# Patient Record
Sex: Female | Born: 1950 | Race: Black or African American | Hispanic: No | Smoking: Former smoker
Health system: Southern US, Community
[De-identification: ages and names within clinical notes are randomized; demographics above are authoritative.]

## PROBLEM LIST (undated history)

## (undated) DIAGNOSIS — J189 Pneumonia, unspecified organism: Secondary | ICD-10-CM

## (undated) DIAGNOSIS — K5909 Other constipation: Secondary | ICD-10-CM

## (undated) DIAGNOSIS — J45909 Unspecified asthma, uncomplicated: Secondary | ICD-10-CM

## (undated) DIAGNOSIS — I493 Ventricular premature depolarization: Secondary | ICD-10-CM

## (undated) DIAGNOSIS — E039 Hypothyroidism, unspecified: Secondary | ICD-10-CM

## (undated) DIAGNOSIS — D510 Vitamin B12 deficiency anemia due to intrinsic factor deficiency: Secondary | ICD-10-CM

## (undated) DIAGNOSIS — K219 Gastro-esophageal reflux disease without esophagitis: Secondary | ICD-10-CM

## (undated) DIAGNOSIS — I10 Essential (primary) hypertension: Secondary | ICD-10-CM

## (undated) DIAGNOSIS — I4510 Unspecified right bundle-branch block: Secondary | ICD-10-CM

## (undated) DIAGNOSIS — K759 Inflammatory liver disease, unspecified: Secondary | ICD-10-CM

## (undated) DIAGNOSIS — Z9289 Personal history of other medical treatment: Secondary | ICD-10-CM

## (undated) DIAGNOSIS — M069 Rheumatoid arthritis, unspecified: Secondary | ICD-10-CM

## (undated) DIAGNOSIS — Z9889 Other specified postprocedural states: Secondary | ICD-10-CM

## (undated) HISTORY — DX: Ventricular premature depolarization: I49.3

## (undated) HISTORY — DX: Other specified postprocedural states: Z98.890

## (undated) HISTORY — DX: Vitamin B12 deficiency anemia due to intrinsic factor deficiency: D51.0

## (undated) HISTORY — DX: Hypothyroidism, unspecified: E03.9

## (undated) HISTORY — PX: BACK SURGERY: SHX140

## (undated) HISTORY — DX: Unspecified right bundle-branch block: I45.10

## (undated) HISTORY — PX: THYROIDECTOMY: SHX17

## (undated) HISTORY — PX: ABDOMINAL HYSTERECTOMY: SHX81

## (undated) HISTORY — DX: Essential (primary) hypertension: I10

## (undated) HISTORY — DX: Other constipation: K59.09

## (undated) HISTORY — DX: Personal history of other medical treatment: Z92.89

---

## 2000-12-01 ENCOUNTER — Other Ambulatory Visit: Admission: RE | Admit: 2000-12-01 | Discharge: 2000-12-01 | Payer: Self-pay | Admitting: Specialist

## 2000-12-06 ENCOUNTER — Encounter: Payer: Self-pay | Admitting: Specialist

## 2000-12-06 ENCOUNTER — Ambulatory Visit (HOSPITAL_COMMUNITY): Admission: RE | Admit: 2000-12-06 | Discharge: 2000-12-06 | Payer: Self-pay | Admitting: *Deleted

## 2000-12-22 ENCOUNTER — Ambulatory Visit (HOSPITAL_COMMUNITY): Admission: RE | Admit: 2000-12-22 | Discharge: 2000-12-22 | Payer: Self-pay | Admitting: Specialist

## 2000-12-22 ENCOUNTER — Encounter: Payer: Self-pay | Admitting: Specialist

## 2001-01-05 ENCOUNTER — Inpatient Hospital Stay (HOSPITAL_COMMUNITY): Admission: RE | Admit: 2001-01-05 | Discharge: 2001-01-07 | Payer: Self-pay | Admitting: Specialist

## 2002-12-06 ENCOUNTER — Encounter: Payer: Self-pay | Admitting: Family Medicine

## 2002-12-06 ENCOUNTER — Ambulatory Visit (HOSPITAL_COMMUNITY): Admission: RE | Admit: 2002-12-06 | Discharge: 2002-12-06 | Payer: Self-pay | Admitting: Family Medicine

## 2002-12-18 ENCOUNTER — Ambulatory Visit (HOSPITAL_COMMUNITY): Admission: RE | Admit: 2002-12-18 | Discharge: 2002-12-18 | Payer: Self-pay | Admitting: Family Medicine

## 2002-12-18 ENCOUNTER — Encounter: Payer: Self-pay | Admitting: Family Medicine

## 2002-12-25 ENCOUNTER — Ambulatory Visit (HOSPITAL_COMMUNITY): Admission: RE | Admit: 2002-12-25 | Discharge: 2002-12-25 | Payer: Self-pay | Admitting: Internal Medicine

## 2003-05-02 ENCOUNTER — Ambulatory Visit (HOSPITAL_COMMUNITY): Admission: RE | Admit: 2003-05-02 | Discharge: 2003-05-02 | Payer: Self-pay | Admitting: Unknown Physician Specialty

## 2003-08-16 ENCOUNTER — Encounter (HOSPITAL_COMMUNITY): Admission: RE | Admit: 2003-08-16 | Discharge: 2003-09-15 | Payer: Self-pay | Admitting: Unknown Physician Specialty

## 2004-03-19 ENCOUNTER — Ambulatory Visit (HOSPITAL_COMMUNITY): Admission: RE | Admit: 2004-03-19 | Discharge: 2004-03-19 | Payer: Self-pay | Admitting: Obstetrics and Gynecology

## 2004-07-09 ENCOUNTER — Ambulatory Visit (HOSPITAL_COMMUNITY): Admission: RE | Admit: 2004-07-09 | Discharge: 2004-07-09 | Payer: Self-pay | Admitting: Family Medicine

## 2005-03-23 ENCOUNTER — Ambulatory Visit (HOSPITAL_COMMUNITY): Admission: RE | Admit: 2005-03-23 | Discharge: 2005-03-23 | Payer: Self-pay | Admitting: Obstetrics and Gynecology

## 2006-04-19 ENCOUNTER — Ambulatory Visit (HOSPITAL_COMMUNITY): Admission: RE | Admit: 2006-04-19 | Discharge: 2006-04-19 | Payer: Self-pay | Admitting: Internal Medicine

## 2007-06-01 ENCOUNTER — Ambulatory Visit (HOSPITAL_COMMUNITY): Admission: RE | Admit: 2007-06-01 | Discharge: 2007-06-01 | Payer: Self-pay | Admitting: Obstetrics and Gynecology

## 2007-08-21 ENCOUNTER — Emergency Department (HOSPITAL_COMMUNITY): Admission: EM | Admit: 2007-08-21 | Discharge: 2007-08-21 | Payer: Self-pay | Admitting: Emergency Medicine

## 2008-06-20 ENCOUNTER — Ambulatory Visit (HOSPITAL_COMMUNITY): Admission: RE | Admit: 2008-06-20 | Discharge: 2008-06-20 | Payer: Self-pay | Admitting: Obstetrics and Gynecology

## 2009-06-24 ENCOUNTER — Ambulatory Visit (HOSPITAL_COMMUNITY): Admission: RE | Admit: 2009-06-24 | Discharge: 2009-06-24 | Payer: Self-pay | Admitting: Obstetrics and Gynecology

## 2010-01-13 ENCOUNTER — Encounter (INDEPENDENT_AMBULATORY_CARE_PROVIDER_SITE_OTHER): Payer: Self-pay

## 2010-04-06 ENCOUNTER — Encounter: Payer: Self-pay | Admitting: Surgery

## 2010-04-15 NOTE — Letter (Signed)
Summary: Recall, Screening Colonoscopy Only  [facility]  [address]   [place], [place] [address]   Phone: [phone]  Fax: [phone]    January 13, 2010  Emily Valentine [address] [place], [place]  [address] 1950-05-10   Dear [name],   Our records indicate it is time to schedule your colonoscopy.    Please call our office at [phone] and ask for the nurse.   Thank you,  Hendricks Limes, LPN Cloria Spring, LPN  [facility] Ph: [phone]   Fax: [phone]

## 2010-05-19 ENCOUNTER — Other Ambulatory Visit: Payer: Self-pay | Admitting: Obstetrics and Gynecology

## 2010-05-19 DIAGNOSIS — Z139 Encounter for screening, unspecified: Secondary | ICD-10-CM

## 2010-06-30 ENCOUNTER — Ambulatory Visit (HOSPITAL_COMMUNITY): Payer: BC Managed Care – PPO

## 2010-08-01 NOTE — Op Note (Signed)
[facility]  Patient:    Emily Valentine, BRONER Visit Number: [id number] MRN: [id number]          Service Type: SUR Location: 4A [id number] [id number] Attending Physician:  Kirtland Bouchard Dictated by:   Belia Heman. Kate Sable, M.D. Proc. Date: 01/05/01 Admit Date:  01/05/2001   CC:         Christin Bach, M.D.   Operative Report  PREOPERATIVE DIAGNOSIS:  Fibroid uterus.  POSTOPERATIVE DIAGNOSIS:  Fibroid uterus.  PROCEDURE:  Total abdominal hysterectomy and bilateral salpingo-oophorectomy.  SURGEON:  Belia Heman. Kate Sable, M.D.  ASSISTANT:  Christin Bach, M.D.  ANESTHESIA:  General.  DESCRIPTION OF THE OPERATIVE PROCEDURE:  Following induction of general anesthesia, the patient was frog-legged and the vagina prepped.  A Foley catheter was placed in the bladder.  The patient was repositioned in a supine position and suitably prepped and draped for a midline incision.  The abdomen was opened through a lower abdominal midline incision.  The upper abdomen was explored with negative findings.  Exploration of the pelvis revealed an 8-week-gestational-size fibroid uterus.  The ovaries appeared atrophic and there were no lesions.  The tubes appeared normal.  The peritoneal surfaces were clean.  There was no evidence of pelvic neoplasia.  The bowel was packed off and a Balfour retractor placed.  The round ligaments were sutured with 0 chromic suture and divided bilaterally.  The uterovesical fold of peritoneum was incised transversely and the bladder flap developed with sharp dissection. The left infundibulopelvic ligament was isolated above the ureter, clamped, cut and doubly ligated with 0 chromic suture; the identical procedure was carried out on the opposite side.  The ureter was identified on both sides. The anterior and posterior leaves of the broad ligament were incised, skeletonizing the uterine vessels, which were clamped, cut and suture-ligated with 0 chromic  suture bilaterally.  The cardinal ligaments were clamped, cut and suture-ligated with 0 chromic suture medial to the uterine pedicles. Dissection was carried down to the level of the cervicovaginal junction, with each pedicle being clamped, cut and suture-ligated with 0 chromic suture bilaterally.  The vagina was entered anteriorly and the specimen severed from the vaginal cuff.  The vaginal cuff was closed vertically because of a deep cul-de-sac and a concern over future enterocele.  The uterosacral ligaments were plicated together.  The pelvis was irrigated with normal saline and small oozing areas controlled with individual sutures and cautery. Peritonealization was carried out with a running 2-0 chromic suture.  There was still a small ooze from the raw surface of the posterior closed vagina.  A piece of Surgicel was placed over this area and the bowel allowed to follow into place.  The appendix was mobilized, inspected, and not removed; the appendix appeared normal.  The peritoneum was closed with a running 2-0 chromic suture, the fascia was closed with two segmentally run 0 PDS sutures tied together in the midline, the subcutaneous layer was irrigated with normal saline and the skin closed with skin staples.  Estimated blood loss was 100 cc.  All sponge, needle and instrument counts were reported correct.  Patient tolerated the procedure well and left the operating room in satisfactory postoperative condition. Dictated by:   Belia Heman. Kate Sable, M.D. Attending Physician:  Kirtland Bouchard DD:  01/05/01 TD:  01/06/01 Job: [id number]

## 2010-08-01 NOTE — Discharge Summary (Signed)
[facility]  Patient:    Emily Valentine, Emily Valentine Visit Number: [id number] MRN: [id number]          Service Type: SUR Location: 4A A419 01 Attending Physician:  Micael Hampshire Dictated by:   Belia Heman. Kate Sable, M.D. Admit Date:  01/05/2001 Discharge Date: 01/07/2001                             Discharge Summary  HISTORY OF PRESENT ILLNESS:  This 60 year old married black female, gravida 3, para 3, was admitted with uterine fibroids, bleeding, and anemia for surgery. The details of the patients history and physical examination are documented in the typed admission note.  OPERATIVE PROCEDURES:  January 05, 2001, total abdominal hysterectomy, bilateral salpingo-oophorectomy.  At surgery an [redacted] week gestational sized fibroid uterus was found.  Pathology report:  Subserosal intramural and submucosal leiomyomata and adenomyosis, ovaries right and left, and no histologic abnormalities.  HOSPITAL COURSE:  The patients postoperative course was afebrile and uneventful.  The patient had no significant drop in her hematocrit which was 28% on the first postoperative day.  On the second postoperative day the patient was afebrile and tolerating a regular diet.  The incision showed evidence of good wound healing.  The patient had not had a bowel movement but was a given a Dulcolax suppository and discharged home.  FINAL DIAGNOSES: 1. leiomyomata uteri 2. Bleeding with anemia secondary to fibroids.  DISPOSITION:  Discharge home.  FOLLOWUP:  Office follow up in 1 week for skin staple removal.  DISCHARGE MEDICATIONS: 1. Tylox #15. 2. Motrin 800, #30. 3. Estradiol 1 mg #30. Dictated by:   Belia Heman. Kate Sable, M.D. Attending Physician:  Micael Hampshire DD:  02/23/01 TD:  02/23/01 Job: [id number]

## 2010-08-01 NOTE — Op Note (Signed)
[name], Emily Valentine                         ACCOUNT NO.:  [id number]   MEDICAL RECORD NO.:  [id number]                   PATIENT TYPE:  AMB   LOCATION:  DAY                                  FACILITY:  [facility]   PHYSICIAN:  R. Roetta Sessions, M.D.              DATE OF BIRTH:  04-May-1950   DATE OF PROCEDURE:  12/25/2002  DATE OF DISCHARGE:                                 OPERATIVE REPORT   PROCEDURE:  Colonoscopy with biopsy.   INDICATIONS FOR PROCEDURE:  The patient is a 60 year old lady sent over by  the courtesy of Corrie Mckusick, M.D. for high risk colorectal cancer  screening.  Unfortunately, her father recently succumbed to colorectal  cancer.  Prior colonoscopy was in 2001.  She had a hyperplastic polyp  removed.  No other significant findings.  Dr. Phillips Odor wishes her to have  screening colonoscopy at this time.  Aside from chronic constipation, Ms.  [name] does not really have any lower GI tract symptoms.  Colonoscopy is now  being done as a high risk screening maneuver.  This approach has been  discussed with the patient at length at bedside.  Potential risks, benefits,  and alternatives have been reviewed, questions answered.   PROCEDURE NOTE:  O2 saturations, blood pressure, pulse, and respirations  were monitored throughout the entire procedure.  Conscious sedation:  Versed  3 mg IV, Demerol 50 mg IV in divided doses.  Instrument:  Olympus video chip  adult colonoscope.   FINDINGS:  Digital rectal examination revealed no abnormalities.  The prep  was good.   RECTUM:  Examination of rectal mucosa clear to retroflexion.  The anal verge  revealed no abnormalities.   COLON:  Colonic mucosa was surveyed from the rectosigmoid junction through  the left transverse and right colon to the area of the appendiceal orifice,  ileocecal valve, and cecum.  These structures were well-seen and  photographed for the record.  Colonic mucosa appeared normal until the  appendix was  reached.  In the appendix, there was a 3 mm sessile polyp that  was cold biopsied/removed.  From the level of the cecum and the ileocecal  valve, the scope was slowly withdrawn.  All previously mentioned mucosal  surfaces were again seen, and no other abnormalities were observed.  The  patient tolerated the procedure well, was reacted in endoscopy.   IMPRESSION:  1. Normal rectum.  2. A 3 mm polyp at the appendix, otherwise normal colon.    RECOMMENDATIONS:  1. Follow up on path.  2. Further recommendations following.      ___________________________________________                                            Jonathon Bellows, M.D.   RMR/MEDQ  D:  12/25/2002  T:  12/25/2002  Job:  [id number]   cc:   Corrie Mckusick, M.D.  [address] [place] [address]  [place]  [place] [address]  Fax: [phone]

## 2010-10-08 ENCOUNTER — Other Ambulatory Visit: Payer: Self-pay | Admitting: Obstetrics and Gynecology

## 2010-10-08 DIAGNOSIS — Z139 Encounter for screening, unspecified: Secondary | ICD-10-CM

## 2010-10-10 ENCOUNTER — Ambulatory Visit (HOSPITAL_COMMUNITY): Payer: BC Managed Care – PPO

## 2010-10-10 ENCOUNTER — Ambulatory Visit (HOSPITAL_COMMUNITY)
Admission: RE | Admit: 2010-10-10 | Discharge: 2010-10-10 | Disposition: A | Discharge status: Home / Self Care | Payer: BC Managed Care – PPO | Source: Ambulatory Visit | Attending: Obstetrics and Gynecology | Admitting: Obstetrics and Gynecology

## 2010-10-10 DIAGNOSIS — Z1231 Encounter for screening mammogram for malignant neoplasm of breast: Secondary | ICD-10-CM | POA: Insufficient documentation

## 2010-10-10 DIAGNOSIS — Z139 Encounter for screening, unspecified: Secondary | ICD-10-CM

## 2010-10-14 ENCOUNTER — Ambulatory Visit (INDEPENDENT_AMBULATORY_CARE_PROVIDER_SITE_OTHER): Payer: BC Managed Care – PPO | Admitting: Gastroenterology

## 2010-10-14 ENCOUNTER — Encounter: Payer: Self-pay | Admitting: Gastroenterology

## 2010-10-14 ENCOUNTER — Other Ambulatory Visit: Payer: Self-pay | Admitting: Obstetrics and Gynecology

## 2010-10-14 DIAGNOSIS — Z1211 Encounter for screening for malignant neoplasm of colon: Secondary | ICD-10-CM

## 2010-10-14 DIAGNOSIS — R928 Other abnormal and inconclusive findings on diagnostic imaging of breast: Secondary | ICD-10-CM

## 2010-10-14 NOTE — Patient Instructions (Signed)
We have set you up for a colonoscopy.   Start following a high fiber diet. Please see handout.   Start taking a probiotic daily. You can use a product such as Digestive Advantage, Align or Restora.

## 2010-10-14 NOTE — Progress Notes (Signed)
Cc to PCP 

## 2010-10-14 NOTE — Assessment & Plan Note (Signed)
60 year old female who presents for high-risk screening colonoscopy, father with hx of colon cancer age 4. No change in bowel habits, melena, brbpr, or abdominal pain. Taking MOM as needed for constipation. Last colonoscopy in 2004 with inflammatory polyp per path report, otherwise normal. Will proceed with colonoscopy in near future.  Proceed with TCS with Dr. Jena Gauss in near future: the risks, benefits, and alternatives have been discussed with the patient in detail. The patient states understanding and desires to proceed. High fiber diet Probiotic

## 2010-10-14 NOTE — Progress Notes (Signed)
Primary Care Physician:  Cassell Smiles., MD Primary Gastroenterologist:  Dr. Jena Gauss  Chief Complaint  Patient presents with  . Colonoscopy    HPI:   [name] is a 60 year old female who presents today for high-risk screening colonoscopy. Her father passed away from colon cancer at age 22. Hx of hyperplastic polyps, most recent colonoscopy in 2004 with inflammatory polyp. She presents today without any complaints. Denies melena or brbpr. Denies abdominal pain, N/V. Reports taking MOM twice per week as needed. Denies abdominal distention. She has had no change in weight, bowel habits, or lack of appetite.    Past Medical History  Diagnosis Date  . Hypertension   . Pernicious anemia   . S/P colonoscopy 2001, 2004    2001: hyperplastic polyp, 2004: normal, inflammatory polyp    Past Surgical History  Procedure Date  . Thyroidectomy   . Abdominal hysterectomy   . Back surgery     Current Outpatient Prescriptions  Medication Sig Dispense Refill  . Cyanocobalamin (VITAMIN B-12 IJ) Inject as directed every 30 (thirty) days.        Marland Kitchen levothyroxine (SYNTHROID, LEVOTHROID) 100 MCG tablet Take 100 mcg by mouth daily.        Marland Kitchen losartan-hydrochlorothiazide (HYZAAR) 50-12.5 MG per tablet Take 1 tablet by mouth daily.        . magnesium hydroxide (MILK OF MAGNESIA) 400 MG/5ML suspension Take 30 mLs by mouth daily as needed.        Marland Kitchen VIVELLE-DOT 0.1 MG/24HR         Allergies as of 10/14/2010  . (No Known Allergies)    Family History  Problem Relation Age of Onset  . Colon cancer Father     diagnosed age 64, died at age 62  . Stomach cancer Mother     deceased    History   Social History  . Marital Status: Married    Spouse Name: N/A    Number of Children: N/A  . Years of Education: N/A   Occupational History  . Not on file.   Social History Main Topics  . Smoking status: Current Everyday Smoker -- 1.0 packs/day    Types: Cigarettes  . Smokeless tobacco: Not on file    . Alcohol Use: No  . Drug Use: No  . Sexually Active: Not on file   Other Topics Concern  . Not on file          Review of Systems: Gen: Denies any fever, chills, fatigue, weight loss, lack of appetite.  CV: Denies chest pain, heart palpitations, peripheral edema, syncope.  Resp: Denies shortness of breath at rest or with exertion. Denies wheezing or cough.  GI: Denies dysphagia or odynophagia. Denies jaundice, hematemesis, fecal incontinence. GU : Denies urinary burning, urinary frequency, urinary hesitancy MS: Denies joint pain, muscle weakness, cramps, or limitation of movement.  Derm: Denies rash, itching, dry skin Psych: Denies depression, anxiety, memory loss, and confusion Heme: Denies bruising, bleeding, and enlarged lymph nodes.  Physical Exam: BP 174/102  Pulse 84  Temp(Src) 97.2 F (36.2 C) (Temporal)  Ht 5\' 3"  (1.6 m)  Wt 140 lb 12.8 oz (63.866 kg)  BMI 24.94 kg/m2 General:   Alert and oriented. Pleasant and cooperative. Well-nourished and well-developed.  Head:  Normocephalic and atraumatic. Eyes:  Without icterus, sclera clear and conjunctiva pink.  Ears:  Normal auditory acuity. Nose:  No deformity, discharge,  or lesions. Mouth:  No deformity or lesions, oral mucosa pink.  Neck:  Supple, without mass.  Lungs:  Clear to auscultation bilaterally. No wheezes, rales, or rhonchi. No distress.  Heart:  S1, S2 present without murmurs appreciated.  Abdomen:  +BS, soft, non-tender and non-distended. No HSM noted. No guarding or rebound. No masses appreciated.  Rectal:  Deferred until colonoscopy. Msk:  Symmetrical without gross deformities. Normal posture. Extremities:  Without clubbing or edema. Neurologic:  Alert and  oriented x4;  grossly normal neurologically. Skin:  Intact without significant lesions or rashes. Cervical Nodes:  No significant cervical adenopathy. Psych:  Alert and cooperative. Normal mood and affect.

## 2010-10-22 ENCOUNTER — Other Ambulatory Visit: Payer: Self-pay | Admitting: Obstetrics and Gynecology

## 2010-10-22 ENCOUNTER — Ambulatory Visit (HOSPITAL_COMMUNITY)
Admission: RE | Admit: 2010-10-22 | Discharge: 2010-10-22 | Disposition: A | Discharge status: Home / Self Care | Payer: BC Managed Care – PPO | Source: Ambulatory Visit | Attending: Obstetrics and Gynecology | Admitting: Obstetrics and Gynecology

## 2010-10-22 DIAGNOSIS — R928 Other abnormal and inconclusive findings on diagnostic imaging of breast: Secondary | ICD-10-CM | POA: Insufficient documentation

## 2010-10-22 DIAGNOSIS — R921 Mammographic calcification found on diagnostic imaging of breast: Secondary | ICD-10-CM

## 2010-10-28 ENCOUNTER — Ambulatory Visit
Admission: RE | Admit: 2010-10-28 | Discharge: 2010-10-28 | Disposition: A | Discharge status: Home / Self Care | Payer: BC Managed Care – PPO | Source: Ambulatory Visit | Attending: Obstetrics and Gynecology | Admitting: Obstetrics and Gynecology

## 2010-10-28 DIAGNOSIS — R921 Mammographic calcification found on diagnostic imaging of breast: Secondary | ICD-10-CM

## 2010-10-29 ENCOUNTER — Inpatient Hospital Stay (HOSPITAL_COMMUNITY): Admission: RE | Admit: 2010-10-29 | Payer: BC Managed Care – PPO | Source: Ambulatory Visit

## 2010-10-29 HISTORY — PX: BREAST BIOPSY: SHX20

## 2010-11-10 ENCOUNTER — Encounter (HOSPITAL_COMMUNITY)
Admission: RE | Disposition: A | Discharge status: Home / Self Care | Payer: Self-pay | Source: Ambulatory Visit | Attending: Internal Medicine

## 2010-11-10 ENCOUNTER — Encounter: Payer: BC Managed Care – PPO | Admitting: Internal Medicine

## 2010-11-10 ENCOUNTER — Ambulatory Visit (HOSPITAL_COMMUNITY)
Admission: RE | Admit: 2010-11-10 | Discharge: 2010-11-10 | Disposition: A | Discharge status: Home / Self Care | Payer: BC Managed Care – PPO | Source: Ambulatory Visit | Attending: Internal Medicine | Admitting: Internal Medicine

## 2010-11-10 ENCOUNTER — Other Ambulatory Visit: Payer: Self-pay | Admitting: Internal Medicine

## 2010-11-10 DIAGNOSIS — Z1211 Encounter for screening for malignant neoplasm of colon: Secondary | ICD-10-CM

## 2010-11-10 DIAGNOSIS — D126 Benign neoplasm of colon, unspecified: Secondary | ICD-10-CM

## 2010-11-10 DIAGNOSIS — Z800 Family history of malignant neoplasm of digestive organs: Secondary | ICD-10-CM | POA: Insufficient documentation

## 2010-11-10 DIAGNOSIS — Z79899 Other long term (current) drug therapy: Secondary | ICD-10-CM | POA: Insufficient documentation

## 2010-11-10 DIAGNOSIS — K5730 Diverticulosis of large intestine without perforation or abscess without bleeding: Secondary | ICD-10-CM

## 2010-11-10 DIAGNOSIS — D128 Benign neoplasm of rectum: Secondary | ICD-10-CM | POA: Insufficient documentation

## 2010-11-10 HISTORY — PX: COLONOSCOPY: SHX5424

## 2010-11-10 SURGERY — COLONOSCOPY
Anesthesia: Moderate Sedation

## 2010-11-10 MED ORDER — MEPERIDINE HCL 100 MG/ML IJ SOLN
INTRAMUSCULAR | Status: DC | PRN
  Administered 2010-11-10: 25 mg via INTRAVENOUS
  Administered 2010-11-10: 50 mg via INTRAVENOUS

## 2010-11-10 MED ORDER — SODIUM CHLORIDE 0.45 % IV SOLN
Freq: Once | INTRAVENOUS | Status: AC
  Administered 2010-11-10: 08:00:00 via INTRAVENOUS

## 2010-11-10 MED ORDER — MEPERIDINE HCL 100 MG/ML IJ SOLN
INTRAMUSCULAR | Status: AC
  Filled 2010-11-10: qty 2

## 2010-11-10 MED ORDER — MIDAZOLAM HCL 5 MG/5ML IJ SOLN
INTRAMUSCULAR | Status: AC
  Filled 2010-11-10: qty 10

## 2010-11-10 MED ORDER — MIDAZOLAM HCL 5 MG/5ML IJ SOLN
INTRAMUSCULAR | Status: DC | PRN
  Administered 2010-11-10: 2 mg via INTRAVENOUS
  Administered 2010-11-10: 1 mg via INTRAVENOUS

## 2010-11-14 ENCOUNTER — Encounter (HOSPITAL_COMMUNITY): Payer: Self-pay | Admitting: Internal Medicine

## 2011-08-27 ENCOUNTER — Encounter: Payer: Self-pay | Admitting: Cardiology

## 2011-08-28 ENCOUNTER — Encounter: Payer: Self-pay | Admitting: Cardiology

## 2011-08-28 ENCOUNTER — Ambulatory Visit (INDEPENDENT_AMBULATORY_CARE_PROVIDER_SITE_OTHER): Payer: BC Managed Care – PPO | Admitting: Cardiology

## 2011-08-28 VITALS — BP 122/88 | HR 86 | Ht 62.0 in | Wt 133.0 lb

## 2011-08-28 DIAGNOSIS — I10 Essential (primary) hypertension: Secondary | ICD-10-CM

## 2011-08-28 DIAGNOSIS — Z720 Tobacco use: Secondary | ICD-10-CM

## 2011-08-28 DIAGNOSIS — I4510 Unspecified right bundle-branch block: Secondary | ICD-10-CM

## 2011-08-28 DIAGNOSIS — F17200 Nicotine dependence, unspecified, uncomplicated: Secondary | ICD-10-CM

## 2011-08-28 DIAGNOSIS — F17210 Nicotine dependence, cigarettes, uncomplicated: Secondary | ICD-10-CM | POA: Insufficient documentation

## 2011-08-28 DIAGNOSIS — Z01818 Encounter for other preprocedural examination: Secondary | ICD-10-CM

## 2011-08-28 DIAGNOSIS — R9431 Abnormal electrocardiogram [ECG] [EKG]: Secondary | ICD-10-CM

## 2011-08-28 NOTE — Progress Notes (Signed)
Reason for Consult: Abnormal EKG Referring Physician: Dr. Toni Arthurs and Dr. Elfredia Nevins  Emily Valentine is an 61 y.o. female.  HPI: This pleasant African American female is seen for the first time today.  She is being seen because of an abnormal electrocardiogram which was noted at the time of anticipated bunion surgery earlier this week.  Electrocardiogram obtained at the surgical center of [facility] on the morning of surgery showed sinus rhythm with left axis deviation and right bundle branch block.  The patient was unaware of any previous abnormal EKGs.  She does not have any history of known heart problems but does have risk factors.  She denies any substernal chest pain but has had some occasional back pain with occasional left arm radiation.  The symptoms are not typically exertional however.  The patient has also had some occasional dizzy spells and she said occasional episodes of near syncope if she has to strain at defecation.  The patient smokes a half a pack of cigarettes a day and has a smoker's cough.  She has a history of high blood pressure and hypothyroidism.  She also has pernicious anemia.  Past Medical History  Diagnosis Date  . Hypertension   . Pernicious anemia   . S/P colonoscopy 2001, 2004    2001: hyperplastic polyp, 2004: normal, inflammatory polyp    Past Surgical History  Procedure Date  . Thyroidectomy   . Abdominal hysterectomy   . Back surgery   . Colonoscopy 11/10/2010    Procedure: COLONOSCOPY;  Surgeon: Corbin Ade, MD;  Location: [facility] SUITE;  Service: Endoscopy;  Laterality: N/A;    Family History  Problem Relation Age of Onset  . Colon cancer Father     diagnosed age 62, died at age 68  . Stomach cancer Mother     deceased    Social History:  reports that she has been smoking Cigarettes.  She has been smoking about 1 pack per day. She does not have any smokeless tobacco history on file. She reports that she does not drink alcohol or use  illicit drugs.  Allergies: No Known Allergies  Medications: I have reviewed the patient's current medications.  No results found for this or any previous visit (from the past 48 hour(s)).  No results found.  Review of systems reveals that the patient pain on walking because of her bilateral bunions.  As noted she has had a smoker's cough and has had occasional episodes of dizziness and near syncope.  All other systems negative in detail.  She has had an unintentional weight loss of about 6 pounds in 6 months but no other constitutional symptoms such as fever or night sweats etc. Blood pressure 122/88, pulse 86, height 5\' 2"  (1.575 m), weight 60.328 kg (133 lb). The patient appears to be in no distress.  Head and neck exam reveals that the pupils are equal and reactive.  The extraocular movements are full.  There is no scleral icterus.  Mouth and pharynx are benign.  No lymphadenopathy.  No carotid bruits.  The jugular venous pressure is normal.  Thyroid is not enlarged or tender.  Chest is clear to percussion and auscultation.  No rales or rhonchi.  Expansion of the chest is symmetrical.  Heart reveals no abnormal lift or heave.  First and second heart sounds are normal.  There is no murmur gallop rub or click.  The abdomen is soft and nontender.  Bowel sounds are normoactive.  There is no  hepatosplenomegaly or mass.  There are no abdominal bruits.  Extremities reveal no phlebitis or edema.  Pedal pulses are good.  There is no cyanosis or clubbing.  Neurologic exam is normal strength and no lateralizing weakness.  No sensory deficits.  Integument reveals no rash  EKG done today again shows normal sinus rhythm, right bundle branch block and left axis deviation.  The QRS duration is quite wide at 164 ms  Assessment/Plan: Prior to proceeding with bunion surgery we will have the patient return for further evaluation.  She states that she has not had a chest x-ray in quite some time and  since she is a smoker we will get a chest x-ray.  We are also having her return for a Lexus scan Myoview stress test.  He would not be able to do the traditional treadmill stress test because of her bunions and also it would be difficult to interpret in the face of her right bundle branch block with secondary ST-T wave changes.  No new medication is prescribed. Many thanks for the opportunity to see this pleasant woman and we will be in touch regarding the results of her workup.  She is anxious to proceed with her bunion surgery during the summer when she is on vacation.  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  Cassell Clement 08/28/2011, 5:56 PM

## 2011-08-28 NOTE — Patient Instructions (Addendum)
Would like for you to go to and get chest xray soon, [facility] across from [facility]  Your physician recommends that you continue on your current medications as directed. Please refer to the Current Medication list given to you today.  Your physician has requested that you have a lexiscan myoview. For further information please visit [email or web address] Please follow instruction sheet, as given.  Follow up as needed

## 2011-08-31 ENCOUNTER — Encounter: Payer: Self-pay | Admitting: Cardiology

## 2011-09-01 ENCOUNTER — Ambulatory Visit (INDEPENDENT_AMBULATORY_CARE_PROVIDER_SITE_OTHER)
Admission: RE | Admit: 2011-09-01 | Discharge: 2011-09-01 | Disposition: A | Discharge status: Home / Self Care | Payer: BC Managed Care – PPO | Source: Ambulatory Visit | Attending: Cardiology | Admitting: Cardiology

## 2011-09-01 ENCOUNTER — Ambulatory Visit (HOSPITAL_COMMUNITY): Payer: BC Managed Care – PPO | Attending: Cardiovascular Disease | Admitting: Radiology

## 2011-09-01 VITALS — BP 144/94 | HR 74 | Ht 62.0 in | Wt 131.0 lb

## 2011-09-01 DIAGNOSIS — I10 Essential (primary) hypertension: Secondary | ICD-10-CM

## 2011-09-01 DIAGNOSIS — R9431 Abnormal electrocardiogram [ECG] [EKG]: Secondary | ICD-10-CM

## 2011-09-01 DIAGNOSIS — F17200 Nicotine dependence, unspecified, uncomplicated: Secondary | ICD-10-CM | POA: Insufficient documentation

## 2011-09-01 DIAGNOSIS — M549 Dorsalgia, unspecified: Secondary | ICD-10-CM | POA: Insufficient documentation

## 2011-09-01 DIAGNOSIS — Z01818 Encounter for other preprocedural examination: Secondary | ICD-10-CM

## 2011-09-01 DIAGNOSIS — R42 Dizziness and giddiness: Secondary | ICD-10-CM | POA: Insufficient documentation

## 2011-09-01 DIAGNOSIS — I4510 Unspecified right bundle-branch block: Secondary | ICD-10-CM | POA: Insufficient documentation

## 2011-09-01 DIAGNOSIS — M79609 Pain in unspecified limb: Secondary | ICD-10-CM | POA: Insufficient documentation

## 2011-09-01 MED ORDER — TECHNETIUM TC 99M TETROFOSMIN IV KIT
11.0000 | PACK | Freq: Once | INTRAVENOUS | Status: AC | PRN
  Administered 2011-09-01: 11 via INTRAVENOUS

## 2011-09-01 MED ORDER — REGADENOSON 0.4 MG/5ML IV SOLN
0.4000 mg | Freq: Once | INTRAVENOUS | Status: AC
  Administered 2011-09-01: 0.4 mg via INTRAVENOUS

## 2011-09-01 MED ORDER — TECHNETIUM TC 99M TETROFOSMIN IV KIT
33.0000 | PACK | Freq: Once | INTRAVENOUS | Status: AC | PRN
  Administered 2011-09-01: 33 via INTRAVENOUS

## 2011-09-01 NOTE — Progress Notes (Signed)
[facility] SITE 3 NUCLEAR MED [address] [place] [address] [phone]  Cardiology Nuclear Med Study  Emily Valentine is a 61 y.o. female     MRN : [id number]     DOB: 01-01-1951  Procedure Date: 09/01/2011  Nuclear Med Background Indication for Stress Test:  Evaluation for Ischemia, Pending Clearance for (B) Foot Surgery by Dr. Toni Arthurs and Abnormal EKG History:  No previous documented CAD Cardiac Risk Factors: Hypertension, RBBB and Smoker  Symptoms:  Dizziness and occasional Back/(L) Arm Pain/Heaviness with Lifting   Nuclear Pre-Procedure Caffeine/Decaff Intake:  None NPO After: 9:00pm   Lungs:  clear O2 Sat: 98% on room air. IV 0.9% NS with Angio Cath:  22g  IV Site: R Hand  IV Started by:  Cathlyn Parsons, RN  Chest Size (in):  36 Cup Size: B  Height: 5\' 2"  (1.575 m)  Weight:  131 lb (59.421 kg)  BMI:  Body mass index is 23.96 kg/(m^2). Tech Comments:  AM med's. taken    Nuclear Med Study 1 or 2 day study: 1 day  Stress Test Type:  Treadmill/Lexiscan  Reading MD: Kristeen Miss, MD  Order Authorizing Provider:  Cassell Clement, MD  Resting Radionuclide: Technetium 6m Tetrofosmin  Resting Radionuclide Dose: 11.0 mCi   Stress Radionuclide:  Technetium 55m Tetrofosmin  Stress Radionuclide Dose: 33.0 mCi           Stress Protocol Rest HR: 74 Stress HR: 115  Rest BP: 144/94 Stress BP: 150/83  Exercise Time (min): 2:00 METS: n/a   Predicted Max HR: 159 bpm % Max HR: 72.33 bpm Rate Pressure Product: 78469   Dose of Adenosine (mg):  n/a Dose of Lexiscan: 0.4 mg  Dose of Atropine (mg): n/a Dose of Dobutamine: n/a mcg/kg/min (at max HR)  Stress Test Technologist: Smiley Houseman, CMA-N  Nuclear Technologist:  Domenic Polite, CNMT     Rest Procedure:  Myocardial perfusion imaging was performed at rest 45 minutes following the intravenous administration of Technetium 88m Tetrofosmin.  Rest ECG: RBBB, nonspecific ST-T wave changes and  occasional PVC's.  Stress Procedure:  The patient received IV Lexiscan 0.4 mg over 15-seconds with concurrent low level exercise and then Technetium 48m Tetrofosmin was injected at 30-seconds while the patient continued walking one more minute. There were no significant changes with Lexiscan bolus, occasional PVC's were noted. Quantitative spect images were obtained after a 45-minute delay.  Stress ECG: No significant change from baseline ECG  QPS Raw Data Images:  Normal; no motion artifact; normal heart/lung ratio. Stress Images:  Normal homogeneous uptake in all areas of the myocardium. Rest Images:  Normal homogeneous uptake in all areas of the myocardium. Subtraction (SDS):  No evidence of ischemia. Transient Ischemic Dilatation (Normal <1.22):  1.06 Lung/Heart Ratio (Normal <0.45):  0.27  Quantitative Gated Spect Images QGS EDV:  65 ml QGS ESV:  25 ml  Impression Exercise Capacity:  Lexiscan with low level exercise. BP Response:  Normal blood pressure response. Clinical Symptoms:  No significant symptoms noted. ECG Impression:  No significant ST segment change suggestive of ischemia. Comparison with Prior Nuclear Study: No previous nuclear study performed  Overall Impression:  Normal stress nuclear study.  No evidence of ischemia.  Normal LV function.  LV Ejection Fraction: 63%.  LV Wall Motion:  NL LV Function; NL Wall Motion.    Vesta Mixer, Montez Hageman., MD, [facility] 09/01/2011, 4:56 PM Office - [phone] Pager [phone]

## 2011-09-25 ENCOUNTER — Other Ambulatory Visit: Payer: Self-pay | Admitting: Physician Assistant

## 2011-09-25 DIAGNOSIS — Z0000 Encounter for general adult medical examination without abnormal findings: Secondary | ICD-10-CM

## 2011-09-25 DIAGNOSIS — Z780 Asymptomatic menopausal state: Secondary | ICD-10-CM

## 2011-09-29 ENCOUNTER — Other Ambulatory Visit: Payer: Self-pay | Admitting: Obstetrics and Gynecology

## 2011-09-29 DIAGNOSIS — Z1231 Encounter for screening mammogram for malignant neoplasm of breast: Secondary | ICD-10-CM

## 2011-10-14 ENCOUNTER — Other Ambulatory Visit: Payer: Self-pay

## 2011-10-16 ENCOUNTER — Ambulatory Visit: Payer: Self-pay

## 2011-10-16 ENCOUNTER — Other Ambulatory Visit: Payer: Self-pay

## 2011-10-30 ENCOUNTER — Ambulatory Visit
Admission: RE | Admit: 2011-10-30 | Discharge: 2011-10-30 | Disposition: A | Discharge status: Home / Self Care | Payer: BC Managed Care – PPO | Source: Ambulatory Visit | Attending: Obstetrics and Gynecology | Admitting: Obstetrics and Gynecology

## 2011-10-30 ENCOUNTER — Ambulatory Visit
Admission: RE | Admit: 2011-10-30 | Discharge: 2011-10-30 | Disposition: A | Discharge status: Home / Self Care | Payer: BC Managed Care – PPO | Source: Ambulatory Visit | Attending: Physician Assistant | Admitting: Physician Assistant

## 2011-10-30 DIAGNOSIS — Z780 Asymptomatic menopausal state: Secondary | ICD-10-CM

## 2011-10-30 DIAGNOSIS — Z1231 Encounter for screening mammogram for malignant neoplasm of breast: Secondary | ICD-10-CM

## 2012-06-20 ENCOUNTER — Ambulatory Visit (HOSPITAL_COMMUNITY)
Admission: RE | Admit: 2012-06-20 | Discharge: 2012-06-20 | Disposition: A | Discharge status: Home / Self Care | Payer: BC Managed Care – PPO | Source: Ambulatory Visit | Attending: Family Medicine | Admitting: Family Medicine

## 2012-06-20 ENCOUNTER — Other Ambulatory Visit (HOSPITAL_COMMUNITY): Payer: Self-pay | Admitting: Family Medicine

## 2012-06-20 DIAGNOSIS — M79609 Pain in unspecified limb: Secondary | ICD-10-CM | POA: Insufficient documentation

## 2012-06-20 DIAGNOSIS — M19049 Primary osteoarthritis, unspecified hand: Secondary | ICD-10-CM | POA: Insufficient documentation

## 2012-06-20 DIAGNOSIS — M7989 Other specified soft tissue disorders: Secondary | ICD-10-CM | POA: Insufficient documentation

## 2012-08-19 ENCOUNTER — Other Ambulatory Visit: Payer: Self-pay | Admitting: Obstetrics and Gynecology

## 2012-09-30 ENCOUNTER — Other Ambulatory Visit: Payer: Self-pay

## 2012-09-30 DIAGNOSIS — Z1231 Encounter for screening mammogram for malignant neoplasm of breast: Secondary | ICD-10-CM

## 2012-10-31 ENCOUNTER — Ambulatory Visit
Admission: RE | Admit: 2012-10-31 | Discharge: 2012-10-31 | Disposition: A | Discharge status: Home / Self Care | Payer: BC Managed Care – PPO | Source: Ambulatory Visit

## 2012-10-31 DIAGNOSIS — Z1231 Encounter for screening mammogram for malignant neoplasm of breast: Secondary | ICD-10-CM

## 2013-08-25 ENCOUNTER — Other Ambulatory Visit: Payer: Self-pay | Admitting: Obstetrics and Gynecology

## 2013-10-04 ENCOUNTER — Ambulatory Visit (INDEPENDENT_AMBULATORY_CARE_PROVIDER_SITE_OTHER): Payer: BC Managed Care – PPO | Admitting: Physician Assistant

## 2013-10-04 ENCOUNTER — Encounter: Payer: Self-pay | Admitting: General Surgery

## 2013-10-04 ENCOUNTER — Encounter: Payer: Self-pay | Admitting: Physician Assistant

## 2013-10-04 ENCOUNTER — Telehealth: Payer: Self-pay | Admitting: Physician Assistant

## 2013-10-04 VITALS — BP 122/80 | HR 90 | Ht 62.0 in | Wt 131.8 lb

## 2013-10-04 DIAGNOSIS — R0602 Shortness of breath: Secondary | ICD-10-CM

## 2013-10-04 DIAGNOSIS — N189 Chronic kidney disease, unspecified: Secondary | ICD-10-CM

## 2013-10-04 DIAGNOSIS — R079 Chest pain, unspecified: Secondary | ICD-10-CM

## 2013-10-04 DIAGNOSIS — I10 Essential (primary) hypertension: Secondary | ICD-10-CM

## 2013-10-04 DIAGNOSIS — R002 Palpitations: Secondary | ICD-10-CM

## 2013-10-04 MED ORDER — NITROGLYCERIN 0.4 MG SL SUBL: 0.4000 mg | SUBLINGUAL_TABLET | SUBLINGUAL | Status: DC | PRN

## 2013-10-04 MED ORDER — ISOSORBIDE MONONITRATE ER 30 MG PO TB24: 30.0000 mg | ORAL_TABLET | Freq: Every day | ORAL | Status: DC

## 2013-10-04 MED ORDER — ASPIRIN EC 81 MG PO TBEC: 81.0000 mg | DELAYED_RELEASE_TABLET | Freq: Every day | ORAL | Status: DC

## 2013-10-04 NOTE — Telephone Encounter (Signed)
ROI faxed to [facility] @[phone] 7.22.15/km

## 2013-10-04 NOTE — Progress Notes (Addendum)
Cardiology Office Note    Date:  10/04/2013   ID:  Emily Valentine, DOB 11-17-1950, MRN [id number]  PCP:  Purvis Kilts, MD  Cardiologist:  Dr. Darlin Coco      History of Present Illness: Emily Valentine is a 63 y.o. female with a history of hypertension, hypothyroidism, RA, CKD. She was seen by Dr. Mare Ferrari in 2013 for an abnormal ECG. Nuclear stress test was performed. This was low risk with no ischemia.  Patient was recently seen by primary care for palpitations and referred back for evaluation.    She actually notes symptoms of chest tightness and dyspnea, mainly with exertion, for the last few months.  It seems to be getting worse.  She also notes it at rest with certain positional changes.  She denies radiating symptoms.  She denies syncope, orthopnea, PND, edema.    Studies:  - Nuclear (09/01/11):  No ischemia, EF 53%; normal study    Recent Labs: No results found for requested labs within last 365 days. 10/02/13: Potassium 3.8, creatinine 2.2, Hgb 12.4   Wt Readings from Last 3 Encounters:  09/01/11 131 lb (59.421 kg)  08/28/11 133 lb (60.328 kg)  11/10/10 138 lb (62.596 kg)     Past Medical History  Diagnosis Date  . Hypertension   . Pernicious anemia   . S/P colonoscopy 2001, 2004    2001: hyperplastic polyp, 2004: normal, inflammatory polyp  . Hypothyroid     Current Outpatient Prescriptions  Medication Sig Dispense Refill  . amLODipine (NORVASC) 5 MG tablet Take 5 mg by mouth daily.      . Cholecalciferol (VITAMIN D3 PO) Take by mouth. Taking 2000 daily      . Cyanocobalamin (VITAMIN B-12 IJ) Inject as directed every 30 (thirty) days.        . folic acid (FOLVITE) 1 MG tablet Take 1 mg by mouth daily.      Marland Kitchen ibuprofen (ADVIL,MOTRIN) 200 MG tablet Take 200 mg by mouth every 6 (six) hours as needed.      Marland Kitchen levothyroxine (SYNTHROID, LEVOTHROID) 100 MCG tablet Take 100 mcg by mouth daily.        Marland Kitchen losartan-hydrochlorothiazide (HYZAAR) 50-12.5 MG  per tablet Take by mouth daily. Taking 100/25mg  daily      . magnesium hydroxide (MILK OF MAGNESIA) 400 MG/5ML suspension Take 30 mLs by mouth daily as needed.        . meloxicam (MOBIC) 15 MG tablet Take 15 mg by mouth daily.      . methotrexate (50 MG/ML) 1 G injection Inject into the vein.      . Multiple Vitamin (MULTIVITAMIN) tablet Take 1 tablet by mouth daily.      . Omega-3 Fatty Acids (FISH OIL PO) Take by mouth. Taking 1000 2 daily      . traMADol (ULTRAM) 50 MG tablet Take by mouth every 6 (six) hours as needed.      Marland Kitchen VIVELLE-DOT 0.1 MG/24HR patch APPLY AS DIRECTED.  8 patch  12   No current facility-administered medications for this visit.    Allergies:   Oxycontin   Social History:  The patient  reports that she has been smoking Cigarettes.  She has been smoking about 1.00 pack per day. She does not have any smokeless tobacco history on file. She reports that she does not drink alcohol or use illicit drugs.   Family History:  The patient's family history includes Colon cancer in her father; Stomach cancer in  her mother.   ROS:  Please see the history of present illness.   She has a chronic NP cough. She can hear her heartbeat at times.  All other systems reviewed and negative.   PHYSICAL EXAM: VS:  BP 122/80  Pulse 90  Ht 5\' 2"  (1.575 m)  Wt 131 lb 12.8 oz (59.784 kg)  BMI 24.10 kg/m2 Well nourished, well developed, in no acute distress HEENT: normal Neck: no JVD Vascular:  No carotid bruits Cardiac:  normal S1, S2; RRR; no murmur Lungs:  clear to auscultation bilaterally, no wheezing, rhonchi or rales Abd: soft, nontender, no hepatomegaly Ext: no edema Skin: warm and dry Neuro:  CNs 2-12 intact, no focal abnormalities noted  EKG:  NSR, HR 90, left axis deviation, RBBB, no change from prior tracing     ASSESSMENT AND PLAN:  Chest pain, unspecified Symptoms are highly suggestive of exertional angina (CCS Class 3).  She has risk factors of ongoing tobacco abuse  and HTN.  She does have an elevated creatinine.  I am not aware of the chronicity of her CKD. She would be at higher risk of CIN with cardiac cath.  I will request recent labs from her rheumatologist (Dr. Ouida Sills).  I will start her on a trial of medical Rx.  I will start Imdur 30 QD.  Give Rx for NTG prn.  Start ASA 81 QD.  Check Lipids.  Arrange ETT-Myoview.  Arrange echo.  Return for early follow up.  She may ultimately need cardiac cath. I reviewed her case today with Dr. Mare Ferrari who agreed.  Shortness of breath Arrange echo and Myoview as noted above.   Essential hypertension Controlled.   CKD (chronic kidney disease), unspecified stage  Obtain prior labs. Addendum (10/13/2013 11:58 AM):  Received labs from PCP - 09/29/13 Creatinine 2.2; 05/30/13 Creatinine 1.1; 02/06/13 Creatinine 1.1  Disposition:  Follow up with me or Dr. Mare Ferrari in 2 weeks.   Signed, Versie Starks, MHS 10/04/2013 9:49 AM    [facility] [place], [place], [place]  [address] Phone: [phone]; Fax: [phone]

## 2013-10-04 NOTE — Patient Instructions (Signed)
Your physician has recommended you make the following change in your medication: 1. Start Imdur 30 MG 1 tablet daily 2. Start Nitroglycerin as needed 3. Start Aspirin 91 MG 1 tablet daily  Your physician recommends that you return for a FASTING lipid profile when you have your Myoview  Your physician has requested that you have an exercise stress myoview. For further information please visit [email or web address]. Please follow instruction sheet, as given.  Your physician has requested that you have an echocardiogram. Echocardiography is a painless test that uses sound waves to create images of your heart. It provides your doctor with information about the size and shape of your heart and how well your heart's chambers and valves are working. This procedure takes approximately one hour. There are no restrictions for this procedure.  Your physician recommends that you schedule a follow-up appointment in: 1-2 weeks with Dr Mare Ferrari or Nicki Reaper on a day that Dr Mare Ferrari is here.

## 2013-10-10 ENCOUNTER — Other Ambulatory Visit: Payer: Self-pay

## 2013-10-10 DIAGNOSIS — Z1231 Encounter for screening mammogram for malignant neoplasm of breast: Secondary | ICD-10-CM

## 2013-10-16 ENCOUNTER — Ambulatory Visit (HOSPITAL_COMMUNITY): Payer: BC Managed Care – PPO | Attending: Cardiology

## 2013-10-16 ENCOUNTER — Encounter: Payer: Self-pay | Admitting: Physician Assistant

## 2013-10-16 ENCOUNTER — Ambulatory Visit (HOSPITAL_BASED_OUTPATIENT_CLINIC_OR_DEPARTMENT_OTHER): Payer: BC Managed Care – PPO | Admitting: Radiology

## 2013-10-16 ENCOUNTER — Other Ambulatory Visit (INDEPENDENT_AMBULATORY_CARE_PROVIDER_SITE_OTHER): Payer: BC Managed Care – PPO

## 2013-10-16 ENCOUNTER — Telehealth: Payer: Self-pay | Admitting: *Deleted

## 2013-10-16 VITALS — BP 101/71 | Ht 62.0 in | Wt 127.0 lb

## 2013-10-16 DIAGNOSIS — R0789 Other chest pain: Secondary | ICD-10-CM

## 2013-10-16 DIAGNOSIS — R079 Chest pain, unspecified: Secondary | ICD-10-CM

## 2013-10-16 DIAGNOSIS — F17200 Nicotine dependence, unspecified, uncomplicated: Secondary | ICD-10-CM | POA: Insufficient documentation

## 2013-10-16 DIAGNOSIS — I4949 Other premature depolarization: Secondary | ICD-10-CM

## 2013-10-16 DIAGNOSIS — E039 Hypothyroidism, unspecified: Secondary | ICD-10-CM | POA: Insufficient documentation

## 2013-10-16 DIAGNOSIS — R0602 Shortness of breath: Secondary | ICD-10-CM

## 2013-10-16 DIAGNOSIS — R0609 Other forms of dyspnea: Secondary | ICD-10-CM

## 2013-10-16 DIAGNOSIS — R0989 Other specified symptoms and signs involving the circulatory and respiratory systems: Secondary | ICD-10-CM

## 2013-10-16 DIAGNOSIS — I129 Hypertensive chronic kidney disease with stage 1 through stage 4 chronic kidney disease, or unspecified chronic kidney disease: Secondary | ICD-10-CM | POA: Insufficient documentation

## 2013-10-16 DIAGNOSIS — M069 Rheumatoid arthritis, unspecified: Secondary | ICD-10-CM | POA: Insufficient documentation

## 2013-10-16 DIAGNOSIS — I4510 Unspecified right bundle-branch block: Secondary | ICD-10-CM | POA: Insufficient documentation

## 2013-10-16 DIAGNOSIS — N189 Chronic kidney disease, unspecified: Secondary | ICD-10-CM | POA: Insufficient documentation

## 2013-10-16 LAB — LIPID PANEL
CHOL/HDL RATIO: 4
Cholesterol: 151 mg/dL (ref 0–200)
HDL: 37.5 mg/dL — AB (ref >39.00)
LDL Cholesterol: 90 mg/dL (ref 0–99)
NonHDL: 113.5
Triglycerides: 116 mg/dL (ref 0.0–149.0)
VLDL: 23.2 mg/dL (ref 0.0–40.0)

## 2013-10-16 MED ORDER — TECHNETIUM TC 99M SESTAMIBI GENERIC - CARDIOLITE
10.0000 | Freq: Once | INTRAVENOUS | Status: AC | PRN
  Administered 2013-10-16: 10 via INTRAVENOUS

## 2013-10-16 MED ORDER — TECHNETIUM TC 99M SESTAMIBI GENERIC - CARDIOLITE
30.0000 | Freq: Once | INTRAVENOUS | Status: AC | PRN
  Administered 2013-10-16: 30 via INTRAVENOUS

## 2013-10-16 NOTE — Progress Notes (Signed)
2D Echo completed. 10/16/2013

## 2013-10-16 NOTE — Telephone Encounter (Signed)
lmptcb for test results. Pt has appt 8/4 w/Dr. Mare Ferrari at 3:15.

## 2013-10-16 NOTE — Progress Notes (Signed)
[facility] [address] [place], [place] [address] [phone]    Cardiology Nuclear Med Study  Emily Valentine is a 63 y.o. female     MRN : [id number]     DOB: August 22, 1950  Procedure Date: 10/16/2013  Nuclear Med Background Indication for Stress Test:  Evaluation for Ischemia History:  No H/O CAD, 2013 MPI: EF: 53% NL ECHO: [facility]  Cardiac Risk Factors: Family History - CAD and Hypertension  Symptoms:  Chest Tightness, DOE and Palpitations   Nuclear Pre-Procedure Caffeine/Decaff Intake:  None NPO After: 7:00pm   Lungs:  clear O2 Sat: 98% on room air. IV 0.9% NS with Angio Cath:  22g  IV Site: L Antecubital  IV Started by:  Irven Baltimore, RN  Chest Size (in):  36 Cup Size: C  Height: 5\' 2"  (1.575 m)  Weight:  127 lb (57.607 kg)  BMI:  Body mass index is 23.22 kg/(m^2). Tech Comments:  This patient has a lot of ventricular ectopy at rest.    Nuclear Med Study 1 or 2 day study: 1 day  Stress Test Type:  Stress  Reading MD: n/a  Order Authorizing Provider:  Henrene Dodge and Nicki Reaper [facility]  Resting Radionuclide: Technetium 11m Sestamibi  Resting Radionuclide Dose: 11.0 mCi   Stress Radionuclide:  Technetium 49m Sestamibi  Stress Radionuclide Dose: 33.0 mCi           Stress Protocol Rest HR: 90 Stress HR: 133  Rest BP: 101/71 Stress BP: 149/78  Exercise Time (min): 5:00 METS: 6.40   Predicted Max HR: 157 bpm % Max HR: 84.71 bpm Rate Pressure Product: 19817   Dose of Adenosine (mg):  n/a Dose of Lexiscan: 0.4 mg  Dose of Atropine (mg): n/a Dose of Dobutamine: n/a mcg/kg/min (at max HR)  Stress Test Technologist: Perrin Maltese, EMT-P  Nuclear Technologist:  Annye Rusk, CNMT     Rest Procedure:  Myocardial perfusion imaging was performed at rest 45 minutes following the intravenous administration of Technetium 3m Sestamibi. Rest ECG: SR ICRBBB baseline ST/T wave changes PAC;s and PVC;s   Stress Procedure:  The  patient exercised on the treadmill utilizing the Bruce Protocol for 5:00 minutes. The patient stopped due to fatigue, weakness, sob, and denied any chest pain.  Technetium 53m Sestamibi was injected at peak exercise and myocardial perfusion imaging was performed after a brief delay. Stress ECG: ST depression in recovery but nondiagnositic due to baseline changes  QPS Raw Data Images:  Patient motion noted. Stress Images:  Normal homogeneous uptake in all areas of the myocardium. Rest Images:  Normal homogeneous uptake in all areas of the myocardium. Subtraction (SDS):  Normal Transient Ischemic Dilatation (Normal <1.22):  1.13 Lung/Heart Ratio (Normal <0.45):  0.44  Quantitative Gated Spect Images QGS EDV:  46 ml QGS ESV:  20 ml  Impression Exercise Capacity:  Fair exercise capacity. BP Response:  Normal blood pressure response. Clinical Symptoms:  There is dyspnea. ECG Impression:  Non diagnostic due to baseline changes  ST depression in recover Comparison with Prior Nuclear Study: No images to compare  Overall Impression:  Normal stress nuclear study. Baseline abnormal ECG and nondiagnostic with stress  LV Ejection Fraction: 56%.  LV Wall Motion:  NL LV Function; NL Wall Motion   Jenkins Rouge

## 2013-10-17 ENCOUNTER — Encounter: Payer: Self-pay | Admitting: Cardiology

## 2013-10-17 ENCOUNTER — Ambulatory Visit (INDEPENDENT_AMBULATORY_CARE_PROVIDER_SITE_OTHER): Payer: BC Managed Care – PPO | Admitting: Cardiology

## 2013-10-17 VITALS — BP 115/80 | HR 86 | Ht 62.0 in | Wt 130.0 lb

## 2013-10-17 DIAGNOSIS — Z720 Tobacco use: Secondary | ICD-10-CM

## 2013-10-17 DIAGNOSIS — I10 Essential (primary) hypertension: Secondary | ICD-10-CM

## 2013-10-17 DIAGNOSIS — I4949 Other premature depolarization: Secondary | ICD-10-CM

## 2013-10-17 DIAGNOSIS — I493 Ventricular premature depolarization: Secondary | ICD-10-CM | POA: Insufficient documentation

## 2013-10-17 DIAGNOSIS — F17200 Nicotine dependence, unspecified, uncomplicated: Secondary | ICD-10-CM

## 2013-10-17 MED ORDER — METOPROLOL SUCCINATE ER 25 MG PO TB24: ORAL_TABLET | ORAL | Status: DC

## 2013-10-17 NOTE — Progress Notes (Signed)
Emily Valentine Date of Birth:  1950/12/30 [place] [address] [place], [place]  [address] [phone]        Fax   [phone]   History of Present Illness: This pleasant 63 year old [place] American woman is seen for a followup office visit.  I had last seen her in June 2013.  She was initially sent to [place] because of an abnormal EKG showing right bundle branch block with left axis deviation.  He evaluated her at that time with a Lexa scan Myoview which was normal and we were able to  clear her for podiatry surgery.  She was lost to followup until recently when she was seen by Richardson Dopp PA C. on 10/04/13 for complaints of chest tightness and exertional dyspnea.  She underwent a echocardiogram as well as a treadmill Myoview, both studies on 10/16/13.  The Myoview showed no evidence of ischemia and her ejection fraction was 56%.  The echocardiogram showed an ejection fraction of 65-70% with vigorous contraction and no significant valve abnormalities.  Current Outpatient Prescriptions  Medication Sig Dispense Refill  . aspirin EC 81 MG tablet Take 1 tablet (81 mg total) by mouth daily.      . Cholecalciferol (VITAMIN D3 PO) Take by mouth. Taking 2000 daily      . Cyanocobalamin (VITAMIN B-12 IJ) Inject as directed every 30 (thirty) days.        . folic acid (FOLVITE) 1 MG tablet Take 1 mg by mouth daily.      Marland Kitchen ibuprofen (ADVIL,MOTRIN) 200 MG tablet Take 200 mg by mouth every 6 (six) hours as needed.      Marland Kitchen levothyroxine (SYNTHROID, LEVOTHROID) 100 MCG tablet Take 100 mcg by mouth daily.        Marland Kitchen losartan-hydrochlorothiazide (HYZAAR) 50-12.5 MG per tablet Take by mouth daily. Taking 100/25mg  daily      . magnesium hydroxide (MILK OF MAGNESIA) 400 MG/5ML suspension Take 30 mLs by mouth daily as needed.        . meloxicam (MOBIC) 15 MG tablet Take 15 mg by mouth daily.      . methotrexate (50 MG/ML) 1 G injection Inject into the vein.      . Multiple Vitamin  (MULTIVITAMIN) tablet Take 1 tablet by mouth daily.      . nitroGLYCERIN (NITROSTAT) 0.4 MG SL tablet Place 1 tablet (0.4 mg total) under the tongue every 5 (five) minutes as needed for chest pain.  25 tablet  3  . Omega-3 Fatty Acids (FISH OIL PO) Take by mouth. Taking 1000 2 daily      . traMADol (ULTRAM) 50 MG tablet Take by mouth every 6 (six) hours as needed.      Marland Kitchen VIVELLE-DOT 0.1 MG/24HR patch APPLY AS DIRECTED.  8 patch  12  . metoprolol succinate (TOPROL XL) 25 MG 24 hr tablet 1/2 TABLET DAILY  15 tablet  5   No current facility-administered medications for this visit.    Allergies  Allergen Reactions  . Oxycontin [Oxycodone] Nausea And Vomiting    Patient Active Problem List   Diagnosis Date Noted  . 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 Palpitations 10/04/2013  . Right bundle branch block 08/28/2011  . Tobacco abuse 08/28/2011  . Essential hypertension 08/28/2011  . Encounter for screening colonoscopy 10/14/2010    History  Smoking status  . Current Every Day Smoker -- 1.00 packs/day  . Types: Cigarettes  Smokeless tobacco  . Not on file  History  Alcohol Use No    Family History  Problem Relation Age of Onset  . Colon cancer Father     diagnosed age 59, died at age 50  . Stomach cancer Mother     deceased  . Anemia Father   . Anemia Sister   . Anemia Mother   . Cancer Mother   . Cancer Father   . Cancer Brother   . Diabetes Sister   . Hypertension Father   . Hypertension Mother   . Hypertension Sister   . Hypertension Brother   . Thyroid disease Mother   . Thyroid disease Brother   . Thyroid disease Sister     Review of Systems: Constitutional: no fever chills diaphoresis or fatigue or change in weight.  Head and neck: no hearing loss, no epistaxis, no photophobia or visual disturbance. Respiratory: No cough, shortness of breath or wheezing. Cardiovascular: No chest pain peripheral edema, palpitations. Gastrointestinal: No abdominal distention, no  abdominal pain, no change in bowel habits hematochezia or melena. Genitourinary: No dysuria, no frequency, no urgency, no nocturia. Musculoskeletal:No arthralgias, no back pain, no gait disturbance or myalgias. Neurological: No dizziness, no headaches, no numbness, no seizures, no syncope, no weakness, no tremors. Hematologic: No lymphadenopathy, no easy bruising. Psychiatric: No confusion, no hallucinations, no sleep disturbance.    Physical Exam: Filed Vitals:   10/17/13 1515  BP: 115/80  Pulse: 86   general appearance reveals a well-developed well-nourished woman in no distress.The head and neck exam reveals pupils equal and reactive.  Extraocular movements are full.  There is no scleral icterus.  The mouth and pharynx are normal.  The neck is supple.  The carotids reveal no bruits.  The jugular venous pressure is normal.  The  thyroid is not enlarged.  There is no lymphadenopathy.  The chest is clear to percussion and auscultation.  There are no rales or rhonchi.  Expansion of the chest is symmetrical.  The precordium is quiet.  Frequent premature beats are noted on auscultation.  The first heart sound is normal.  The second heart sound is physiologically split.  There is no murmur gallop rub or click.  There is no abnormal lift or heave.  The abdomen is soft and nontender.  The bowel sounds are normal.  The liver and spleen are not enlarged.  There are no abdominal masses.  There are no abdominal bruits.  Extremities reveal good pedal pulses.  There is no phlebitis or edema.  There is no cyanosis or clubbing.  Strength is normal and symmetrical in all extremities.  There is no lateralizing weakness.  There are no sensory deficits.  The skin is warm and dry.  There is no rash.     Assessment / Plan: 1. essential hypertension 2. noncardiac chest pain.  Recent normal treadmill Myoview on 10/16/13 shows no ischemia 3. frequent PVCs which may be contributing to her sense of chest tightness 4.  tobacco abuse  Disposition: We are going to start low-dose Toprol-XL 12.5 mg one daily and.  Weight.  This should help her PVCs and her echocardiogram showed borderline excessive contractility which will be helped by the beta blocker. To make room for the beta blocker we will stop her amlodipine and stop her Imdur The patient was counseled about the importance of stopping smoking Recheck in 3 months for office visit

## 2013-10-17 NOTE — Assessment & Plan Note (Signed)
The patient has prior hypertension.  Recently with the addition of additional medication her blood pressures have been in the low normal range.

## 2013-10-17 NOTE — Patient Instructions (Addendum)
STOP AMLODIPINE   STOP IMDUR (ISOSORBIDE)  START TOPROL XL (METORPOLOL) 25 MG 1/2 TABLET DAILY, RX SENT TO [place]  Your physician recommends that you schedule a follow-up appointment in: 3 MONTH OV   Smoking Cessation Quitting smoking is important to your health and has many advantages. However, it is not always easy to quit since nicotine is a very addictive drug. Oftentimes, people try 3 times or more before being able to quit. This document explains the best ways for you to prepare to quit smoking. Quitting takes hard work and a lot of effort, but you can do it. ADVANTAGES OF QUITTING SMOKING  You will live longer, feel better, and live better.  Your body will feel the impact of quitting smoking almost immediately.  Within 20 minutes, blood pressure decreases. Your pulse returns to its normal level.  After 8 hours, carbon monoxide levels in the blood return to normal. Your oxygen level increases.  After 24 hours, the chance of having a heart attack starts to decrease. Your breath, hair, and body stop smelling like smoke.  After 48 hours, damaged nerve endings begin to recover. Your sense of taste and smell improve.  After 72 hours, the body is virtually free of nicotine. Your bronchial tubes relax and breathing becomes easier.  After 2 to 12 weeks, lungs can hold more air. Exercise becomes easier and circulation improves.  The risk of having a heart attack, stroke, cancer, or lung disease is greatly reduced.  After 1 year, the risk of coronary heart disease is cut in half.  After 5 years, the risk of stroke falls to the same as a nonsmoker.  After 10 years, the risk of lung cancer is cut in half and the risk of other cancers decreases significantly.  After 15 years, the risk of coronary heart disease drops, usually to the level of a nonsmoker.  If you are pregnant, quitting smoking will improve your chances of having a healthy baby.  The people you live with, especially  any children, will be healthier.  You will have extra money to spend on things other than cigarettes. QUESTIONS TO THINK ABOUT BEFORE ATTEMPTING TO QUIT You may want to talk about your answers with your health care provider.  Why do you want to quit?  If you tried to quit in the past, what helped and what did not?  What will be the most difficult situations for you after you quit? How will you plan to handle them?  Who can help you through the tough times? Your family? Friends? A health care provider?  What pleasures do you get from smoking? What ways can you still get pleasure if you quit? Here are some questions to ask your health care provider:  How can you help me to be successful at quitting?  What medicine do you think would be best for me and how should I take it?  What should I do if I need more help?  What is smoking withdrawal like? How can I get information on withdrawal? GET READY  Set a quit date.  Change your environment by getting rid of all cigarettes, ashtrays, matches, and lighters in your home, car, or work. Do not let people smoke in your home.  Review your past attempts to quit. Think about what worked and what did not. GET SUPPORT AND ENCOURAGEMENT You have a better chance of being successful if you have help. You can get support in many ways.  Tell your family, friends, and  coworkers that you are going to quit and need their support. Ask them not to smoke around you.  Get individual, group, or telephone counseling and support. Programs are available at [employer] and health centers. Call your local health department for information about programs in your area.  Spiritual beliefs and practices may help some smokers quit.  Download a "quit meter" on your computer to keep track of quit statistics, such as how long you have gone without smoking, cigarettes not smoked, and money saved.  Get a self-help book about quitting smoking and staying off  tobacco. Downing yourself from urges to smoke. Talk to someone, go for a walk, or occupy your time with a task.  Change your normal routine. Take a different route to work. Drink tea instead of coffee. Eat breakfast in a different place.  Reduce your stress. Take a hot bath, exercise, or read a book.  Plan something enjoyable to do every day. Reward yourself for not smoking.  Explore interactive web-based programs that specialize in helping you quit. GET MEDICINE AND USE IT CORRECTLY Medicines can help you stop smoking and decrease the urge to smoke. Combining medicine with the above behavioral methods and support can greatly increase your chances of successfully quitting smoking.  Nicotine replacement therapy helps deliver nicotine to your body without the negative effects and risks of smoking. Nicotine replacement therapy includes nicotine gum, lozenges, inhalers, nasal sprays, and skin patches. Some may be available over-the-counter and others require a prescription.  Antidepressant medicine helps people abstain from smoking, but how this works is unknown. This medicine is available by prescription.  Nicotinic receptor partial agonist medicine simulates the effect of nicotine in your brain. This medicine is available by prescription. Ask your health care provider for advice about which medicines to use and how to use them based on your health history. Your health care provider will tell you what side effects to look out for if you choose to be on a medicine or therapy. Carefully read the information on the package. Do not use any other product containing nicotine while using a nicotine replacement product.  RELAPSE OR DIFFICULT SITUATIONS Most relapses occur within the first 3 months after quitting. Do not be discouraged if you start smoking again. Remember, most people try several times before finally quitting. You may have symptoms of withdrawal because  your body is used to nicotine. You may crave cigarettes, be irritable, feel very hungry, cough often, get headaches, or have difficulty concentrating. The withdrawal symptoms are only temporary. They are strongest when you first quit, but they will go away within 10-14 days. To reduce the chances of relapse, try to:  Avoid drinking alcohol. Drinking lowers your chances of successfully quitting.  Reduce the amount of caffeine you consume. Once you quit smoking, the amount of caffeine in your body increases and can give you symptoms, such as a rapid heartbeat, sweating, and anxiety.  Avoid smokers because they can make you want to smoke.  Do not let weight gain distract you. Many smokers will gain weight when they quit, usually less than 10 pounds. Eat a healthy diet and stay active. You can always lose the weight gained after you quit.  Find ways to improve your mood other than smoking. FOR MORE INFORMATION  www.smokefree.gov  Document Released: 02/24/2001 Document Revised: 07/17/2013 Document Reviewed: 06/11/2011 [employer] Patient Information 2015 [place], [place]. This information is not intended to replace advice given to you by your health  care provider. Make sure you discuss any questions you have with your health care provider.

## 2013-10-17 NOTE — Telephone Encounter (Signed)
I s/w Satira Anis RN for Dr. Mare Ferrari and said that Dr. Mare Ferrari did go over echo and myoview results with pt. Rip Harbour states they did not go over lab results and asked for me to route lab over to her for Dr. Sherryl Barters review. I have made Brynda Rim. PA aware of this as well.

## 2013-10-17 NOTE — Assessment & Plan Note (Signed)
The patient has a history of frequent PVCs.

## 2013-10-17 NOTE — Assessment & Plan Note (Signed)
Patient was counseled on the importance of quitting smoking

## 2013-10-18 ENCOUNTER — Encounter: Payer: Self-pay | Admitting: Physician Assistant

## 2013-11-01 ENCOUNTER — Ambulatory Visit
Admission: RE | Admit: 2013-11-01 | Discharge: 2013-11-01 | Disposition: A | Discharge status: Home / Self Care | Payer: BC Managed Care – PPO | Source: Ambulatory Visit

## 2013-11-01 DIAGNOSIS — Z1231 Encounter for screening mammogram for malignant neoplasm of breast: Secondary | ICD-10-CM

## 2013-11-02 ENCOUNTER — Other Ambulatory Visit (HOSPITAL_COMMUNITY): Payer: Self-pay | Admitting: Family Medicine

## 2013-11-02 DIAGNOSIS — M069 Rheumatoid arthritis, unspecified: Secondary | ICD-10-CM

## 2013-11-02 DIAGNOSIS — Z0000 Encounter for general adult medical examination without abnormal findings: Secondary | ICD-10-CM

## 2013-11-02 DIAGNOSIS — E039 Hypothyroidism, unspecified: Secondary | ICD-10-CM

## 2013-12-04 ENCOUNTER — Other Ambulatory Visit (HOSPITAL_COMMUNITY): Payer: BC Managed Care – PPO

## 2014-01-17 ENCOUNTER — Ambulatory Visit (INDEPENDENT_AMBULATORY_CARE_PROVIDER_SITE_OTHER): Payer: BC Managed Care – PPO | Admitting: Cardiology

## 2014-01-17 ENCOUNTER — Encounter: Payer: Self-pay | Admitting: Cardiology

## 2014-01-17 VITALS — BP 154/90 | HR 88 | Ht 62.0 in | Wt 134.0 lb

## 2014-01-17 DIAGNOSIS — Z720 Tobacco use: Secondary | ICD-10-CM

## 2014-01-17 DIAGNOSIS — I10 Essential (primary) hypertension: Secondary | ICD-10-CM

## 2014-01-17 DIAGNOSIS — R0602 Shortness of breath: Secondary | ICD-10-CM

## 2014-01-17 DIAGNOSIS — I493 Ventricular premature depolarization: Secondary | ICD-10-CM

## 2014-01-17 MED ORDER — METOPROLOL SUCCINATE ER 25 MG PO TB24: 25.0000 mg | ORAL_TABLET | Freq: Every day | ORAL | Status: DC

## 2014-01-17 NOTE — Assessment & Plan Note (Signed)
The patient is trying to cut back on her smoking.  Her husband is a nonsmoker except for an occasional cigar

## 2014-01-17 NOTE — Progress Notes (Signed)
Emily Valentine Date of Birth:  [birth year] Emily Valentine [address] [place], [place]  [address] [phone]        Fax   [phone]   History of Present Illness: This pleasant 63 year old [place] American woman is seen for a followup office visit.  I had last seen her in June 2013.  She was initially sent to [place] because of an abnormal EKG showing right bundle branch block with left axis deviation.  He evaluated her at that time with a Lexa scan Myoview which was normal and we were able to  clear her for podiatry surgery.  She was lost to followup until recently when she was seen by Richardson Dopp PA C. on 10/04/13 for complaints of chest tightness and exertional dyspnea.  She underwent a echocardiogram as well as a treadmill Myoview, both studies on 10/16/13.  The Myoview showed no evidence of ischemia and her ejection fraction was 56%.  The echocardiogram showed an ejection fraction of 65-70% with vigorous contraction and no significant valve abnormalities.since last visit she has been on low dose Toprol 12.5 mg daily and has felt better.  Her PVCs have diminished.  Current Outpatient Prescriptions  Medication Sig Dispense Refill  . aspirin EC 81 MG tablet Take 1 tablet (81 mg total) by mouth daily.    . Cholecalciferol (VITAMIN D3 PO) Take by mouth. Taking 2000 daily    . Cyanocobalamin (VITAMIN B-12 IJ) Inject as directed every 30 (thirty) days.      . folic acid (FOLVITE) 1 MG tablet Take 1 mg by mouth daily.    Marland Kitchen ibuprofen (ADVIL,MOTRIN) 200 MG tablet Take 200 mg by mouth every 6 (six) hours as needed.    Marland Kitchen levothyroxine (SYNTHROID, LEVOTHROID) 100 MCG tablet Take 100 mcg by mouth daily.      Marland Kitchen losartan-hydrochlorothiazide (HYZAAR) 100-25 MG per tablet Take 1 tablet by mouth daily.     . magnesium hydroxide (MILK OF MAGNESIA) 400 MG/5ML suspension Take 30 mLs by mouth daily as needed.      . meloxicam (MOBIC) 15 MG tablet Take 15 mg by mouth daily.    . methotrexate  (50 MG/ML) 1 G injection Inject into the vein.    . metoprolol succinate (TOPROL XL) 25 MG 24 hr tablet Take 1 tablet (25 mg total) by mouth daily. 30 tablet 5  . Multiple Vitamin (MULTIVITAMIN) tablet Take 1 tablet by mouth daily.    . nitroGLYCERIN (NITROSTAT) 0.4 MG SL tablet Place 1 tablet (0.4 mg total) under the tongue every 5 (five) minutes as needed for chest pain. 25 tablet 3  . traMADol (ULTRAM) 50 MG tablet Take by mouth every 6 (six) hours as needed.    Marland Kitchen VIVELLE-DOT 0.1 MG/24HR patch APPLY AS DIRECTED. 8 patch 12   No current facility-administered medications for this visit.    Allergies  Allergen Reactions  . Oxycontin [Oxycodone] Nausea And Vomiting    Patient Active Problem List   Diagnosis Date Noted  . 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 Palpitations 10/04/2013  . Right bundle branch block 08/28/2011  . Tobacco abuse 08/28/2011  . Essential hypertension 08/28/2011  . Encounter for screening colonoscopy 10/14/2010    History  Smoking status  . Current Every Day Smoker -- 1.00 packs/day  . Types: Cigarettes  Smokeless tobacco  . Not on file    History  Alcohol Use No    Family History  Problem Relation Age of Onset  . Colon cancer  Father     diagnosed age 61, died at age 61  . Stomach cancer Mother     deceased  . Anemia Father   . Anemia Sister   . Anemia Mother   . Cancer Mother   . Cancer Father   . Cancer Brother   . Diabetes Sister   . Hypertension Father   . Hypertension Mother   . Hypertension Sister   . Hypertension Brother   . Thyroid disease Mother   . Thyroid disease Brother   . Thyroid disease Sister     Review of Systems: Constitutional: no fever chills diaphoresis or fatigue or change in weight.  Head and neck: no hearing loss, no epistaxis, no photophobia or visual disturbance. Respiratory: No cough, shortness of breath or wheezing. Cardiovascular: No chest pain peripheral edema, palpitations. Gastrointestinal: No abdominal  distention, no abdominal pain, no change in bowel habits hematochezia or melena. Genitourinary: No dysuria, no frequency, no urgency, no nocturia. Musculoskeletal:No arthralgias, no back pain, no gait disturbance or myalgias. Neurological: No dizziness, no headaches, no numbness, no seizures, no syncope, no weakness, no tremors. Hematologic: No lymphadenopathy, no easy bruising. Psychiatric: No confusion, no hallucinations, no sleep disturbance.    Physical Exam: Filed Vitals:   01/17/14 0949  BP: 154/90  Pulse: 88   general appearance reveals a well-developed well-nourished woman in no distress.The head and neck exam reveals pupils equal and reactive.  Extraocular movements are full.  There is no scleral icterus.  The mouth and pharynx are normal.  The neck is supple.  The carotids reveal no bruits.  The jugular venous pressure is normal.  The  thyroid is not enlarged.  There is no lymphadenopathy.  The chest is clear to percussion and auscultation.  There are no rales or rhonchi.  Expansion of the chest is symmetrical.  The precordium is quiet.  Frequent premature beats are noted on auscultation.  The first heart sound is normal.  The second heart sound is physiologically split.  There is no murmur gallop rub or click.  There is no abnormal lift or heave.  The abdomen is soft and nontender.  The bowel sounds are normal.  The liver and spleen are not enlarged.  There are no abdominal masses.  There are no abdominal bruits.  Extremities reveal good pedal pulses.  There is no phlebitis or edema.  There is no cyanosis or clubbing.  Strength is normal and symmetrical in all extremities.  There is no lateralizing weakness.  There are no sensory deficits.  The skin is warm and dry.  There is no rash.     Assessment / Plan: 1. essential hypertension 2. noncardiac chest pain.  Recent normal treadmill Myoview on 10/16/13 shows no ischemia 3. frequent PVCs which may be contributing to her sense of chest  tightness 4. tobacco abuse  Disposition: increase Toprol up to a full 25 mg tablet daily. Recheck in 6 months for office visit and EKG.  Try to stop smoking.

## 2014-01-17 NOTE — Assessment & Plan Note (Signed)
No chest pain or shortness of breath.  No dizziness or syncope.

## 2014-01-17 NOTE — Patient Instructions (Signed)
INCREASE YOUR TOPROL (METOPROLOL) XL 25 MG TO FULL TABLET DAILY   Your physician wants you to follow-up in: 6 ,month ov/ekg  You will receive a reminder letter in the mail two months in advance. If you don't receive a letter, please call our office to schedule the follow-up appointment.

## 2014-01-17 NOTE — Addendum Note (Signed)
Addended by: Alvina Filbert B on: 01/17/2014 03:01 PM   Modules accepted: Orders, Medications

## 2014-01-17 NOTE — Assessment & Plan Note (Signed)
PVCs have improved on beta blocker

## 2014-07-26 ENCOUNTER — Ambulatory Visit: Payer: BC Managed Care – PPO | Admitting: Cardiology

## 2014-07-31 ENCOUNTER — Other Ambulatory Visit: Payer: Self-pay | Admitting: Cardiology

## 2014-08-14 ENCOUNTER — Ambulatory Visit (INDEPENDENT_AMBULATORY_CARE_PROVIDER_SITE_OTHER): Payer: BC Managed Care – PPO | Admitting: Cardiology

## 2014-08-14 ENCOUNTER — Encounter: Payer: Self-pay | Admitting: Cardiology

## 2014-08-14 VITALS — BP 148/70 | HR 71 | Ht 62.0 in | Wt 138.4 lb

## 2014-08-14 DIAGNOSIS — I493 Ventricular premature depolarization: Secondary | ICD-10-CM | POA: Diagnosis not present

## 2014-08-14 DIAGNOSIS — R0602 Shortness of breath: Secondary | ICD-10-CM

## 2014-08-14 DIAGNOSIS — I10 Essential (primary) hypertension: Secondary | ICD-10-CM | POA: Diagnosis not present

## 2014-08-14 DIAGNOSIS — Z720 Tobacco use: Secondary | ICD-10-CM

## 2014-08-14 MED ORDER — METOPROLOL SUCCINATE ER 25 MG PO TB24: ORAL_TABLET | ORAL | Status: DC

## 2014-08-14 NOTE — Patient Instructions (Signed)
Medication Instructions:  Your physician recommends that you continue on your current medications as directed. Please refer to the Current Medication list given to you today.  Labwork: none  Testing/Procedures: none  Follow-Up: Your physician wants you to follow-up in: 6 month ov You will receive a reminder letter in the mail two months in advance. If you don't receive a letter, please call our office to schedule the follow-up appointment.   Any Other Special Instructions Will Be Listed Below (If Applicable). Work harder on diet and weight loss  Avoid salt

## 2014-08-14 NOTE — Progress Notes (Signed)
Cardiology Office Note   Date:  08/14/2014   ID:  Emily Valentine, DOB 03-27-1950, MRN [id number]  PCP:  Emily Kilts, MD  Cardiologist: Emily Coco MD  No chief complaint on file.     History of Present Illness: Emily Valentine is a 64 y.o. female who presents for scheduled follow-up office visit  This pleasant 64 year old African American woman is seen for a followup office visit.  She was initially sent to [place] because of an abnormal EKG showing right bundle branch block with left axis deviation.We evaluated her at that time with a Lexa scan Myoview which was normal and we were able to clear her for podiatry surgery.  She was seen on 10/04/13 for complaints of chest tightness and exertional dyspnea. She underwent a echocardiogram as well as a treadmill Myoview, both studies on 10/16/13. The Myoview showed no evidence of ischemia and her ejection fraction was 56%. The echocardiogram showed an ejection fraction of 65-70% with vigorous contraction and no significant valve abnormalities.since last visit she has been on low dose Toprol 25 mg daily and has felt better. Her PVCs have diminished. She denies any chest discomfort or shortness of breath.  She is not having any trouble sleeping at night.  Past Medical History  Diagnosis Date  . Hypertension   . Pernicious anemia   . S/P colonoscopy 2001, 2004    2001: hyperplastic polyp, 2004: normal, inflammatory polyp  . Hypothyroid   . History of echocardiogram     Echo (8/15): Vigorous LV function, EF 65-70%, normal wall motion, grade 1 diastolic dysfunction, mild to moderate TR, PASP 31 mm Hg  . Hx of cardiovascular stress test     ETT-Myoview (8/15): no ischemia, EF 56%, normal study    Past Surgical History  Procedure Laterality Date  . Thyroidectomy    . Abdominal hysterectomy    . Back surgery    . Colonoscopy  11/10/2010    Procedure: COLONOSCOPY;  Surgeon: Emily Dolin, MD;  Location: [facility];   Service: Endoscopy;  Laterality: N/A;     Current Outpatient Prescriptions  Medication Sig Dispense Refill  . aspirin EC 81 MG tablet Take 1 tablet (81 mg total) by mouth daily.    . Cholecalciferol (VITAMIN D3 PO) Take 2,000 mg by mouth daily. Taking 2000 daily    . Cyanocobalamin (VITAMIN B-12 IJ) Inject as directed every 30 (thirty) days.      Marland Kitchen estradiol (VIVELLE-DOT) 0.1 MG/24HR patch Place 1 patch onto the skin 2 (two) times a week.    . folic acid (FOLVITE) 1 MG tablet Take 1 mg by mouth daily.    Marland Kitchen ibuprofen (ADVIL,MOTRIN) 200 MG tablet Take 200 mg by mouth every 6 (six) hours as needed (pain).     Marland Kitchen levothyroxine (SYNTHROID, LEVOTHROID) 100 MCG tablet Take 100 mcg by mouth daily.      Marland Kitchen losartan-hydrochlorothiazide (HYZAAR) 100-25 MG per tablet Take 1 tablet by mouth daily.     . magnesium hydroxide (MILK OF MAGNESIA) 400 MG/5ML suspension Take 30 mLs by mouth daily as needed (constipation).     . meloxicam (MOBIC) 15 MG tablet Take 15 mg by mouth daily.    . Methotrexate, Anti-Rheumatic, (METHOTREXATE, PF, [place]) Inject 10 mg into the skin once a week.     . metoprolol succinate (TOPROL-XL) 25 MG 24 hr tablet TAKE (1) TABLET BY MOUTH DAILY. 30 tablet 1  . Multiple Vitamin (MULTIVITAMIN) tablet Take 1 tablet by mouth  daily.    . nitroGLYCERIN (NITROSTAT) 0.4 MG SL tablet Place 1 tablet (0.4 mg total) under the tongue every 5 (five) minutes as needed for chest pain. 25 tablet 3  . traMADol (ULTRAM) 50 MG tablet Take 50 mg by mouth every 6 (six) hours as needed (for pain).      No current facility-administered medications for this visit.    Allergies:   Oxycontin    Social History:  The patient  reports that she has been smoking Cigarettes.  She has been smoking about 1.00 pack per day. She does not have any smokeless tobacco history on file. She reports that she does not drink alcohol or use illicit drugs.   Family History:  The patient's family history includes Anemia in her  father, mother, and sister; Cancer in her brother, father, and mother; Colon cancer in her father; Diabetes in her sister; Hypertension in her brother, father, mother, and sister; Stomach cancer in her mother; Thyroid disease in her brother, mother, and sister.    ROS:  Please see the history of present illness.   Otherwise, review of systems are positive for none.   All other systems are reviewed and negative.    PHYSICAL EXAM: VS:  BP 148/70 mmHg  Pulse 71  Ht 5\' 2"  (1.575 m)  Wt 138 lb 6.4 oz (62.778 kg)  BMI 25.31 kg/m2 , BMI Body mass index is 25.31 kg/(m^2). GEN: Well nourished, well developed, in no acute distress HEENT: normal Neck: no JVD, carotid bruits, or masses Cardiac: RRR; no murmurs, rubs, or gallops,no edema  Respiratory:  clear to auscultation bilaterally, normal work of breathing GI: soft, nontender, nondistended, + BS MS: no deformity or atrophy Skin: warm and dry, no rash Neuro:  Strength and sensation are intact Psych: euthymic mood, full affect   EKG:  EKG is ordered today. The ekg ordered today demonstrates normal sinus rhythm.  Occasional PVC.  Right bundle branch block.  Since prior tracing of 10/04/13, no significant change   Recent Labs: No results found for requested labs within last 365 days.    Lipid Panel    Component Value Date/Time   CHOL 151 10/16/2013 1023   TRIG 116.0 10/16/2013 1023   HDL 37.50* 10/16/2013 1023   CHOLHDL 4 10/16/2013 1023   VLDL 23.2 10/16/2013 1023   LDLCALC 90 10/16/2013 1023      Wt Readings from Last 3 Encounters:  08/14/14 138 lb 6.4 oz (62.778 kg)  01/17/14 134 lb (60.782 kg)  10/17/13 130 lb (58.968 kg)      Other studies Reviewed:    ASSESSMENT AND PLAN:  1. essential hypertension 2. noncardiac chest pain. Recent normal treadmill Myoview on 10/16/13 shows no ischemia 3.  Occasional PVCs, improved on beta blocker. 4. tobacco abuse 5.  Right bundle branch block  Disposition: Continue current dose  of Toprol 25 mg daily.. Recheck in 6 months for office visit.  Lose weight.  Stop smoking.  Avoided salt in diet to improve blood pressure further..   Current medicines are reviewed at length with the patient today.  The patient does not have concerns regarding medicines.  The following changes have been made:  no change  Labs/ tests ordered today include:  No orders of the defined types were placed in this encounter.      Berna Spare MD 08/14/2014 8:40 AM    [facility] [address], [place], [place]  [address] Phone: [phone]; Fax: [phone]

## 2014-08-28 ENCOUNTER — Telehealth: Payer: Self-pay | Admitting: *Deleted

## 2014-08-28 NOTE — Telephone Encounter (Signed)
Left message x 1. JSY 

## 2014-08-30 ENCOUNTER — Other Ambulatory Visit: Payer: Self-pay | Admitting: Obstetrics and Gynecology

## 2014-08-31 NOTE — Telephone Encounter (Signed)
Spoke with pt. Pt is requesting a refill on Vivelle patch. Thanks!! [place]

## 2014-08-31 NOTE — Telephone Encounter (Signed)
Pt aware can not refill patches til seen, will make appt

## 2014-09-03 ENCOUNTER — Ambulatory Visit: Payer: Self-pay | Admitting: Obstetrics and Gynecology

## 2014-09-05 ENCOUNTER — Encounter: Payer: Self-pay | Admitting: Obstetrics and Gynecology

## 2014-09-05 ENCOUNTER — Ambulatory Visit (INDEPENDENT_AMBULATORY_CARE_PROVIDER_SITE_OTHER): Payer: BC Managed Care – PPO | Admitting: Obstetrics and Gynecology

## 2014-09-05 VITALS — BP 140/88 | Ht 62.0 in | Wt 139.0 lb

## 2014-09-05 DIAGNOSIS — N951 Menopausal and female climacteric states: Secondary | ICD-10-CM | POA: Diagnosis not present

## 2014-09-05 MED ORDER — ESTRADIOL 0.1 MG/24HR TD PTTW: MEDICATED_PATCH | TRANSDERMAL | Status: DC

## 2014-09-05 NOTE — Progress Notes (Signed)
Patient ID: Emily Valentine, female   DOB: [birth year], 64 y.o.   MRN: [id number] Pt here today for a refill on her vivelle dot. Pt denies any problems or concerns at this time.

## 2014-09-05 NOTE — Progress Notes (Signed)
[facility] Visit  Patient name: Emily Valentine MRN [id number]  Date of birth: Feb 17, 1951  CC & HPI:  Emily Valentine Susan is a 64 y.o. female presenting today for a refill of Vivelle Dot. She states it has been effective at alleviating her hot sweats.  Patient states she retired a couple of years ago, and she has gained 7 pounds since then. She denies any changes in height.  Patient last had a FOBT done last year.  Patient reports a diagnosis of RA.   ROS:  A complete review of systems was obtained and all systems are negative except as noted in the HPI and PMH.    Pertinent History Reviewed:   Reviewed: Significant for abdominal hysterectomy Medical         Past Medical History  Diagnosis Date  . Hypertension   . Pernicious anemia   . S/P colonoscopy 2001, 2004    2001: hyperplastic polyp, 2004: normal, inflammatory polyp  . Hypothyroid   . History of echocardiogram     Echo (8/15): Vigorous LV function, EF 65-70%, normal wall motion, grade 1 diastolic dysfunction, mild to moderate TR, PASP 31 mm Hg  . Hx of cardiovascular stress test     ETT-Myoview (8/15): no ischemia, EF 56%, normal study                              Surgical Hx:    Past Surgical History  Procedure Laterality Date  . Thyroidectomy    . Abdominal hysterectomy    . Back surgery    . Colonoscopy  11/10/2010    Procedure: COLONOSCOPY;  Surgeon: Daneil Dolin, MD;  Location: [facility];  Service: Endoscopy;  Laterality: N/A;   Medications: Reviewed & Updated - see associated section                       Current outpatient prescriptions:  .  aspirin EC 81 MG tablet, Take 1 tablet (81 mg total) by mouth daily., Disp: , Rfl:  .  Cholecalciferol (VITAMIN D3 PO), Take 2,000 mg by mouth daily. Taking 2000 daily, Disp: , Rfl:  .  Cyanocobalamin (VITAMIN B-12 IJ), Inject as directed every 30 (thirty) days.  , Disp: , Rfl:  .  folic acid (FOLVITE) 1 MG tablet, Take 1 mg by mouth daily., Disp: , Rfl:  .   levothyroxine (SYNTHROID, LEVOTHROID) 100 MCG tablet, Take 100 mcg by mouth daily.  , Disp: , Rfl:  .  losartan-hydrochlorothiazide (HYZAAR) 100-25 MG per tablet, Take 1 tablet by mouth daily. , Disp: , Rfl:  .  magnesium hydroxide (MILK OF MAGNESIA) 400 MG/5ML suspension, Take 30 mLs by mouth daily as needed (constipation). , Disp: , Rfl:  .  meloxicam (MOBIC) 15 MG tablet, Take 15 mg by mouth daily., Disp: , Rfl:  .  Methotrexate, Anti-Rheumatic, (METHOTREXATE, PF, [place]), Inject 10 mg into the skin once a week. , Disp: , Rfl:  .  metoprolol succinate (TOPROL-XL) 25 MG 24 hr tablet, TAKE (1) TABLET BY MOUTH DAILY., Disp: 90 tablet, Rfl: 3 .  Multiple Vitamin (MULTIVITAMIN) tablet, Take 1 tablet by mouth daily., Disp: , Rfl:  .  VIVELLE-DOT 0.1 MG/24HR patch, APPLY AS DIRECTED., Disp: 8 patch, Rfl: PRN .  nitroGLYCERIN (NITROSTAT) 0.4 MG SL tablet, Place 1 tablet (0.4 mg total) under the tongue every 5 (five) minutes as needed for chest pain. (Patient not  taking: Reported on 09/05/2014), Disp: 25 tablet, Rfl: 3   Social History: Reviewed -  reports that she has been smoking Cigarettes.  She has a 20 pack-year smoking history. She has never used smokeless tobacco.  Objective Findings:  Vitals: Blood pressure 140/88, height 5\' 2"  (1.575 m), weight 139 lb (63.05 kg).  Physical Examination:  Physical Exam  General appearance - alert, well appearing, and in no distress, oriented to person, place, and time and normal appearing weight Mental status - alert, oriented to person, place, and time, normal mood, behavior, speech, dress, motor activity, and thought processes, affect appropriate to mood Breasts - breasts appear normal, no suspicious masses, no skin or nipple changes or axillary nodes, breast appear even and symmetricg Patient here for discussion only.   Assessment & Plan:   A:  1. Postmenopausal hot flashes.   P:  1. Continue/refill Vivelle Dot 0.1 mg patch qd 2. FOBT cards given to pt.   3. Advised yearly mammogram.    This chart was SCRIBED for Emily Shirk, MD by Stephania Fragmin, ED Scribe. This patient was seen in room 1 and the patient's care was started at 2:09 PM.  I personally performed the services described in this documentation, which was SCRIBED in my presence. The recorded information has been reviewed and considered accurate. It has been edited as necessary during review. Jonnie Kind, MD

## 2014-09-06 NOTE — Addendum Note (Signed)
Addended by: Marion Downer A on: 09/06/2014 11:09 AM   Modules accepted: Orders

## 2014-09-24 ENCOUNTER — Other Ambulatory Visit: Payer: Self-pay

## 2014-09-24 ENCOUNTER — Other Ambulatory Visit (INDEPENDENT_AMBULATORY_CARE_PROVIDER_SITE_OTHER): Payer: BC Managed Care – PPO

## 2014-09-24 DIAGNOSIS — Z139 Encounter for screening, unspecified: Secondary | ICD-10-CM

## 2014-09-24 DIAGNOSIS — Z1231 Encounter for screening mammogram for malignant neoplasm of breast: Secondary | ICD-10-CM

## 2014-09-24 DIAGNOSIS — Z1212 Encounter for screening for malignant neoplasm of rectum: Secondary | ICD-10-CM | POA: Diagnosis not present

## 2014-09-24 LAB — HEMOCCULT GUIAC POC 1CARD (OFFICE)
Card #2 Fecal Occult Blod, POC: NEGATIVE
Card #3 Fecal Occult Blood, POC: NEGATIVE
Fecal Occult Blood, POC: NEGATIVE

## 2014-09-27 DIAGNOSIS — N6019 Diffuse cystic mastopathy of unspecified breast: Secondary | ICD-10-CM

## 2014-09-27 HISTORY — DX: Diffuse cystic mastopathy of unspecified breast: N60.19

## 2014-11-05 ENCOUNTER — Ambulatory Visit
Admission: RE | Admit: 2014-11-05 | Discharge: 2014-11-05 | Disposition: A | Discharge status: Home / Self Care | Payer: BC Managed Care – PPO | Source: Ambulatory Visit

## 2014-11-05 DIAGNOSIS — Z1231 Encounter for screening mammogram for malignant neoplasm of breast: Secondary | ICD-10-CM

## 2014-11-06 ENCOUNTER — Other Ambulatory Visit: Payer: Self-pay | Admitting: Family Medicine

## 2014-11-06 DIAGNOSIS — R928 Other abnormal and inconclusive findings on diagnostic imaging of breast: Secondary | ICD-10-CM

## 2014-11-12 ENCOUNTER — Other Ambulatory Visit: Payer: BC Managed Care – PPO

## 2014-11-16 ENCOUNTER — Ambulatory Visit
Admission: RE | Admit: 2014-11-16 | Discharge: 2014-11-16 | Disposition: A | Discharge status: Home / Self Care | Payer: BC Managed Care – PPO | Source: Ambulatory Visit | Attending: Family Medicine | Admitting: Family Medicine

## 2014-11-16 ENCOUNTER — Other Ambulatory Visit: Payer: Self-pay | Admitting: Family Medicine

## 2014-11-16 DIAGNOSIS — R928 Other abnormal and inconclusive findings on diagnostic imaging of breast: Secondary | ICD-10-CM

## 2014-11-26 ENCOUNTER — Other Ambulatory Visit: Payer: Self-pay | Admitting: Family Medicine

## 2014-11-26 DIAGNOSIS — R928 Other abnormal and inconclusive findings on diagnostic imaging of breast: Secondary | ICD-10-CM

## 2014-11-27 ENCOUNTER — Ambulatory Visit
Admission: RE | Admit: 2014-11-27 | Discharge: 2014-11-27 | Disposition: A | Discharge status: Home / Self Care | Payer: BC Managed Care – PPO | Source: Ambulatory Visit | Attending: Family Medicine | Admitting: Family Medicine

## 2014-11-27 DIAGNOSIS — R928 Other abnormal and inconclusive findings on diagnostic imaging of breast: Secondary | ICD-10-CM

## 2014-11-27 HISTORY — PX: BREAST BIOPSY: SHX20

## 2015-01-17 ENCOUNTER — Other Ambulatory Visit: Payer: Self-pay | Admitting: General Surgery

## 2015-01-17 DIAGNOSIS — N6310 Unspecified lump in the right breast, unspecified quadrant: Secondary | ICD-10-CM | POA: Insufficient documentation

## 2015-01-28 ENCOUNTER — Other Ambulatory Visit: Payer: Self-pay | Admitting: General Surgery

## 2015-01-28 DIAGNOSIS — N6310 Unspecified lump in the right breast, unspecified quadrant: Secondary | ICD-10-CM

## 2015-02-13 ENCOUNTER — Ambulatory Visit: Payer: BC Managed Care – PPO | Admitting: Cardiology

## 2015-02-14 ENCOUNTER — Encounter (HOSPITAL_BASED_OUTPATIENT_CLINIC_OR_DEPARTMENT_OTHER): Payer: Self-pay | Admitting: *Deleted

## 2015-02-15 ENCOUNTER — Encounter (HOSPITAL_BASED_OUTPATIENT_CLINIC_OR_DEPARTMENT_OTHER)
Admission: RE | Admit: 2015-02-15 | Discharge: 2015-02-15 | Disposition: A | Discharge status: Home / Self Care | Payer: BC Managed Care – PPO | Source: Ambulatory Visit | Attending: General Surgery | Admitting: General Surgery

## 2015-02-15 DIAGNOSIS — N6091 Unspecified benign mammary dysplasia of right breast: Secondary | ICD-10-CM | POA: Diagnosis not present

## 2015-02-15 DIAGNOSIS — D241 Benign neoplasm of right breast: Secondary | ICD-10-CM | POA: Diagnosis not present

## 2015-02-15 DIAGNOSIS — Z90710 Acquired absence of both cervix and uterus: Secondary | ICD-10-CM | POA: Diagnosis not present

## 2015-02-15 DIAGNOSIS — F17210 Nicotine dependence, cigarettes, uncomplicated: Secondary | ICD-10-CM | POA: Diagnosis not present

## 2015-02-15 DIAGNOSIS — N641 Fat necrosis of breast: Secondary | ICD-10-CM | POA: Diagnosis not present

## 2015-02-15 DIAGNOSIS — Z885 Allergy status to narcotic agent status: Secondary | ICD-10-CM | POA: Diagnosis not present

## 2015-02-15 DIAGNOSIS — Z85850 Personal history of malignant neoplasm of thyroid: Secondary | ICD-10-CM | POA: Diagnosis not present

## 2015-02-15 DIAGNOSIS — N6011 Diffuse cystic mastopathy of right breast: Secondary | ICD-10-CM | POA: Diagnosis not present

## 2015-02-15 DIAGNOSIS — I10 Essential (primary) hypertension: Secondary | ICD-10-CM | POA: Diagnosis not present

## 2015-02-15 DIAGNOSIS — I4510 Unspecified right bundle-branch block: Secondary | ICD-10-CM | POA: Diagnosis not present

## 2015-02-15 DIAGNOSIS — N63 Unspecified lump in breast: Secondary | ICD-10-CM | POA: Diagnosis present

## 2015-02-15 DIAGNOSIS — Z7982 Long term (current) use of aspirin: Secondary | ICD-10-CM | POA: Diagnosis not present

## 2015-02-15 DIAGNOSIS — J45909 Unspecified asthma, uncomplicated: Secondary | ICD-10-CM | POA: Diagnosis not present

## 2015-02-15 DIAGNOSIS — E039 Hypothyroidism, unspecified: Secondary | ICD-10-CM | POA: Diagnosis not present

## 2015-02-15 LAB — BASIC METABOLIC PANEL
Anion gap: 7 (ref 5–15)
BUN: 20 mg/dL (ref 6–20)
CALCIUM: 9.8 mg/dL (ref 8.9–10.3)
CO2: 29 mmol/L (ref 22–32)
Chloride: 101 mmol/L (ref 101–111)
Creatinine, Ser: 1.49 mg/dL — ABNORMAL HIGH (ref 0.44–1.00)
GFR calc Af Amer: 41 mL/min — ABNORMAL LOW (ref >60)
GFR, EST NON AFRICAN AMERICAN: 36 mL/min — AB (ref >60)
GLUCOSE: 108 mg/dL — AB (ref 65–99)
Potassium: 3.4 mmol/L — ABNORMAL LOW (ref 3.5–5.1)
Sodium: 137 mmol/L (ref 135–145)

## 2015-02-15 LAB — CBC WITH DIFFERENTIAL/PLATELET
BASOS ABS: 0 10*3/uL (ref 0.0–0.1)
BASOS PCT: 1 %
EOS PCT: 3 %
Eosinophils Absolute: 0.2 10*3/uL (ref 0.0–0.7)
HCT: 36.6 % (ref 36.0–46.0)
Hemoglobin: 13 g/dL (ref 12.0–15.0)
LYMPHS PCT: 29 %
Lymphs Abs: 1.7 10*3/uL (ref 0.7–4.0)
MCH: 34.2 pg — ABNORMAL HIGH (ref 26.0–34.0)
MCHC: 35.5 g/dL (ref 30.0–36.0)
MCV: 96.3 fL (ref 78.0–100.0)
MONO ABS: 0.2 10*3/uL (ref 0.1–1.0)
Monocytes Relative: 4 %
Neutro Abs: 3.8 10*3/uL (ref 1.7–7.7)
Neutrophils Relative %: 63 %
PLATELETS: 320 10*3/uL (ref 150–400)
RBC: 3.8 MIL/uL — ABNORMAL LOW (ref 3.87–5.11)
RDW: 15.4 % (ref 11.5–15.5)
WBC: 5.9 10*3/uL (ref 4.0–10.5)

## 2015-02-18 ENCOUNTER — Ambulatory Visit
Admission: RE | Admit: 2015-02-18 | Discharge: 2015-02-18 | Disposition: A | Discharge status: Home / Self Care | Payer: Medicare Other | Source: Ambulatory Visit | Attending: General Surgery | Admitting: General Surgery

## 2015-02-18 DIAGNOSIS — N6310 Unspecified lump in the right breast, unspecified quadrant: Secondary | ICD-10-CM

## 2015-02-18 NOTE — Progress Notes (Signed)
Dr Jenita Seashore reviewed lab results.  Would like for pt to come in early for bmet dos.  Called pt she will come in one hour earlier  At 16  Dr Donne Hazel notified of results states this is base line for pt

## 2015-02-21 ENCOUNTER — Encounter (HOSPITAL_BASED_OUTPATIENT_CLINIC_OR_DEPARTMENT_OTHER): Payer: Self-pay | Admitting: Anesthesiology

## 2015-02-21 ENCOUNTER — Ambulatory Visit
Admission: RE | Admit: 2015-02-21 | Discharge: 2015-02-21 | Disposition: A | Discharge status: Home / Self Care | Payer: Medicare Other | Source: Ambulatory Visit | Attending: General Surgery | Admitting: General Surgery

## 2015-02-21 ENCOUNTER — Ambulatory Visit (HOSPITAL_BASED_OUTPATIENT_CLINIC_OR_DEPARTMENT_OTHER): Payer: Medicare Other | Admitting: Anesthesiology

## 2015-02-21 ENCOUNTER — Encounter (HOSPITAL_BASED_OUTPATIENT_CLINIC_OR_DEPARTMENT_OTHER)
Admission: RE | Disposition: A | Discharge status: Home / Self Care | Payer: Self-pay | Source: Ambulatory Visit | Attending: General Surgery

## 2015-02-21 ENCOUNTER — Ambulatory Visit (HOSPITAL_BASED_OUTPATIENT_CLINIC_OR_DEPARTMENT_OTHER)
Admission: RE | Admit: 2015-02-21 | Discharge: 2015-02-21 | Disposition: A | Discharge status: Home / Self Care | Payer: Medicare Other | Source: Ambulatory Visit | Attending: General Surgery | Admitting: General Surgery

## 2015-02-21 DIAGNOSIS — Z885 Allergy status to narcotic agent status: Secondary | ICD-10-CM | POA: Insufficient documentation

## 2015-02-21 DIAGNOSIS — N6310 Unspecified lump in the right breast, unspecified quadrant: Secondary | ICD-10-CM

## 2015-02-21 DIAGNOSIS — I10 Essential (primary) hypertension: Secondary | ICD-10-CM | POA: Insufficient documentation

## 2015-02-21 DIAGNOSIS — N6091 Unspecified benign mammary dysplasia of right breast: Secondary | ICD-10-CM | POA: Diagnosis not present

## 2015-02-21 DIAGNOSIS — N641 Fat necrosis of breast: Secondary | ICD-10-CM | POA: Diagnosis not present

## 2015-02-21 DIAGNOSIS — D241 Benign neoplasm of right breast: Secondary | ICD-10-CM | POA: Insufficient documentation

## 2015-02-21 DIAGNOSIS — N6011 Diffuse cystic mastopathy of right breast: Secondary | ICD-10-CM | POA: Diagnosis not present

## 2015-02-21 DIAGNOSIS — I4510 Unspecified right bundle-branch block: Secondary | ICD-10-CM | POA: Insufficient documentation

## 2015-02-21 DIAGNOSIS — Z7982 Long term (current) use of aspirin: Secondary | ICD-10-CM | POA: Insufficient documentation

## 2015-02-21 DIAGNOSIS — F17210 Nicotine dependence, cigarettes, uncomplicated: Secondary | ICD-10-CM | POA: Insufficient documentation

## 2015-02-21 DIAGNOSIS — J45909 Unspecified asthma, uncomplicated: Secondary | ICD-10-CM | POA: Insufficient documentation

## 2015-02-21 DIAGNOSIS — E039 Hypothyroidism, unspecified: Secondary | ICD-10-CM | POA: Insufficient documentation

## 2015-02-21 DIAGNOSIS — Z90710 Acquired absence of both cervix and uterus: Secondary | ICD-10-CM | POA: Insufficient documentation

## 2015-02-21 DIAGNOSIS — Z85850 Personal history of malignant neoplasm of thyroid: Secondary | ICD-10-CM | POA: Insufficient documentation

## 2015-02-21 HISTORY — PX: BREAST EXCISIONAL BIOPSY: SUR124

## 2015-02-21 HISTORY — PX: BREAST LUMPECTOMY WITH RADIOACTIVE SEED LOCALIZATION: SHX6424

## 2015-02-21 LAB — BASIC METABOLIC PANEL
ANION GAP: 6 (ref 5–15)
BUN: 25 mg/dL — AB (ref 6–20)
CHLORIDE: 104 mmol/L (ref 101–111)
CO2: 29 mmol/L (ref 22–32)
Calcium: 8.9 mg/dL (ref 8.9–10.3)
Creatinine, Ser: 1.38 mg/dL — ABNORMAL HIGH (ref 0.44–1.00)
GFR, EST AFRICAN AMERICAN: 45 mL/min — AB (ref >60)
GFR, EST NON AFRICAN AMERICAN: 39 mL/min — AB (ref >60)
Glucose, Bld: 79 mg/dL (ref 65–99)
POTASSIUM: 3.1 mmol/L — AB (ref 3.5–5.1)
SODIUM: 139 mmol/L (ref 135–145)

## 2015-02-21 SURGERY — BREAST LUMPECTOMY WITH RADIOACTIVE SEED LOCALIZATION
Anesthesia: General | Site: Breast | Laterality: Right

## 2015-02-21 MED ORDER — ONDANSETRON HCL 4 MG/2ML IJ SOLN: 4.0000 mg | Freq: Once | INTRAMUSCULAR | Status: DC | PRN

## 2015-02-21 MED ORDER — LIDOCAINE HCL (CARDIAC) 20 MG/ML IV SOLN
INTRAVENOUS | Status: AC
  Filled 2015-02-21: qty 5

## 2015-02-21 MED ORDER — MIDAZOLAM HCL 5 MG/5ML IJ SOLN
INTRAMUSCULAR | Status: DC | PRN
  Administered 2015-02-21: 2 mg via INTRAVENOUS

## 2015-02-21 MED ORDER — LIDOCAINE HCL (CARDIAC) 20 MG/ML IV SOLN
INTRAVENOUS | Status: DC | PRN
  Administered 2015-02-21: 50 mg via INTRAVENOUS

## 2015-02-21 MED ORDER — DEXAMETHASONE SODIUM PHOSPHATE 10 MG/ML IJ SOLN
INTRAMUSCULAR | Status: AC
  Filled 2015-02-21: qty 1

## 2015-02-21 MED ORDER — GLYCOPYRROLATE 0.2 MG/ML IJ SOLN: 0.2000 mg | Freq: Once | INTRAMUSCULAR | Status: DC | PRN

## 2015-02-21 MED ORDER — FENTANYL CITRATE (PF) 100 MCG/2ML IJ SOLN
INTRAMUSCULAR | Status: AC
  Filled 2015-02-21: qty 2

## 2015-02-21 MED ORDER — BUPIVACAINE HCL (PF) 0.25 % IJ SOLN
INTRAMUSCULAR | Status: AC
  Filled 2015-02-21: qty 30

## 2015-02-21 MED ORDER — FENTANYL CITRATE (PF) 100 MCG/2ML IJ SOLN: 25.0000 ug | INTRAMUSCULAR | Status: DC | PRN

## 2015-02-21 MED ORDER — PROPOFOL 10 MG/ML IV BOLUS
INTRAVENOUS | Status: DC | PRN
  Administered 2015-02-21: 150 mg via INTRAVENOUS

## 2015-02-21 MED ORDER — FENTANYL CITRATE (PF) 100 MCG/2ML IJ SOLN: 50.0000 ug | INTRAMUSCULAR | Status: DC | PRN

## 2015-02-21 MED ORDER — MIDAZOLAM HCL 2 MG/2ML IJ SOLN
INTRAMUSCULAR | Status: AC
  Filled 2015-02-21: qty 2

## 2015-02-21 MED ORDER — ONDANSETRON HCL 4 MG/2ML IJ SOLN
INTRAMUSCULAR | Status: AC
  Filled 2015-02-21: qty 2

## 2015-02-21 MED ORDER — SCOPOLAMINE 1 MG/3DAYS TD PT72: 1.0000 | MEDICATED_PATCH | Freq: Once | TRANSDERMAL | Status: DC | PRN

## 2015-02-21 MED ORDER — CEFAZOLIN SODIUM-DEXTROSE 2-3 GM-% IV SOLR
INTRAVENOUS | Status: AC
  Filled 2015-02-21: qty 50

## 2015-02-21 MED ORDER — DEXAMETHASONE SODIUM PHOSPHATE 4 MG/ML IJ SOLN
INTRAMUSCULAR | Status: DC | PRN
  Administered 2015-02-21: 10 mg via INTRAVENOUS

## 2015-02-21 MED ORDER — PROPOFOL 10 MG/ML IV BOLUS
INTRAVENOUS | Status: AC
  Filled 2015-02-21: qty 40

## 2015-02-21 MED ORDER — MIDAZOLAM HCL 2 MG/2ML IJ SOLN: 1.0000 mg | INTRAMUSCULAR | Status: DC | PRN

## 2015-02-21 MED ORDER — LACTATED RINGERS IV SOLN
INTRAVENOUS | Status: DC
  Administered 2015-02-21: 09:00:00 via INTRAVENOUS

## 2015-02-21 MED ORDER — CEFAZOLIN SODIUM-DEXTROSE 2-3 GM-% IV SOLR
2.0000 g | INTRAVENOUS | Status: AC
  Administered 2015-02-21: 2 g via INTRAVENOUS

## 2015-02-21 MED ORDER — BUPIVACAINE HCL (PF) 0.25 % IJ SOLN
INTRAMUSCULAR | Status: DC | PRN
  Administered 2015-02-21: 10 mL

## 2015-02-21 MED ORDER — FENTANYL CITRATE (PF) 100 MCG/2ML IJ SOLN
INTRAMUSCULAR | Status: DC | PRN
  Administered 2015-02-21: 100 ug via INTRAVENOUS

## 2015-02-21 SURGICAL SUPPLY — 62 items
APPLIER CLIP 9.375 MED OPEN (MISCELLANEOUS)
APR CLP MED 9.3 20 MLT OPN (MISCELLANEOUS)
BINDER BREAST LRG (GAUZE/BANDAGES/DRESSINGS) IMPLANT
BINDER BREAST MEDIUM (GAUZE/BANDAGES/DRESSINGS) ×2 IMPLANT
BINDER BREAST XLRG (GAUZE/BANDAGES/DRESSINGS) IMPLANT
BINDER BREAST XXLRG (GAUZE/BANDAGES/DRESSINGS) IMPLANT
BLADE SURG 15 STRL LF DISP TIS (BLADE) ×1 IMPLANT
BLADE SURG 15 STRL SS (BLADE) ×3
CANISTER SUC SOCK COL 7IN (MISCELLANEOUS) IMPLANT
CANISTER SUCT 1200ML W/VALVE (MISCELLANEOUS) IMPLANT
CHLORAPREP W/TINT 26ML (MISCELLANEOUS) ×3 IMPLANT
CLIP APPLIE 9.375 MED OPEN (MISCELLANEOUS) IMPLANT
CLOSURE WOUND 1/2 X4 (GAUZE/BANDAGES/DRESSINGS) ×1
COVER BACK TABLE 60X90IN (DRAPES) ×3 IMPLANT
COVER MAYO STAND STRL (DRAPES) ×3 IMPLANT
COVER PROBE W GEL 5X96 (DRAPES) ×3 IMPLANT
DECANTER SPIKE VIAL GLASS SM (MISCELLANEOUS) ×2 IMPLANT
DEVICE DUBIN W/COMP PLATE 8390 (MISCELLANEOUS) ×3 IMPLANT
DRAPE LAPAROSCOPIC ABDOMINAL (DRAPES) ×3 IMPLANT
DRSG TEGADERM 4X4.75 (GAUZE/BANDAGES/DRESSINGS) IMPLANT
ELECT COATED BLADE 2.86 ST (ELECTRODE) ×3 IMPLANT
ELECT REM PT RETURN 9FT ADLT (ELECTROSURGICAL) ×3
ELECTRODE REM PT RTRN 9FT ADLT (ELECTROSURGICAL) ×1 IMPLANT
GLOVE BIO SURGEON STRL SZ 6.5 (GLOVE) ×1 IMPLANT
GLOVE BIO SURGEON STRL SZ7 (GLOVE) ×6 IMPLANT
GLOVE BIO SURGEONS STRL SZ 6.5 (GLOVE) ×1
GLOVE BIOGEL PI IND STRL 7.0 (GLOVE) IMPLANT
GLOVE BIOGEL PI IND STRL 7.5 (GLOVE) ×1 IMPLANT
GLOVE BIOGEL PI INDICATOR 7.0 (GLOVE) ×2
GLOVE BIOGEL PI INDICATOR 7.5 (GLOVE) ×2
GLOVE EXAM NITRILE MD LF STRL (GLOVE) ×2 IMPLANT
GOWN STRL REUS W/ TWL LRG LVL3 (GOWN DISPOSABLE) ×2 IMPLANT
GOWN STRL REUS W/TWL LRG LVL3 (GOWN DISPOSABLE) ×6
ILLUMINATOR WAVEGUIDE N/F (MISCELLANEOUS) IMPLANT
KIT MARKER MARGIN INK (KITS) ×3 IMPLANT
LIGHT WAVEGUIDE WIDE FLAT (MISCELLANEOUS) IMPLANT
LIQUID BAND (GAUZE/BANDAGES/DRESSINGS) ×3 IMPLANT
MARKER SKIN DUAL TIP RULER LAB (MISCELLANEOUS) ×1 IMPLANT
NDL HYPO 25X1 1.5 SAFETY (NEEDLE) ×1 IMPLANT
NEEDLE HYPO 25X1 1.5 SAFETY (NEEDLE) ×3 IMPLANT
NS IRRIG 1000ML POUR BTL (IV SOLUTION) IMPLANT
PACK BASIN DAY SURGERY FS (CUSTOM PROCEDURE TRAY) ×3 IMPLANT
PENCIL BUTTON HOLSTER BLD 10FT (ELECTRODE) ×3 IMPLANT
SLEEVE SCD COMPRESS KNEE MED (MISCELLANEOUS) ×3 IMPLANT
SPONGE GAUZE 4X4 12PLY STER LF (GAUZE/BANDAGES/DRESSINGS) IMPLANT
SPONGE LAP 4X18 X RAY DECT (DISPOSABLE) ×3 IMPLANT
STAPLER VISISTAT 35W (STAPLE) IMPLANT
STRIP CLOSURE SKIN 1/2X4 (GAUZE/BANDAGES/DRESSINGS) ×2 IMPLANT
SUT MNCRL AB 4-0 PS2 18 (SUTURE) ×3 IMPLANT
SUT MON AB 5-0 PS2 18 (SUTURE) IMPLANT
SUT SILK 2 0 SH (SUTURE) IMPLANT
SUT VIC AB 2-0 SH 27 (SUTURE) ×3
SUT VIC AB 2-0 SH 27XBRD (SUTURE) ×1 IMPLANT
SUT VIC AB 3-0 SH 27 (SUTURE) ×3
SUT VIC AB 3-0 SH 27X BRD (SUTURE) ×1 IMPLANT
SUT VIC AB 5-0 PS2 18 (SUTURE) ×2 IMPLANT
SYR CONTROL 10ML LL (SYRINGE) ×3 IMPLANT
TOWEL OR 17X24 6PK STRL BLUE (TOWEL DISPOSABLE) ×3 IMPLANT
TOWEL OR NON WOVEN STRL DISP B (DISPOSABLE) ×3 IMPLANT
TUBE CONNECTING 20'X1/4 (TUBING)
TUBE CONNECTING 20X1/4 (TUBING) IMPLANT
YANKAUER SUCT BULB TIP NO VENT (SUCTIONS) IMPLANT

## 2015-02-21 NOTE — Anesthesia Procedure Notes (Signed)
Procedure Name: LMA Insertion Date/Time: 02/21/2015 9:23 AM Performed by: Toula Moos L Pre-anesthesia Checklist: Patient identified, Emergency Drugs available, Suction available, Patient being monitored and Timeout performed Patient Re-evaluated:Patient Re-evaluated prior to inductionOxygen Delivery Method: Circle System Utilized Preoxygenation: Pre-oxygenation with 100% oxygen Intubation Type: IV induction Ventilation: Mask ventilation without difficulty LMA: LMA inserted LMA Size: 4.0 Number of attempts: 1 Airway Equipment and Method: Bite block Placement Confirmation: positive ETCO2 Tube secured with: Tape Dental Injury: Teeth and Oropharynx as per pre-operative assessment

## 2015-02-21 NOTE — Anesthesia Postprocedure Evaluation (Signed)
Anesthesia Post Note  Patient: Emily Valentine  Procedure(s) Performed: Procedure(s) (LRB): RIGHT BREAST LUMPECTOMY WITH RADIOACTIVE SEED LOCALIZATION (Right)  Patient location during evaluation: PACU Anesthesia Type: General Level of consciousness: awake and alert Pain management: pain level controlled Vital Signs Assessment: post-procedure vital signs reviewed and stable Respiratory status: spontaneous breathing, nonlabored ventilation, respiratory function stable and patient connected to nasal cannula oxygen Cardiovascular status: blood pressure returned to baseline and stable Postop Assessment: no signs of nausea or vomiting Anesthetic complications: no    Last Vitals:  Filed Vitals:   02/21/15 1036 02/21/15 1115  BP:  156/96  Pulse: 82 65  Temp:  36.4 C  Resp: 15 14    Last Pain: There were no vitals filed for this visit.               Lessly Stigler JENNETTE

## 2015-02-21 NOTE — Op Note (Signed)
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  We discussed options. She and I discussed seed guided excision of this mass. She had a seed placed prior to beginning and I had her mm in the OR.   Procedure: After informed consent was obtained the patient was taken to the operating room. She was given antibiotics. Sequential compression devices were on her legs. She was then placed under general anesthesia without complication. She was prepped and draped in the standard sterile surgical fashion. A surgical timeout was then performed.  I infiltrated 1% lidocaine and made a periareolar incision after locating the seed. I then removed the seed and the surrounding very dense tissue.  Hemostasis was observed. I painted the specimen. Mammogram confirmed removal of the seed and clip. I then closed this with 2-0 Vicryl, 3-0 Vicryl, and 5-0 Monocryl. Glue was placed over the wound.She tolerated this well was extubated and transferred to recovery in stable condition

## 2015-02-21 NOTE — Anesthesia Preprocedure Evaluation (Addendum)
Anesthesia Evaluation  Patient identified by MRN, date of birth, ID band Patient awake    Reviewed: Allergy & Precautions, NPO status , Patient's Chart, lab work & pertinent test results  History of Anesthesia Complications Negative for: history of anesthetic complications  Airway Mallampati: II  TM Distance: >3 FB Neck ROM: Full    Dental no notable dental hx. (+) Dental Advisory Given   Pulmonary Current Smoker,    Pulmonary exam normal breath sounds clear to auscultation       Cardiovascular hypertension, Pt. on medications Normal cardiovascular exam+ dysrhythmias (RBBB)  Rhythm:Regular Rate:Normal     Neuro/Psych negative neurological ROS  negative psych ROS   GI/Hepatic negative GI ROS, Neg liver ROS,   Endo/Other  Hypothyroidism   Renal/GU negative Renal ROS  negative genitourinary   Musculoskeletal negative musculoskeletal ROS (+)   Abdominal   Peds negative pediatric ROS (+)  Hematology negative hematology ROS (+)   Anesthesia Other Findings   Reproductive/Obstetrics negative OB ROS                            Anesthesia Physical Anesthesia Plan  ASA: II  Anesthesia Plan: General   Post-op Pain Management:    Induction: Intravenous  Airway Management Planned: LMA  Additional Equipment:   Intra-op Plan:   Post-operative Plan: Extubation in OR  Informed Consent: I have reviewed the patients History and Physical, chart, labs and discussed the procedure including the risks, benefits and alternatives for the proposed anesthesia with the patient or authorized representative who has indicated his/her understanding and acceptance.   Dental advisory given  Plan Discussed with: CRNA  Anesthesia Plan Comments:         Anesthesia Quick Evaluation

## 2015-02-21 NOTE — H&P (Signed)
64 yof who has no family history and no real personal history of breast disease presents after screening mm. density is d. had possible mass in right breast. mm confirmed a round mass in right breast at 6 oclock. US shows multiple dilated ducts within retroareolar breast with material present. 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 she has no nipple discharge.   Other Problems Arthritis Asthma Chest pain Hemorrhoids Hepatitis High blood pressure Lump In Breast Thyroid Cancer Thyroid Disease  Past Surgical History  Hysterectomy (not due to cancer) - Complete Thyroid Surgery  Allergies  OxyCODONE HCl (Abuse Deter) *ANALGESICS - OPIOID*  Medication History  Aspirin (81MG  Tablet Chewable, Oral) Active. Vitamin D3 (1000UNIT Tablet, Oral) Active. Vitamin B-12 (1000MCG Tablet, Oral) Active. Estradiol (0.1MG  Patch Weekly, Transdermal) Active. Levothyroxine Sodium (100MCG Tablet, Oral) Active. Losartan Potassium-HCTZ (100-25MG  Tablet, Oral) Active. Magnesium (200MG  Tablet, Oral) Active. Meloxicam (15MG  Tablet, Oral) Active. Metoprolol Tartrate (25MG  Tablet, Oral) Active. Multivitamins (Oral) Active. Nitrostat (0.4MG  Tab Sublingual, Sublingual as needed) Active. Medications Reconciled  Social History  Caffeine use Coffee. No alcohol use No drug use Tobacco use Current every day smoker.  Family History  Arthritis Mother. Breast Cancer Sister. Diabetes Mellitus Sister. Hypertension Brother, Father, Mother, Sister. Ischemic Bowel Disease Sister. Prostate Cancer Brother. Respiratory Condition Brother. Thyroid problems Brother, Mother, Sister.  Pregnancy / Birth History Age at menarche 44 years. Age of menopause 16-55 Gravida 3 Maternal age 37-20 Para 3  Review of Systems  General Not Present- Appetite Loss, Chills, Fatigue, Fever, Night Sweats, Weight Gain and Weight Loss. Skin Not Present-  Change in Wart/Mole, Dryness, Hives, Jaundice, New Lesions, Non-Healing Wounds, Rash and Ulcer. HEENT Present- Seasonal Allergies. Not Present- Earache, Hearing Loss, Hoarseness, Nose Bleed, Oral Ulcers, Ringing in the Ears, Sinus Pain, Sore Throat, Visual Disturbances, Wears glasses/contact lenses and Yellow Eyes. Respiratory Present- Chronic Cough. Not Present- Bloody sputum, Difficulty Breathing, Snoring and Wheezing. Breast Present- Breast Pain. Not Present- Breast Mass, Nipple Discharge and Skin Changes. Cardiovascular Not Present- Chest Pain, Difficulty Breathing Lying Down, Leg Cramps, Palpitations, Rapid Heart Rate, Shortness of Breath and Swelling of Extremities. Gastrointestinal Present- Change in Bowel Habits, Constipation and Nausea. Not Present- Abdominal Pain, Bloating, Bloody Stool, Chronic diarrhea, Difficulty Swallowing, Excessive gas, Gets full quickly at meals, Hemorrhoids, Indigestion, Rectal Pain and Vomiting. Female Genitourinary Not Present- Frequency, Nocturia, Painful Urination, Pelvic Pain and Urgency. Musculoskeletal Not Present- Back Pain, Joint Pain, Joint Stiffness, Muscle Pain, Muscle Weakness and Swelling of Extremities. Neurological Not Present- Decreased Memory, Fainting, Headaches, Numbness, Seizures, Tingling, Tremor, Trouble walking and Weakness. Psychiatric Not Present- Anxiety, Bipolar, Change in Sleep Pattern, Depression, Fearful and Frequent crying. Endocrine Not Present- Cold Intolerance, Excessive Hunger, Hair Changes, Heat Intolerance, Hot flashes and New Diabetes. Hematology Not Present- Easy Bruising, Excessive bleeding, Gland problems, HIV and Persistent Infections.  Vitals  Weight: 135 lb Height: 63in Body Surface Area: 1.64 m Body Mass Index: 23.91 kg/m  Pulse: 72 (Regular)  BP: 120/80 (Sitting, Left Arm, Standard)  Physical Exam  General Mental Status-Alert. Orientation-Oriented X3. Chest and Lung Exam Chest and lung exam  reveals -on auscultation, normal breath sounds, no adventitious sounds and normal vocal resonance. Breast Nipples-No Discharge. Breast Lump-No Palpable Breast Mass. Cardiovascular Cardiovascular examination reveals -normal heart sounds, regular rate and rhythm with no murmurs. Lymphatic Head & Neck General Head & Neck Lymphatics: Bilateral - Description - Normal. Axillary General Axillary Region: Bilateral - Description - Normal. Note: no Comfrey adenopathy  Assessment & Plan  BREAST MASS,  RIGHT (N63) Story: discussed observation vs surgery. we discussed at length and we will proceed with seed guided excision of the papilloma. risks, recovery discussed

## 2015-02-21 NOTE — Discharge Instructions (Signed)
[facility] °Office Phone Number [phone] ° °POST OP INSTRUCTIONS ° °Always review your discharge instruction sheet given to you by the facility where your surgery was performed. ° °IF YOU HAVE DISABILITY OR FAMILY LEAVE FORMS, YOU MUST BRING THEM TO THE OFFICE FOR PROCESSING.  DO NOT GIVE THEM TO YOUR DOCTOR. ° °1. A prescription for pain medication may be given to you upon discharge.  Take your pain medication as prescribed, if needed.  If narcotic pain medicine is not needed, then you may take acetaminophen (Tylenol), naprosyn (Alleve) or ibuprofen (Advil) as needed. °2. Take your usually prescribed medications unless otherwise directed °3. If you need a refill on your pain medication, please contact your pharmacy.  They will contact our office to request authorization.  Prescriptions will not be filled after 5pm or on week-ends. °4. You should eat very light the first 24 hours after surgery, such as soup, crackers, pudding, etc.  Resume your normal diet the day after surgery. °5. Most patients will experience some swelling and bruising in the breast.  Ice packs and a good support bra will help.  Wear the breast binder provided or a sports bra for 72 hours day and night.  After that wear a sports bra during the day until you return to the office. Swelling and bruising can take several days to resolve.  °6. It is common to experience some constipation if taking pain medication after surgery.  Increasing fluid intake and taking a stool softener will usually help or prevent this problem from occurring.  A mild laxative (Milk of Magnesia or Miralax) should be taken according to package directions if there are no bowel movements after 48 hours. °7. Unless discharge instructions indicate otherwise, you may remove your bandages 48 hours after surgery and you may shower at that time.  You may have steri-strips (small skin tapes) in place directly over the incision.  These strips should be left on the  skin for 7-10 days and will come off on their own.  If your surgeon used skin glue on the incision, you may shower in 24 hours.  The glue will flake off over the next 2-3 weeks.  Any sutures or staples will be removed at the office during your follow-up visit. °8. ACTIVITIES:  You may resume regular daily activities (gradually increasing) beginning the next day.  Wearing a good support bra or sports bra minimizes pain and swelling.  You may have sexual intercourse when it is comfortable. °a. You may drive when you no longer are taking prescription pain medication, you can comfortably wear a seatbelt, and you can safely maneuver your car and apply brakes. °b. RETURN TO WORK:  ______________________________________________________________________________________ °9. You should see your doctor in the office for a follow-up appointment approximately two weeks after your surgery.  Your doctor’s nurse will typically make your follow-up appointment when she calls you with your pathology report.  Expect your pathology report 3-4 business days after your surgery.  You may call to check if you do not hear from us after three days. °10. OTHER INSTRUCTIONS: _______________________________________________________________________________________________ _____________________________________________________________________________________________________________________________________ °_____________________________________________________________________________________________________________________________________ °_____________________________________________________________________________________________________________________________________ ° °WHEN TO CALL DR WAKEFIELD: °1. Fever over 101.0 °2. Nausea and/or vomiting. °3. Extreme swelling or bruising. °4. Continued bleeding from incision. °5. Increased pain, redness, or drainage from the incision. ° °The clinic staff is available to answer your questions during regular  business hours.  Please don’t hesitate to call and ask to speak to one of the nurses for clinical concerns.  If   If you have a medical emergency, go to the nearest emergency room or call 911.  A surgeon from [facility] is always on call at the hospital. ° °For further questions, please visit [email or web address] ° ° ° ° °Post Anesthesia Home Care Instructions ° °Activity: °Get plenty of rest for the remainder of the day. A responsible adult should stay with you for 24 hours following the procedure.  °For the next 24 hours, DO NOT: °-Drive a car °-Operate machinery °-Drink alcoholic beverages °-Take any medication unless instructed by your physician °-Make any legal decisions or sign important papers. ° °Meals: °Start with liquid foods such as gelatin or soup. Progress to regular foods as tolerated. Avoid greasy, spicy, heavy foods. If nausea and/or vomiting occur, drink only clear liquids until the nausea and/or vomiting subsides. Call your physician if vomiting continues. ° °Special Instructions/Symptoms: °Your throat may feel dry or sore from the anesthesia or the breathing tube placed in your throat during surgery. If this causes discomfort, gargle with warm salt water. The discomfort should disappear within 24 hours. ° °If you had a scopolamine patch placed behind your ear for the management of post- operative nausea and/or vomiting: ° °1. The medication in the patch is effective for 72 hours, after which it should be removed.  Wrap patch in a tissue and discard in the trash. Wash hands thoroughly with soap and water. °2. You may remove the patch earlier than 72 hours if you experience unpleasant side effects which may include dry mouth, dizziness or visual disturbances. °3. Avoid touching the patch. Wash your hands with soap and water after contact with the patch. ° ° °Call your surgeon if you experience:  ° °1.  Fever over 101.0. °2.  Inability to urinate. °3.  Nausea and/or vomiting. °4.   Extreme swelling or bruising at the surgical site. °5.  Continued bleeding from the incision. °6.  Increased pain, redness or drainage from the incision. °7.  Problems related to your pain medication. °8. Any change in color, movement and/or sensation °9. Any problems and/or concerns °  ° °

## 2015-02-21 NOTE — Interval H&P Note (Signed)
History and Physical Interval Note:  02/21/2015 9:07 AM  Emily Valentine  has presented today for surgery, with the diagnosis of RIGHT BREAST MASS  The various methods of treatment have been discussed with the patient and family. After consideration of risks, benefits and other options for treatment, the patient has consented to  Procedure(s): RIGHT BREAST LUMPECTOMY WITH RADIOACTIVE SEED LOCALIZATION (Right) as a surgical intervention .  The patient's history has been reviewed, patient examined, no change in status, stable for surgery.  I have reviewed the patient's chart and labs.  Questions were answered to the patient's satisfaction.     Subrena Devereux

## 2015-02-21 NOTE — Transfer of Care (Signed)
Immediate Anesthesia Transfer of Care Note  Patient: Emily Valentine  Procedure(s) Performed: Procedure(s): RIGHT BREAST LUMPECTOMY WITH RADIOACTIVE SEED LOCALIZATION (Right)  Patient Location: PACU  Anesthesia Type:General  Level of Consciousness: awake and patient cooperative  Airway & Oxygen Therapy: Patient Spontanous Breathing and Patient connected to face mask oxygen  Post-op Assessment: Report given to RN and Post -op Vital signs reviewed and stable  Post vital signs: Reviewed and stable  Last Vitals:  Filed Vitals:   02/21/15 1004 02/21/15 1005  BP:    Pulse: 71 68  Temp:    Resp: 13 17    Complications: No apparent anesthesia complications

## 2015-02-22 ENCOUNTER — Encounter (HOSPITAL_BASED_OUTPATIENT_CLINIC_OR_DEPARTMENT_OTHER): Payer: Self-pay | Admitting: General Surgery

## 2015-03-13 ENCOUNTER — Other Ambulatory Visit: Payer: Self-pay | Admitting: *Deleted

## 2015-03-13 ENCOUNTER — Telehealth: Payer: Self-pay | Admitting: Obstetrics and Gynecology

## 2015-03-13 ENCOUNTER — Other Ambulatory Visit: Payer: Self-pay | Admitting: Obstetrics and Gynecology

## 2015-03-13 DIAGNOSIS — R232 Flushing: Secondary | ICD-10-CM

## 2015-03-13 MED ORDER — ESTRADIOL 0.05 MG/24HR TD PTTW: 1.0000 | MEDICATED_PATCH | TRANSDERMAL | Status: DC

## 2015-03-13 NOTE — Telephone Encounter (Signed)
Spoke with pt. Pt states her Vivelle Dot requires prior auth. Pt to call [facility] and have them fax over the prior auth form. [place]

## 2015-03-13 NOTE — Telephone Encounter (Signed)
Pt called stating that she needs to speak with a nurse regarding a letter she got in the mail about a medication. Please contact pt

## 2015-03-13 NOTE — Telephone Encounter (Signed)
vivelle dot reduced to 0.05 mg/d, used biweekly

## 2015-03-27 ENCOUNTER — Encounter: Payer: Self-pay | Admitting: Cardiology

## 2015-04-04 ENCOUNTER — Ambulatory Visit (INDEPENDENT_AMBULATORY_CARE_PROVIDER_SITE_OTHER): Payer: Medicare Other | Admitting: Cardiology

## 2015-04-04 ENCOUNTER — Encounter: Payer: Self-pay | Admitting: Cardiology

## 2015-04-04 VITALS — BP 152/90 | HR 82 | Ht 63.0 in | Wt 135.0 lb

## 2015-04-04 DIAGNOSIS — I493 Ventricular premature depolarization: Secondary | ICD-10-CM | POA: Diagnosis not present

## 2015-04-04 DIAGNOSIS — Z720 Tobacco use: Secondary | ICD-10-CM

## 2015-04-04 DIAGNOSIS — I10 Essential (primary) hypertension: Secondary | ICD-10-CM

## 2015-04-04 NOTE — Patient Instructions (Addendum)
Medication Instructions:  Your physician recommends that you continue on your current medications as directed. Please refer to the Current Medication list given to you today.  Labwork: none  Testing/Procedures: none  Follow-Up: Your physician wants you to follow-up in: 6 month ov/ekg with Dr Vilinda Boehringer will receive a reminder letter in the mail two months in advance. If you don't receive a letter, please call our office to schedule the follow-up appointment.  Any Other Special Instructions Will Be Listed Below (If Applicable). MONITOR YOUR BLOOD PRESSURE AT HOME   YOU NEED TO STOP SMOKING  Smoking Cessation, Tips for Success If you are ready to quit smoking, congratulations! You have chosen to help yourself be healthier. Cigarettes bring nicotine, tar, carbon monoxide, and other irritants into your body. Your lungs, heart, and blood vessels will be able to work better without these poisons. There are many different ways to quit smoking. Nicotine gum, nicotine patches, a nicotine inhaler, or nicotine nasal spray can help with physical craving. Hypnosis, support groups, and medicines help break the habit of smoking. WHAT THINGS CAN I DO TO MAKE QUITTING EASIER?  Here are some tips to help you quit for good:  Pick a date when you will quit smoking completely. Tell all of your friends and family about your plan to quit on that date.  Do not try to slowly cut down on the number of cigarettes you are smoking. Pick a quit date and quit smoking completely starting on that day.  Throw away all cigarettes.   Clean and remove all ashtrays from your home, work, and car.  On a card, write down your reasons for quitting. Carry the card with you and read it when you get the urge to smoke.  Cleanse your body of nicotine. Drink enough water and fluids to keep your urine clear or pale yellow. Do this after quitting to flush the nicotine from your body.  Learn to predict your moods. Do not let a bad  situation be your excuse to have a cigarette. Some situations in your life might tempt you into wanting a cigarette.  Never have "just one" cigarette. It leads to wanting another and another. Remind yourself of your decision to quit.  Change habits associated with smoking. If you smoked while driving or when feeling stressed, try other activities to replace smoking. Stand up when drinking your coffee. Brush your teeth after eating. Sit in a different chair when you read the paper. Avoid alcohol while trying to quit, and try to drink fewer caffeinated beverages. Alcohol and caffeine may urge you to smoke.  Avoid foods and drinks that can trigger a desire to smoke, such as sugary or spicy foods and alcohol.  Ask people who smoke not to smoke around you.  Have something planned to do right after eating or having a cup of coffee. For example, plan to take a walk or exercise.  Try a relaxation exercise to calm you down and decrease your stress. Remember, you may be tense and nervous for the first 2 weeks after you quit, but this will pass.  Find new activities to keep your hands busy. Play with a pen, coin, or rubber band. Doodle or draw things on paper.  Brush your teeth right after eating. This will help cut down on the craving for the taste of tobacco after meals. You can also try mouthwash.   Use oral substitutes in place of cigarettes. Try using lemon drops, carrots, cinnamon sticks, or chewing gum.  Keep them handy so they are available when you have the urge to smoke.  When you have the urge to smoke, try deep breathing.  Designate your home as a nonsmoking area.  If you are a heavy smoker, ask your health care provider about a prescription for nicotine chewing gum. It can ease your withdrawal from nicotine.  Reward yourself. Set aside the cigarette money you save and buy yourself something nice.  Look for support from others. Join a support group or smoking cessation program. Ask  someone at home or at work to help you with your plan to quit smoking.  Always ask yourself, "Do I need this cigarette or is this just a reflex?" Tell yourself, "Today, I choose not to smoke," or "I do not want to smoke." You are reminding yourself of your decision to quit.  Do not replace cigarette smoking with electronic cigarettes (commonly called e-cigarettes). The safety of e-cigarettes is unknown, and some may contain harmful chemicals.  If you relapse, do not give up! Plan ahead and think about what you will do the next time you get the urge to smoke. HOW WILL I FEEL WHEN I QUIT SMOKING? You may have symptoms of withdrawal because your body is used to nicotine (the addictive substance in cigarettes). You may crave cigarettes, be irritable, feel very hungry, cough often, get headaches, or have difficulty concentrating. The withdrawal symptoms are only temporary. They are strongest when you first quit but will go away within 10-14 days. When withdrawal symptoms occur, stay in control. Think about your reasons for quitting. Remind yourself that these are signs that your body is healing and getting used to being without cigarettes. Remember that withdrawal symptoms are easier to treat than the major diseases that smoking can cause.  Even after the withdrawal is over, expect periodic urges to smoke. However, these cravings are generally short lived and will go away whether you smoke or not. Do not smoke! WHAT RESOURCES ARE AVAILABLE TO HELP ME QUIT SMOKING? Your health care provider can direct you to community resources or hospitals for support, which may include:  Group support.  Education.  Hypnosis.  Therapy.   This information is not intended to replace advice given to you by your health care provider. Make sure you discuss any questions you have with your health care provider.   Document Released: 11/29/2003 Document Revised: 03/23/2014 Document Reviewed: 08/18/2012 Elsevier  Interactive Patient Education [employer].    If you need a refill on your cardiac medications before your next appointment, please call your pharmacy.

## 2015-04-04 NOTE — Progress Notes (Signed)
Cardiology Office Note   Date:  04/04/2015   ID:  NAJIAH Valentine, DOB Jan 25, 1951, MRN [id number]  PCP:  Purvis Kilts, MD  Cardiologist: Darlin Coco MD  Chief Complaint  Patient presents with  . routine follow up    Patient denies chest pain, shortness of breath, le edema, or claudication      History of Present Illness: Emily Valentine is a 65 y.o. female who presents for scheduled 6 month follow-up visit  This pleasant 65 year old African American woman is seen for a followup office visit.  She was initially sent to [place] because of an abnormal EKG showing right bundle branch block with left axis deviation.We evaluated her at that time with a Lexa scan Myoview which was normal and we were able to clear her for podiatry surgery.  She was seen on 10/04/13 for complaints of chest tightness and exertional dyspnea. She underwent a echocardiogram as well as a treadmill Myoview, both studies on 10/16/13. The Myoview showed no evidence of ischemia and her ejection fraction was 56%. The echocardiogram showed an ejection fraction of 65-70% with vigorous contraction and no significant valve abnormalities.since last visit she has been on low dose Toprol 25 mg daily and has felt better. Her PVCs have diminished. She denies any chest discomfort or shortness of breath. She is not having any trouble sleeping at night.she is still smoking about a pack of cigarettes a day against advice.  She does have some exertional dyspnea climbing steps.  Her blood pressure here in the office today was slightly elevated.  She is going to start checking her blood pressures at home.  She has a home monitor.  We will continue current medication for now.   Past Medical History  Diagnosis Date  . Hypertension   . Pernicious anemia   . S/P colonoscopy 2001, 2004    2001: hyperplastic polyp, 2004: normal, inflammatory polyp  . Hypothyroid   . History of echocardiogram     Echo (8/15): Vigorous LV  function, EF 65-70%, normal wall motion, grade 1 diastolic dysfunction, mild to moderate TR, PASP 31 mm Hg  . Hx of cardiovascular stress test     ETT-Myoview (8/15): no ischemia, EF 56%, normal study    Past Surgical History  Procedure Laterality Date  . Thyroidectomy    . Abdominal hysterectomy    . Back surgery    . Colonoscopy  11/10/2010    Procedure: COLONOSCOPY;  Surgeon: Daneil Dolin, MD;  [facility]: [facility];  Service: Endoscopy;  Laterality: N/A;  . Breast lumpectomy with radioactive seed localization Right 02/21/2015    Procedure: RIGHT BREAST LUMPECTOMY WITH RADIOACTIVE SEED LOCALIZATION;  Surgeon: Rolm Bookbinder, MD;  Location: [facility];  Service: General;  Laterality: Right;     Current Outpatient Prescriptions  Medication Sig Dispense Refill  . aspirin EC 81 MG tablet Take 1 tablet (81 mg total) by mouth daily.    . Cyanocobalamin (VITAMIN B-12 IJ) Inject as directed every 30 (thirty) days.      Marland Kitchen estradiol (VIVELLE-DOT) 0.05 MG/24HR patch Place 1 patch (0.05 mg total) onto the skin 2 (two) times a week. 8 patch 12  . folic acid (FOLVITE) 1 MG tablet Take 1 mg by mouth daily.    Marland Kitchen levothyroxine (SYNTHROID, LEVOTHROID) 100 MCG tablet Take 100 mcg by mouth daily.      Marland Kitchen losartan-hydrochlorothiazide (HYZAAR) 100-25 MG per tablet Take 1 tablet by mouth daily.     Marland Kitchen  magnesium hydroxide (MILK OF MAGNESIA) 400 MG/5ML suspension Take 30 mLs by mouth daily as needed (constipation).     . meloxicam (MOBIC) 15 MG tablet Take 15 mg by mouth daily.    . Methotrexate, Anti-Rheumatic, (METHOTREXATE, PF, [place]) Inject 10 mg into the skin once a week.     . metoprolol succinate (TOPROL-XL) 25 MG 24 hr tablet TAKE (1) TABLET BY MOUTH DAILY. 90 tablet 3  . Multiple Vitamin (MULTIVITAMIN) tablet Take 1 tablet by mouth daily.    . nitroGLYCERIN (NITROSTAT) 0.4 MG SL tablet Place 1 tablet (0.4 mg total) under the tongue every 5 (five) minutes as needed for chest pain. 25  tablet 3   No current facility-administered medications for this visit.    Allergies:   Oxycontin    Social History:  The patient  reports that she has been smoking Cigarettes.  She has a 20 pack-year smoking history. She has never used smokeless tobacco. She reports that she does not drink alcohol or use illicit drugs.   Family History:  The patient's family history includes Anemia in her father, mother, and sister; Cancer in her brother, father, and mother; Colon cancer in her father; Diabetes in her sister; Hypertension in her brother, father, mother, and sister; Stomach cancer in her mother; Thyroid disease in her brother, mother, and sister.    ROS:  Please see the history of present illness.   Otherwise, review of systems are positive for none.   All other systems are reviewed and negative.    PHYSICAL EXAM: VS:  BP 152/90 mmHg  Pulse 82  Ht 5\' 3"  (1.6 m)  Wt 135 lb (61.236 kg)  BMI 23.92 kg/m2 , BMI Body mass index is 23.92 kg/(m^2). GEN: Well nourished, well developed, in no acute distress HEENT: normal Neck: no JVD, carotid bruits, or masses Cardiac: RRR; no murmurs, rubs, or gallops,no edema  Respiratory:  clear to auscultation bilaterally, normal work of breathing GI: soft, nontender, nondistended, + BS MS: no deformity or atrophy Skin: warm and dry, no rash Neuro:  Strength and sensation are intact Psych: euthymic mood, full affect   EKG:  EKG is not ordered today.   Recent Labs: 02/15/2015: Hemoglobin 13.0; Platelets 320 02/21/2015: BUN 25*; Creatinine, Ser 1.38*; Potassium 3.1*; Sodium 139    Lipid Panel    Component Value Date/Time   CHOL 151 10/16/2013 1023   TRIG 116.0 10/16/2013 1023   HDL 37.50* 10/16/2013 1023   CHOLHDL 4 10/16/2013 1023   VLDL 23.2 10/16/2013 1023   LDLCALC 90 10/16/2013 1023      Wt Readings from Last 3 Encounters:  04/04/15 135 lb (61.236 kg)  02/21/15 134 lb (60.782 kg)  09/05/14 139 lb (63.05 kg)       ASSESSMENT  AND PLAN:  1. essential hypertension 2. noncardiac chest pain. Recent normal treadmill Myoview on 10/16/13 shows no ischemia 3. Occasional PVCs, improved on beta blocker. 4. tobacco abuse.counseled on the need to stop smoking. 5. Right bundle branch block,asymptomatic  Disposition: Continue current dose of Toprol 25 mg daily.. Recheck in 6 months for office visit. Lose weight. Stop smoking. Avoided salt in diet to improve blood pressure further..start monitoring blood pressure at home.  If remains elevated she will contact us.   Current medicines are reviewed at length with the patient today.  The patient does not have concerns regarding medicines.  The following changes have been made:  no change  Labs/ tests ordered today include:  No orders of the  defined types were placed in this encounter.    Disposition: Continue current medication.  Stop smoking.  Recheck in 6 months for office visit and EKG with Dr. Acie Fredrickson   Signed, Darlin Coco MD 04/04/2015 1:04 PM    [address], [place], [place]  [address] Phone: [phone]; Fax: [phone]

## 2015-05-28 ENCOUNTER — Other Ambulatory Visit: Payer: Self-pay | Admitting: Cardiology

## 2015-06-03 ENCOUNTER — Other Ambulatory Visit: Payer: Self-pay | Admitting: *Deleted

## 2015-06-03 MED ORDER — ESTRADIOL 0.05 MG/24HR TD PTTW: 1.0000 | MEDICATED_PATCH | TRANSDERMAL | Status: DC

## 2015-07-09 ENCOUNTER — Telehealth: Payer: Self-pay | Admitting: Obstetrics and Gynecology

## 2015-07-09 ENCOUNTER — Other Ambulatory Visit: Payer: Self-pay | Admitting: Obstetrics and Gynecology

## 2015-07-09 DIAGNOSIS — N951 Menopausal and female climacteric states: Secondary | ICD-10-CM

## 2015-07-09 MED ORDER — VENLAFAXINE HCL ER 37.5 MG PO CP24: 37.5000 mg | ORAL_CAPSULE | Freq: Every day | ORAL | Status: DC

## 2015-07-09 NOTE — Progress Notes (Signed)
Estradiol Rx declined by Medicare. Will try Effexor.

## 2015-07-09 NOTE — Telephone Encounter (Signed)
Switched to Effexor 37.5. Daily. Pt aware and agrees.

## 2015-09-16 NOTE — Progress Notes (Signed)
Cardiology Office Note   Date:  09/18/2015   ID:  Emily Valentine, DOB 1950/10/25, MRN [id number]  PCP:  Emily Kilts, MD  Cardiologist: Emily Coco MD  Chief Complaint  Patient presents with  . PVC    just a little cough & tightness in chest, per pt      History of Present Illness: Emily Valentine is a 65 y.o. female who presents for f/u PVC;s Previous patient of  Dr Emily Valentine  This pleasant 65 y.o.  African American woman is seen for a followup office visit.  She was initially sent to [place] because of an abnormal EKG showing right bundle branch block with left axis deviation.We evaluated her at that time with a Lexa scan Myoview which was normal and we were able to clear her for podiatry surgery.  She was seen on 10/04/13 for complaints of chest tightness and exertional dyspnea. She underwent a echocardiogram as well as a treadmill Myoview, both studies on 10/16/13. The Myoview showed no evidence of ischemia and her ejection fraction was 56%. The echocardiogram showed an ejection fraction of 65-70% with vigorous contraction and no significant valve abnormalities.since last visit she has been on low dose Toprol 25 mg daily and has felt better. Her PVCs have diminished. She denies any chest discomfort or shortness of breath. She is not having any trouble sleeping at night.she is still smoking about a pack of cigarettes a day against advice.  She does have some exertional dyspnea climbing steps.  Her blood pressure here in the office today was slightly elevated.  She is going to start checking her blood pressures at home.  She has a home monitor.  We will continue current medication for now.   Past Medical History  Diagnosis Date  . Hypertension   . Pernicious anemia   . S/P colonoscopy 2001, 2004    2001: hyperplastic polyp, 2004: normal, inflammatory polyp  . Hypothyroid   . History of echocardiogram     Echo (8/15): Vigorous LV function, EF 65-70%, normal wall  motion, grade 1 diastolic dysfunction, mild to moderate TR, PASP 31 mm Hg  . Hx of cardiovascular stress test     ETT-Myoview (8/15): no ischemia, EF 56%, normal study    Past Surgical History  Procedure Laterality Date  . Thyroidectomy    . Abdominal hysterectomy    . Back surgery    . Colonoscopy  11/10/2010    Procedure: COLONOSCOPY;  Surgeon: Emily Dolin, MD;  [facility];  Service: Endoscopy;  Laterality: N/A;  . Breast lumpectomy with radioactive seed localization Right 02/21/2015    Procedure: RIGHT BREAST LUMPECTOMY WITH RADIOACTIVE SEED LOCALIZATION;  Surgeon: Emily Bookbinder, MD;  Location: [facility];  Service: General;  Laterality: Right;     Current Outpatient Prescriptions  Medication Sig Dispense Refill  . aspirin EC 81 MG tablet Take 1 tablet (81 mg total) by mouth daily.    . Cyanocobalamin (VITAMIN B-12 IJ) Inject as directed every 30 (thirty) days.      . folic acid (FOLVITE) 1 MG tablet Take 1 mg by mouth daily.    Marland Kitchen levothyroxine (SYNTHROID, LEVOTHROID) 100 MCG tablet Take 100 mcg by mouth daily.      Marland Kitchen losartan-hydrochlorothiazide (HYZAAR) 100-25 MG per tablet Take 1 tablet by mouth daily.     . magnesium hydroxide (MILK OF MAGNESIA) 400 MG/5ML suspension Take 30 mLs by mouth daily as needed (constipation).     Marland Kitchen  Methotrexate, Anti-Rheumatic, (METHOTREXATE, PF, [place]) Inject 10 mg into the skin once a week.     . metoprolol succinate (TOPROL-XL) 25 MG 24 hr tablet TAKE (1) TABLET BY MOUTH DAILY. 90 tablet 0  . Multiple Vitamin (MULTIVITAMIN) tablet Take 1 tablet by mouth daily.    . nitroGLYCERIN (NITROSTAT) 0.4 MG SL tablet Place 1 tablet (0.4 mg total) under the tongue every 5 (five) minutes as needed for chest pain. 25 tablet 3   No current facility-administered medications for this visit.    Allergies:   Oxycontin    Social History:  The patient  reports that she has been smoking Cigarettes.  She has a 20 pack-year smoking  history. She has never used smokeless tobacco. She reports that she does not drink alcohol or use illicit drugs.   Family History:  The patient's family history includes Anemia in her father, mother, and sister; Cancer in her brother, father, and mother; Colon cancer in her father; Diabetes in her sister; Hypertension in her brother, father, mother, and sister; Stomach cancer in her mother; Thyroid disease in her brother, mother, and sister.    ROS:  Please see the history of present illness.   Otherwise, review of systems are positive for none.   All other systems are reviewed and negative.    PHYSICAL EXAM: VS:  BP 122/86 mmHg  Pulse 75  Ht 5\' 2"  (1.575 m)  Wt 133 lb 1.9 oz (60.383 kg)  BMI 24.34 kg/m2  SpO2 99% , BMI Body mass index is 24.34 kg/(m^2). GEN: Well nourished, well developed, in no acute distress HEENT: normal Neck: no JVD, carotid bruits, or masses Cardiac: RRR; no murmurs, rubs, or gallops,no edema  Respiratory:  clear to auscultation bilaterally, normal work of breathing GI: soft, nontender, nondistended, + BS MS: no deformity or atrophy Skin: warm and dry, no rash Neuro:  Strength and sensation are intact Psych: euthymic mood, full affect   EKG:  08/14/14 SR rate 71 RBBB PVC;s 09/18/15  SR RBB RAE LAD no change   Recent Labs: 02/15/2015: Hemoglobin 13.0; Platelets 320 02/21/2015: BUN 25*; Creatinine, Ser 1.38*; Potassium 3.1*; Sodium 139    Lipid Panel    Component Value Date/Time   CHOL 151 10/16/2013 1023   TRIG 116.0 10/16/2013 1023   HDL 37.50* 10/16/2013 1023   CHOLHDL 4 10/16/2013 1023   VLDL 23.2 10/16/2013 1023   LDLCALC 90 10/16/2013 1023      Wt Readings from Last 3 Encounters:  09/18/15 133 lb 1.9 oz (60.383 kg)  04/04/15 135 lb (61.236 kg)  02/21/15 134 lb (60.782 kg)       ASSESSMENT AND PLAN:  1. essential hypertension  Well controlled.  Continue current medications and low sodium Dash type diet.   2. noncardiac chest pain. Recent  normal treadmill Myoview on 10/16/13 shows no ischemia 3. Occasional PVCs, improved on beta blocker. 4. tobacco abuse.counseled on the need to stop smoking.  Lung cancer Screening  CT ordered at [facility]. Right bundle branch block,asymptomatic  F/u in a year    Jenkins Rouge

## 2015-09-18 ENCOUNTER — Ambulatory Visit (INDEPENDENT_AMBULATORY_CARE_PROVIDER_SITE_OTHER): Payer: Medicare Other | Admitting: Cardiovascular Disease

## 2015-09-18 ENCOUNTER — Encounter: Payer: Self-pay | Admitting: Cardiovascular Disease

## 2015-09-18 VITALS — BP 122/86 | HR 75 | Ht 62.0 in | Wt 133.1 lb

## 2015-09-18 DIAGNOSIS — Z720 Tobacco use: Secondary | ICD-10-CM | POA: Diagnosis not present

## 2015-09-18 DIAGNOSIS — I493 Ventricular premature depolarization: Secondary | ICD-10-CM

## 2015-09-18 DIAGNOSIS — F17200 Nicotine dependence, unspecified, uncomplicated: Secondary | ICD-10-CM

## 2015-09-18 MED ORDER — LOSARTAN POTASSIUM-HCTZ 100-25 MG PO TABS: 1.0000 | ORAL_TABLET | Freq: Every day | ORAL | Status: DC

## 2015-09-18 MED ORDER — NITROGLYCERIN 0.4 MG SL SUBL: 0.4000 mg | SUBLINGUAL_TABLET | SUBLINGUAL | Status: DC | PRN

## 2015-09-18 MED ORDER — METOPROLOL SUCCINATE ER 25 MG PO TB24: ORAL_TABLET | ORAL | Status: DC

## 2015-09-18 NOTE — Patient Instructions (Addendum)
Medication Instructions:  Your physician recommends that you continue on your current medications as directed. Please refer to the Current Medication list given to you today.  Labwork: NONE  Testing/Procedures: Non-Cardiac CT scanning for lung cancer screening, (CAT scanning), is a noninvasive, special x-ray that produces cross-sectional images of the body using x-rays and a computer. CT scans help physicians diagnose and treat medical conditions. For some CT exams, a contrast material is used to enhance visibility in the area of the body being studied. CT scans provide greater clarity and reveal more details than regular x-ray exams.   Follow-Up: Your physician wants you to follow-up in: 12 months with Dr. Johnsie Cancel. You will receive a reminder letter in the mail two months in advance. If you don't receive a letter, please call our office to schedule the follow-up appointment.   If you need a refill on your cardiac medications before your next appointment, please call your pharmacy.

## 2015-09-25 ENCOUNTER — Ambulatory Visit (HOSPITAL_COMMUNITY)
Admission: RE | Admit: 2015-09-25 | Discharge: 2015-09-25 | Disposition: A | Discharge status: Home / Self Care | Payer: Medicare Other | Source: Ambulatory Visit | Attending: Cardiovascular Disease | Admitting: Cardiovascular Disease

## 2015-09-25 DIAGNOSIS — F17200 Nicotine dependence, unspecified, uncomplicated: Secondary | ICD-10-CM

## 2015-09-26 ENCOUNTER — Other Ambulatory Visit: Payer: Self-pay | Admitting: Family Medicine

## 2015-09-26 DIAGNOSIS — R5381 Other malaise: Secondary | ICD-10-CM

## 2015-10-17 ENCOUNTER — Encounter: Payer: Self-pay | Admitting: Internal Medicine

## 2016-05-04 ENCOUNTER — Ambulatory Visit (INDEPENDENT_AMBULATORY_CARE_PROVIDER_SITE_OTHER): Payer: Medicare Other | Admitting: Otolaryngology

## 2016-05-04 DIAGNOSIS — J341 Cyst and mucocele of nose and nasal sinus: Secondary | ICD-10-CM | POA: Diagnosis not present

## 2016-06-15 ENCOUNTER — Other Ambulatory Visit: Payer: Self-pay | Admitting: Cardiovascular Disease

## 2016-06-16 ENCOUNTER — Ambulatory Visit (INDEPENDENT_AMBULATORY_CARE_PROVIDER_SITE_OTHER): Payer: Medicare Other | Admitting: Neurology

## 2016-06-16 ENCOUNTER — Encounter: Payer: Self-pay | Admitting: Neurology

## 2016-06-16 VITALS — BP 144/88 | HR 68 | Temp 97.9°F | Resp 16 | Ht 62.0 in | Wt 130.2 lb

## 2016-06-16 DIAGNOSIS — R413 Other amnesia: Secondary | ICD-10-CM | POA: Diagnosis not present

## 2016-06-16 NOTE — Patient Instructions (Signed)
1. Control of blood pressure, cholesterol, as well as physical exercise and brain stimulation exercises are important for brain health 2. Follow-up in 1 year, call for any changes

## 2016-06-16 NOTE — Progress Notes (Signed)
Forward office note to PCP.

## 2016-06-16 NOTE — Progress Notes (Signed)
NEUROLOGY CONSULTATION NOTE  Emily Valentine MRN: [id number] DOB: May 07, 1950  Referring provider: Dr. Sharilyn Sites Primary care provider: Dr. Sharilyn Sites  Reason for consult: "starting to have some memory issues"  Dear Dr Hilma Favors:  Thank you for your kind referral of Emily Valentine for consultation of the above symptoms. Although her history is well known to you, please allow me to reiterate it for the purpose of our medical record. The patient was accompanied to the clinic by her husband who also provides collateral information. Records and images were personally reviewed where available.  HISTORY OF PRESENT ILLNESS: This is a 66 year old right-handed woman with a history of hypertension, hyperthyroidism, B12 deficiency, presenting for evaluation of memory changes. She states she has always had a hard time "bringing stuff back" for the past 15 years, where she would take notes and have difficulties putting them back into a sentence, but has noticed some worsening in the past 5 years. She has noticed some word-finding difficulties, and states for instance she had difficulty saying her age when asked today. Her husband noticed that over the past year she would go off topic in a conversation or would not catch up as quickly. She eventually would catch up. One time she did not remember getting an injection in her foot, she states this was because it was a traumatic event she did not want to remember. She denies getting lost driving, she is in charge of bill payments and her medications, and has no difficulties managing them. She stays pretty organized has always been detail-oriented, writing things down. She has occasional mood swings, but these are not new, no significant personality changes, no hallucinations. No difficulties with ADLs. There is no family history of dementia, no history of significant head injuries or alcohol intake.   She denies any headaches, dizziness, diplopia, dysarthria,  dysphagia, focal numbness/tingling/weakness, bladder dysfunction, anosmia. She has some neck and back pain, as well as constipation. She has rare tremors on her right hand. She has some balance issues, denies any falls.  She retired in 2013.  PAST MEDICAL HISTORY: Past Medical History:  Diagnosis Date  . History of echocardiogram    Echo (8/15): Vigorous LV function, EF 65-70%, normal wall motion, grade 1 diastolic dysfunction, mild to moderate TR, PASP 31 mm Hg  . Hx of cardiovascular stress test    ETT-Myoview (8/15): no ischemia, EF 56%, normal study  . Hypertension   . Hypothyroid   . Pernicious anemia   . S/P colonoscopy 2001, 2004   2001: hyperplastic polyp, 2004: normal, inflammatory polyp    PAST SURGICAL HISTORY: Past Surgical History:  Procedure Laterality Date  . ABDOMINAL HYSTERECTOMY    . BACK SURGERY    . BREAST LUMPECTOMY WITH RADIOACTIVE SEED LOCALIZATION Right 02/21/2015   Procedure: RIGHT BREAST LUMPECTOMY WITH RADIOACTIVE SEED LOCALIZATION;  Surgeon: Rolm Bookbinder, MD;  Location: [facility];  Service: General;  Laterality: Right;  . COLONOSCOPY  11/10/2010   Procedure: COLONOSCOPY;  Surgeon: Daneil Dolin, MD;  [facility];  Service: Endoscopy;  Laterality: N/A;  . THYROIDECTOMY      MEDICATIONS: Current Outpatient Prescriptions on File Prior to Visit  Medication Sig Dispense Refill  . aspirin EC 81 MG tablet Take 1 tablet (81 mg total) by mouth daily.    . Cyanocobalamin (VITAMIN B-12 IJ) Inject as directed every 30 (thirty) days.      . folic acid (FOLVITE) 1 MG tablet Take 1 mg  by mouth daily.    Marland Kitchen levothyroxine (SYNTHROID, LEVOTHROID) 100 MCG tablet Take 100 mcg by mouth daily.      Marland Kitchen losartan-hydrochlorothiazide (HYZAAR) 100-25 MG tablet Take 1 tablet by mouth daily. 90 tablet 3  . magnesium hydroxide (MILK OF MAGNESIA) 400 MG/5ML suspension Take 30 mLs by mouth daily as needed (constipation).     . Methotrexate,  Anti-Rheumatic, (METHOTREXATE, PF, [place]) Inject 10 mg into the skin once a week.     . metoprolol succinate (TOPROL-XL) 25 MG 24 hr tablet TAKE (1) TABLET BY MOUTH DAILY. 90 tablet 3  . Multiple Vitamin (MULTIVITAMIN) tablet Take 1 tablet by mouth daily.    . nitroGLYCERIN (NITROSTAT) 0.4 MG SL tablet Place 1 tablet (0.4 mg total) under the tongue every 5 (five) minutes as needed for chest pain. 25 tablet 1   No current facility-administered medications on file prior to visit.     ALLERGIES: Allergies  Allergen Reactions  . Oxycontin [Oxycodone] Nausea And Vomiting    FAMILY HISTORY: Family History  Problem Relation Age of Onset  . Colon cancer Father     diagnosed age 74, died at age 52  . Stomach cancer Mother     deceased  . Anemia Father   . Anemia Sister   . Anemia Mother   . Cancer Mother   . Cancer Father   . Cancer Brother   . Diabetes Sister   . Hypertension Father   . Hypertension Mother   . Hypertension Sister   . Hypertension Brother   . Thyroid disease Mother   . Thyroid disease Brother   . Thyroid disease Sister     SOCIAL HISTORY: Social History   Social History  . Marital status: Married    Spouse name: N/A  . Number of children: N/A  . Years of education: N/A   Occupational History  . Not on file.   Social History Main Topics  . Smoking status: Current Every Day Smoker    Packs/day: 1.00    Years: 20.00    Types: Cigarettes  . Smokeless tobacco: Current User  . Alcohol use No  . Drug use: No  . Sexual activity: Yes    Birth control/ protection: Surgical   Other Topics Concern  . Not on file   Social History Narrative  . No narrative on file    REVIEW OF SYSTEMS: Constitutional: No fevers, chills, or sweats, no generalized fatigue, change in appetite Eyes: No visual changes, double vision, eye pain Ear, nose and throat: No hearing loss, ear pain, nasal congestion, sore throat Cardiovascular: No chest pain, palpitations Respiratory:   No shortness of breath at rest or with exertion, wheezes GastrointestinaI: No nausea, vomiting, diarrhea, abdominal pain, fecal incontinence Genitourinary:  No dysuria, urinary retention or frequency Musculoskeletal:  + neck pain, back pain Integumentary: No rash, pruritus, skin lesions Neurological: as above Psychiatric: No depression, insomnia, anxiety Endocrine: No palpitations, fatigue, diaphoresis, mood swings, change in appetite, change in weight, increased thirst Hematologic/Lymphatic:  No anemia, purpura, petechiae. Allergic/Immunologic: no itchy/runny eyes, nasal congestion, recent allergic reactions, rashes  PHYSICAL EXAM: Vitals:   06/16/16 0900  BP: (!) 144/88  Pulse: 68  Resp: 16  Temp: 97.9 F (36.6 C)   General: No acute distress Head:  Normocephalic/atraumatic Eyes: Fundoscopic exam shows bilateral sharp discs, no vessel changes, exudates, or hemorrhages Neck: supple, no paraspinal tenderness, full range of motion Back: No paraspinal tenderness Heart: regular rate and rhythm Lungs: Clear to auscultation bilaterally. Vascular: No  carotid bruits. Skin/Extremities: No rash, no edema Neurological Exam: Mental status: alert and oriented to person, place, and time, no dysarthria or aphasia, Fund of knowledge is appropriate.  Recent and remote memory are intact.  Attention and concentration are normal.    Able to name objects and repeat phrases. Montreal Cognitive Assessment  06/16/2016  Visuospatial/ Executive (0/5) 4  Naming (0/3) 3  Attention: Read list of digits (0/2) 2  Attention: Read list of letters (0/1) 1  Attention: Serial 7 subtraction starting at 100 (0/3) 2  Language: Repeat phrase (0/2) 2  Language : Fluency (0/1) 1  Abstraction (0/2) 2  Delayed Recall (0/5) 4  Orientation (0/6) 6  Total 27   Cranial nerves: CN I: not tested CN II: pupils equal, round and reactive to light, visual fields intact, fundi unremarkable. CN III, IV, VI:  full range of  motion, no nystagmus, no ptosis CN V: facial sensation intact CN VII: upper and lower face symmetric CN VIII: hearing intact to finger rub CN IX, X: gag intact, uvula midline CN XI: sternocleidomastoid and trapezius muscles intact CN XII: tongue midline Bulk & Tone: normal, no fasciculations. Motor: 5/5 throughout with no pronator drift. Sensation: intact to light touch, cold, pin, vibration and joint position sense.  No extinction to double simultaneous stimulation.  Romberg test negative Deep Tendon Reflexes: +1 throughout except for absent ankle jerks bilaterally, no ankle clonus Plantar responses: downgoing bilaterally Cerebellar: no incoordination on finger to nose, heel to shin. No dysdiadochokinesia Gait: narrow-based and steady, difficulty with tandem walk but able Tremor: none  IMPRESSION: This is a 66 year old right-handed woman with a history of hypertension, hyperthyroidism, pernicious anemia, presenting for evaluation of memory changes. Her neurological exam is non-focal, MOCA score normal 27/30 (nl > 26/30). We discussed there is no evidence of a dementia process at this time. We discussed different causes of memory loss. She does have thyroid and B12 issues, they report these are monitored and currently under control. We discussed effects of mood on memory, her husband did note mood swings, she repeatedly states she did not think there was any problem with her memory. Continue to monitor mood. We discussed the importance of control of vascular risk factors, physical exercise, and brain stimulation exercises for brain health. She will follow-up in 1 year and knows to call for any changes.   Thank you for allowing me to participate in the care of this patient. Please do not hesitate to call for any questions or concerns.   Ellouise Newer, M.D.  CC: Dr. Hilma Favors

## 2016-07-30 ENCOUNTER — Ambulatory Visit (INDEPENDENT_AMBULATORY_CARE_PROVIDER_SITE_OTHER): Payer: Medicare Other | Admitting: Otolaryngology

## 2016-07-30 DIAGNOSIS — J341 Cyst and mucocele of nose and nasal sinus: Secondary | ICD-10-CM

## 2016-08-14 DIAGNOSIS — K5792 Diverticulitis of intestine, part unspecified, without perforation or abscess without bleeding: Secondary | ICD-10-CM

## 2016-08-14 HISTORY — DX: Diverticulitis of intestine, part unspecified, without perforation or abscess without bleeding: K57.92

## 2016-08-24 ENCOUNTER — Emergency Department (HOSPITAL_COMMUNITY): Payer: Medicare Other

## 2016-08-24 ENCOUNTER — Encounter (HOSPITAL_COMMUNITY): Payer: Self-pay | Admitting: Emergency Medicine

## 2016-08-24 ENCOUNTER — Emergency Department (HOSPITAL_COMMUNITY)
Admission: EM | Admit: 2016-08-24 | Discharge: 2016-08-24 | Disposition: A | Discharge status: Home / Self Care | Payer: Medicare Other | Attending: Emergency Medicine | Admitting: Emergency Medicine

## 2016-08-24 DIAGNOSIS — K5732 Diverticulitis of large intestine without perforation or abscess without bleeding: Secondary | ICD-10-CM | POA: Diagnosis not present

## 2016-08-24 DIAGNOSIS — F17290 Nicotine dependence, other tobacco product, uncomplicated: Secondary | ICD-10-CM | POA: Insufficient documentation

## 2016-08-24 DIAGNOSIS — Z79899 Other long term (current) drug therapy: Secondary | ICD-10-CM | POA: Insufficient documentation

## 2016-08-24 DIAGNOSIS — F17210 Nicotine dependence, cigarettes, uncomplicated: Secondary | ICD-10-CM | POA: Diagnosis not present

## 2016-08-24 DIAGNOSIS — E039 Hypothyroidism, unspecified: Secondary | ICD-10-CM | POA: Diagnosis not present

## 2016-08-24 DIAGNOSIS — Z7982 Long term (current) use of aspirin: Secondary | ICD-10-CM | POA: Diagnosis not present

## 2016-08-24 DIAGNOSIS — K5792 Diverticulitis of intestine, part unspecified, without perforation or abscess without bleeding: Secondary | ICD-10-CM

## 2016-08-24 DIAGNOSIS — I10 Essential (primary) hypertension: Secondary | ICD-10-CM | POA: Insufficient documentation

## 2016-08-24 DIAGNOSIS — R1032 Left lower quadrant pain: Secondary | ICD-10-CM | POA: Diagnosis present

## 2016-08-24 LAB — CBC WITH DIFFERENTIAL/PLATELET
BASOS ABS: 0 10*3/uL (ref 0.0–0.1)
BASOS PCT: 0 %
EOS ABS: 0.1 10*3/uL (ref 0.0–0.7)
Eosinophils Relative: 1 %
HCT: 39.8 % (ref 36.0–46.0)
Hemoglobin: 13.7 g/dL (ref 12.0–15.0)
LYMPHS ABS: 2.1 10*3/uL (ref 0.7–4.0)
Lymphocytes Relative: 15 %
MCH: 32.8 pg (ref 26.0–34.0)
MCHC: 34.4 g/dL (ref 30.0–36.0)
MCV: 95.2 fL (ref 78.0–100.0)
MONO ABS: 1.3 10*3/uL — AB (ref 0.1–1.0)
MONOS PCT: 9 %
NEUTROS ABS: 10.8 10*3/uL — AB (ref 1.7–7.7)
NEUTROS PCT: 75 %
Platelets: 274 10*3/uL (ref 150–400)
RBC: 4.18 MIL/uL (ref 3.87–5.11)
RDW: 14.8 % (ref 11.5–15.5)
WBC: 14.3 10*3/uL — ABNORMAL HIGH (ref 4.0–10.5)

## 2016-08-24 LAB — URINALYSIS, ROUTINE W REFLEX MICROSCOPIC
BILIRUBIN URINE: NEGATIVE
Bacteria, UA: NONE SEEN
GLUCOSE, UA: NEGATIVE mg/dL
KETONES UR: NEGATIVE mg/dL
LEUKOCYTES UA: NEGATIVE
Nitrite: NEGATIVE
Protein, ur: NEGATIVE mg/dL
SPECIFIC GRAVITY, URINE: 1.017 (ref 1.005–1.030)
pH: 7 (ref 5.0–8.0)

## 2016-08-24 LAB — BASIC METABOLIC PANEL
ANION GAP: 8 (ref 5–15)
BUN: 21 mg/dL — ABNORMAL HIGH (ref 6–20)
CO2: 30 mmol/L (ref 22–32)
Calcium: 9.4 mg/dL (ref 8.9–10.3)
Chloride: 101 mmol/L (ref 101–111)
Creatinine, Ser: 1.12 mg/dL — ABNORMAL HIGH (ref 0.44–1.00)
GFR calc non Af Amer: 50 mL/min — ABNORMAL LOW (ref >60)
GFR, EST AFRICAN AMERICAN: 58 mL/min — AB (ref >60)
Glucose, Bld: 87 mg/dL (ref 65–99)
POTASSIUM: 3.9 mmol/L (ref 3.5–5.1)
SODIUM: 139 mmol/L (ref 135–145)

## 2016-08-24 MED ORDER — HYDROCODONE-ACETAMINOPHEN 5-325 MG PO TABS: 1.0000 | ORAL_TABLET | Freq: Four times a day (QID) | ORAL | 0 refills | Status: DC | PRN

## 2016-08-24 MED ORDER — ACETAMINOPHEN 500 MG PO TABS
1000.0000 mg | ORAL_TABLET | Freq: Once | ORAL | Status: AC
  Administered 2016-08-24: 1000 mg via ORAL
  Filled 2016-08-24: qty 2

## 2016-08-24 MED ORDER — ONDANSETRON HCL 4 MG PO TABS: 4.0000 mg | ORAL_TABLET | Freq: Three times a day (TID) | ORAL | 0 refills | Status: DC | PRN

## 2016-08-24 MED ORDER — METRONIDAZOLE IN NACL 5-0.79 MG/ML-% IV SOLN
500.0000 mg | Freq: Once | INTRAVENOUS | Status: AC
  Administered 2016-08-24: 500 mg via INTRAVENOUS
  Filled 2016-08-24: qty 100

## 2016-08-24 MED ORDER — CIPROFLOXACIN IN D5W 400 MG/200ML IV SOLN
400.0000 mg | Freq: Once | INTRAVENOUS | Status: AC
  Administered 2016-08-24: 400 mg via INTRAVENOUS
  Filled 2016-08-24: qty 200

## 2016-08-24 MED ORDER — ONDANSETRON HCL 4 MG/2ML IJ SOLN
4.0000 mg | Freq: Once | INTRAMUSCULAR | Status: DC
  Filled 2016-08-24: qty 2

## 2016-08-24 MED ORDER — SODIUM CHLORIDE 0.9 % IV BOLUS (SEPSIS)
500.0000 mL | Freq: Once | INTRAVENOUS | Status: AC
  Administered 2016-08-24: 500 mL via INTRAVENOUS

## 2016-08-24 MED ORDER — METRONIDAZOLE 500 MG PO TABS: 500.0000 mg | ORAL_TABLET | Freq: Two times a day (BID) | ORAL | 0 refills | Status: AC

## 2016-08-24 MED ORDER — IOPAMIDOL (ISOVUE-300) INJECTION 61%
100.0000 mL | Freq: Once | INTRAVENOUS | Status: AC | PRN
  Administered 2016-08-24: 100 mL via INTRAVENOUS

## 2016-08-24 MED ORDER — CIPROFLOXACIN HCL 500 MG PO TABS: 500.0000 mg | ORAL_TABLET | Freq: Two times a day (BID) | ORAL | 0 refills | Status: DC

## 2016-08-24 NOTE — ED Provider Notes (Signed)
[facility] DEPT Provider Note   CSN: [id number] Arrival date & time: 08/24/16  1121     History   Chief Complaint Chief Complaint  Patient presents with  . Abdominal Pain    HPI Emily Valentine is a 66 y.o. female.  HPI   Patient presents with left lower abdominal pain. Began yesterday somewhat quickly in the evening. Has some mild nausea. Normal bowel movement. States she has had some chills. No dysuria. States she has felt as if she had to throw up but did not. No vaginal bleeding or discharge. Previous hysterectomy. She has not had pains like this before. Pain is worse with certain movements. Past Medical History:  Diagnosis Date  . History of echocardiogram    Echo (8/15): Vigorous LV function, EF 65-70%, normal wall motion, grade 1 diastolic dysfunction, mild to moderate TR, PASP 31 mm Hg  . Hx of cardiovascular stress test    ETT-Myoview (8/15): no ischemia, EF 56%, normal study  . Hypertension   . Hypothyroid   . Pernicious anemia   . S/P colonoscopy 2001, 2004   2001: hyperplastic polyp, 2004: normal, inflammatory polyp    Patient Active Problem List   Diagnosis Date Noted  . Mass of right breast 01/17/2015  . Perimenopausal vasomotor symptoms 09/05/2014  . 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 Palpitations 10/04/2013  . Right bundle branch block 08/28/2011  . Tobacco abuse 08/28/2011  . Essential hypertension 08/28/2011  . Encounter for screening colonoscopy 10/14/2010    Past Surgical History:  Procedure Laterality Date  . ABDOMINAL HYSTERECTOMY    . BACK SURGERY    . BREAST LUMPECTOMY WITH RADIOACTIVE SEED LOCALIZATION Right 02/21/2015   Procedure: RIGHT BREAST LUMPECTOMY WITH RADIOACTIVE SEED LOCALIZATION;  Surgeon: Rolm Bookbinder, MD;  Location: [facility];  Service: General;  Laterality: Right;  . COLONOSCOPY  11/10/2010   Procedure: COLONOSCOPY;  Surgeon: Daneil Dolin, MD;  [facility];  Service: Endoscopy;  Laterality: N/A;   . THYROIDECTOMY      OB History    No data available       Home Medications    Prior to Admission medications   Medication Sig Start Date End Date Taking? Authorizing Provider  aspirin EC 81 MG tablet Take 1 tablet (81 mg total) by mouth daily. 10/04/13  Yes Weaver, Scott T, PA-C  cholecalciferol (VITAMIN D) 1000 units tablet Take 1,000 Units by mouth daily.   Yes [provider]  folic acid (FOLVITE) 1 MG tablet Take 1 mg by mouth daily.   Yes [provider]  levothyroxine (SYNTHROID, LEVOTHROID) 100 MCG tablet Take 100 mcg by mouth daily.     Yes [provider]  losartan-hydrochlorothiazide (HYZAAR) 100-25 MG tablet TAKE ONE TABLET BY MOUTH ONCE DAILY. 06/16/16  Yes Josue Hector, MD  magnesium hydroxide (MILK OF MAGNESIA) 400 MG/5ML suspension Take 30 mLs by mouth daily as needed (constipation).    Yes [provider]  metoprolol succinate (TOPROL-XL) 25 MG 24 hr tablet TAKE (1) TABLET BY MOUTH DAILY. 09/18/15  Yes Josue Hector, MD  Omega-3 Fatty Acids (FISH OIL) 1000 MG CAPS Take 1 capsule by mouth daily.   Yes [provider]  Cyanocobalamin (VITAMIN B-12 IJ) Inject as directed every 30 (thirty) days.      [provider]  Methotrexate, Anti-Rheumatic, (METHOTREXATE, PF, [place]) Inject 10 mg into the skin once a week.     [provider]  nitroGLYCERIN (NITROSTAT) 0.4 MG SL tablet  Place 1 tablet (0.4 mg total) under the tongue every 5 (five) minutes as needed for chest pain. 09/18/15   Josue Hector, MD    Family History Family History  Problem Relation Age of Onset  . Stomach cancer Mother        deceased  . Anemia Mother   . Cancer Mother   . Hypertension Mother   . Thyroid disease Mother   . Colon cancer Father        diagnosed age 79, died at age 72  . Anemia Father   . Cancer Father   . Hypertension Father   . Anemia Sister   . Cancer Brother   . Diabetes Sister   . Hypertension Sister   .  Hypertension Brother   . Thyroid disease Brother   . Thyroid disease Sister     Social History Social History  Substance Use Topics  . Smoking status: Current Every Day Smoker    Packs/day: 1.00    Years: 20.00    Types: Cigarettes  . Smokeless tobacco: Current User  . Alcohol use No     Allergies   Codeine and Oxycontin [oxycodone]   Review of Systems Review of Systems  Constitutional: Positive for appetite change.  HENT: Negative for congestion.   Respiratory: Negative for shortness of breath.   Cardiovascular: Negative for chest pain.  Gastrointestinal: Positive for abdominal pain and nausea.  Genitourinary: Negative for flank pain and hematuria.  Musculoskeletal: Negative for back pain.  Skin: Negative for rash.  Neurological: Negative for weakness.  Hematological: Negative for adenopathy.  Psychiatric/Behavioral: Negative for confusion.     Physical Exam Updated Vital Signs BP (!) 142/89   Pulse 93   Temp 98.5 F (36.9 C) (Oral)   Resp 17   Ht 5\' 2"  (1.575 m)   Wt 62.6 kg (138 lb)   SpO2 100%   BMI 25.24 kg/m   Physical Exam  Constitutional: She appears well-developed.  HENT:  Head: Atraumatic.  Neck: Neck supple.  Cardiovascular: Normal rate.   Pulmonary/Chest: Effort normal.  Abdominal: Soft. There is tenderness.  Moderate left lower quadrant tenderness without mass. No hernias palpated.  Musculoskeletal: She exhibits no tenderness.  Neurological: She is alert.  Skin: Skin is warm. Capillary refill takes less than 2 seconds.  Psychiatric: She has a normal mood and affect.     ED Treatments / Results  Labs (all labs ordered are listed, but only abnormal results are displayed) Labs Reviewed  CBC WITH DIFFERENTIAL/PLATELET - Abnormal; Notable for the following:       Result Value   WBC 14.3 (*)    Neutro Abs 10.8 (*)    Monocytes Absolute 1.3 (*)    All other components within normal limits  BASIC METABOLIC PANEL - Abnormal; Notable for  the following:    BUN 21 (*)    Creatinine, Ser 1.12 (*)    GFR calc non Af Amer 50 (*)    GFR calc Af Amer 58 (*)    All other components within normal limits  URINALYSIS, ROUTINE W REFLEX MICROSCOPIC - Abnormal; Notable for the following:    Hgb urine dipstick SMALL (*)    Squamous Epithelial / LPF 0-5 (*)    All other components within normal limits    EKG  EKG Interpretation None       Radiology No results found.  Procedures Procedures (including critical care time)  Medications Ordered in ED Medications  sodium chloride 0.9 % bolus 500  mL (500 mLs Intravenous New Bag/Given 08/24/16 1446)  ondansetron (ZOFRAN) injection 4 mg (4 mg Intravenous Not Given 08/24/16 1447)     Initial Impression / Assessment and Plan / ED Course  I have reviewed the triage vital signs and the nursing notes.  Pertinent labs & imaging results that were available during my care of the patient were reviewed by me and considered in my medical decision making (see chart for details).     Patient with moderate left lower quadrant abdominal pain. Began swelling acutely. Labs overall reassuring but white count mildly elevated at 14. CT scan pending. Care turned over to Dr. Dayna Barker  Final Clinical Impressions(s) / ED Diagnoses   Final diagnoses:  Left lower quadrant pain    New Prescriptions New Prescriptions   No medications on file     Davonna Belling, MD 08/24/16 1507

## 2016-08-24 NOTE — ED Provider Notes (Signed)
4:30 PM Assumed care from Dr. Alvino Chapel, please see their note for full history, physical and decision making until this point. In brief this is a 66 y.o. year old female who presented to the ED tonight with Abdominal Pain     Found to have diverticulitis. Started on cipro/flagyl. Non-peritonitic abdomen. Patient doesn't want anything more than tylenol at this time for pain. Nausea is ok if not doing too much moving, didn't want anti-emetic offered here.  Plan for dc on abx with rx for vicodin/zofran if needed for worsening symptoms at home.  Close pcp follow up for improving symptoms.   Discharge instructions, including strict return precautions for new or worsening symptoms, given. Patient and/or family verbalized understanding and agreement with the plan as described.   Labs, studies and imaging reviewed by myself and considered in medical decision making if ordered. Imaging interpreted by radiology.  Labs Reviewed  CBC WITH DIFFERENTIAL/PLATELET - Abnormal; Notable for the following:       Result Value   WBC 14.3 (*)    Neutro Abs 10.8 (*)    Monocytes Absolute 1.3 (*)    All other components within normal limits  BASIC METABOLIC PANEL - Abnormal; Notable for the following:    BUN 21 (*)    Creatinine, Ser 1.12 (*)    GFR calc non Af Amer 50 (*)    GFR calc Af Amer 58 (*)    All other components within normal limits  URINALYSIS, ROUTINE W REFLEX MICROSCOPIC - Abnormal; Notable for the following:    Hgb urine dipstick SMALL (*)    Squamous Epithelial / LPF 0-5 (*)    All other components within normal limits    CT Abdomen Pelvis W Contrast  Final Result      No Follow-up on file.    Merrily Pew, MD 08/24/16 [phone]

## 2016-08-24 NOTE — ED Triage Notes (Signed)
Pt c/o sudden LLQ pain yesterday. Denies gu sx. Had normal bm today. Nad. Denies n/v/d

## 2016-09-01 ENCOUNTER — Encounter: Payer: Self-pay | Admitting: Internal Medicine

## 2016-09-01 ENCOUNTER — Other Ambulatory Visit: Payer: Self-pay | Admitting: Registered Nurse

## 2016-09-02 ENCOUNTER — Other Ambulatory Visit: Payer: Self-pay | Admitting: Registered Nurse

## 2016-09-02 DIAGNOSIS — Z9889 Other specified postprocedural states: Secondary | ICD-10-CM

## 2016-09-08 ENCOUNTER — Other Ambulatory Visit: Payer: Self-pay | Admitting: Family Medicine

## 2016-09-08 DIAGNOSIS — D249 Benign neoplasm of unspecified breast: Secondary | ICD-10-CM

## 2016-09-15 ENCOUNTER — Telehealth: Payer: Self-pay

## 2016-09-15 ENCOUNTER — Other Ambulatory Visit: Payer: Self-pay

## 2016-09-15 ENCOUNTER — Encounter: Payer: Self-pay | Admitting: Internal Medicine

## 2016-09-15 ENCOUNTER — Ambulatory Visit (INDEPENDENT_AMBULATORY_CARE_PROVIDER_SITE_OTHER): Payer: Medicare Other | Admitting: Internal Medicine

## 2016-09-15 VITALS — BP 131/84 | HR 75 | Temp 97.2°F | Ht 62.0 in | Wt 124.4 lb

## 2016-09-15 DIAGNOSIS — Z860100 Personal history of colon polyps, unspecified: Secondary | ICD-10-CM

## 2016-09-15 DIAGNOSIS — R935 Abnormal findings on diagnostic imaging of other abdominal regions, including retroperitoneum: Secondary | ICD-10-CM

## 2016-09-15 DIAGNOSIS — K5732 Diverticulitis of large intestine without perforation or abscess without bleeding: Secondary | ICD-10-CM

## 2016-09-15 MED ORDER — PEG 3350-KCL-NA BICARB-NACL 420 G PO SOLR: 4000.0000 mL | ORAL | 0 refills | Status: DC

## 2016-09-15 NOTE — Telephone Encounter (Signed)
Called pt to schedule Colonoscopy with RMR, she asked for me to hold spot 09/28/16 at 2:15pm and she will call me back.

## 2016-09-15 NOTE — Telephone Encounter (Signed)
Pt called office back. She can have TCS done 09/28/16 at 2:15. She is aware to start Linzess 134mcg daily 3 days before procedure. Prep rx sent to pharmacy. Instructions mailed to pt. Orders entered.

## 2016-09-15 NOTE — Progress Notes (Signed)
Primary Care Physician:  Sharilyn Sites, MD Primary Gastroenterologist:  Dr. Gala Romney  Pre-Procedure History & Physical: HPI:  Emily Valentine is a 66 y.o. female here for follow-up after being diagnosed with diverticulitis about 4 weeks ago. Went to the ED for left lower quadrant abdominal pain. CT demonstrated inflammatory changes sigmoid colon. Given Cipro and Flagyl. The symptoms have resolved. She is chronically constipated. History of colonic adenoma removed by me 2012. Father with colon cancer but at an advanced age. She's not had any rectal bleeding. She's needs a colonoscopy at this time given her personal history of colonic polyps and abnormal: Imaging on CT.  Past Medical History:  Diagnosis Date  . History of echocardiogram    Echo (8/15): Vigorous LV function, EF 65-70%, normal wall motion, grade 1 diastolic dysfunction, mild to moderate TR, PASP 31 mm Hg  . Hx of cardiovascular stress test    ETT-Myoview (8/15): no ischemia, EF 56%, normal study  . Hypertension   . Hypothyroid   . Pernicious anemia   . S/P colonoscopy 2001, 2004   2001: hyperplastic polyp, 2004: normal, inflammatory polyp    Past Surgical History:  Procedure Laterality Date  . ABDOMINAL HYSTERECTOMY    . BACK SURGERY    . BREAST LUMPECTOMY WITH RADIOACTIVE SEED LOCALIZATION Right 02/21/2015   Procedure: RIGHT BREAST LUMPECTOMY WITH RADIOACTIVE SEED LOCALIZATION;  Surgeon: Rolm Bookbinder, MD;  Location: [place];  Service: General;  Laterality: Right;  . COLONOSCOPY  11/10/2010   Procedure: COLONOSCOPY;  Surgeon: Daneil Dolin, MD;  Location: [facility];  Service: Endoscopy;  Laterality: N/A;  . THYROIDECTOMY      Prior to Admission medications   Medication Sig Start Date End Date Taking? Authorizing Provider  aspirin EC 81 MG tablet Take 1 tablet (81 mg total) by mouth daily. 10/04/13  Yes Weaver, Scott T, PA-C  cholecalciferol (VITAMIN D) 1000 units tablet Take 1,000 Units by  mouth daily.   Yes [provider]  Cyanocobalamin (VITAMIN B-12 IJ) Inject as directed every 30 (thirty) days.     Yes [provider]  folic acid (FOLVITE) 1 MG tablet Take 1 mg by mouth daily.   Yes [provider]  levothyroxine (SYNTHROID, LEVOTHROID) 100 MCG tablet Take 100 mcg by mouth daily.     Yes [provider]  losartan-hydrochlorothiazide (HYZAAR) 100-25 MG tablet TAKE ONE TABLET BY MOUTH ONCE DAILY. 06/16/16  Yes Josue Hector, MD  magnesium hydroxide (MILK OF MAGNESIA) 400 MG/5ML suspension Take 30 mLs by mouth daily as needed (constipation).    Yes [provider]  Methotrexate, Anti-Rheumatic, (METHOTREXATE, PF, [place]) Inject 10 mg into the skin once a week.    Yes [provider]  metoprolol succinate (TOPROL-XL) 25 MG 24 hr tablet TAKE (1) TABLET BY MOUTH DAILY. 09/18/15  Yes Josue Hector, MD  Multiple Vitamins-Minerals (MULTIVITAMIN WITH MINERALS) tablet Take 1 tablet by mouth daily.   Yes [provider]  nitroGLYCERIN (NITROSTAT) 0.4 MG SL tablet Place 1 tablet (0.4 mg total) under the tongue every 5 (five) minutes as needed for chest pain. 09/18/15  Yes Josue Hector, MD  Omega-3 Fatty Acids (FISH OIL) 1000 MG CAPS Take 1 capsule by mouth daily.   Yes [provider]    Allergies as of 09/15/2016 - Review Complete 09/15/2016  Allergen Reaction Noted  . Codeine Nausea Only 08/24/2016  . Oxycontin [oxycodone] Nausea And Vomiting 09/01/2011    Family History  Problem Relation Age of Onset  . Stomach cancer Mother        deceased  . Anemia Mother   . Cancer Mother   . Hypertension Mother   . Thyroid disease Mother   . Colon cancer Father        diagnosed age 2, died at age 67  . Anemia Father   . Cancer Father   . Hypertension Father   . Anemia Sister   . Cancer Brother   . Diabetes Sister   . Hypertension Sister   . Hypertension Brother   . Thyroid disease Brother   . Thyroid disease  Sister     Social History   Social History  . Marital status: Married    Spouse name: N/A  . Number of children: N/A  . Years of education: N/A   Occupational History  . Not on file.   Social History Main Topics  . Smoking status: Current Every Day Smoker    Packs/day: 1.00    Years: 20.00    Types: Cigarettes  . Smokeless tobacco: Current User  . Alcohol use No  . Drug use: No  . Sexual activity: Yes    Birth control/ protection: Surgical   Other Topics Concern  . Not on file   Social History Narrative  . No narrative on file    Review of Systems: See HPI, otherwise negative ROS  Physical Exam: BP 131/84   Pulse 75   Temp (!) 97.2 F (36.2 C) (Oral)   Ht 5\' 2"  (1.575 m)   Wt 124 lb 6.4 oz (56.4 kg)   BMI 22.75 kg/m  General:   Alert,  , pleasant and cooperative in NAD Neck:  Supple; no masses or thyromegaly. No significant cervical adenopathy. Lungs:  Clear throughout to auscultation.   No wheezes, crackles, or rhonchi. No acute distress. Heart:  Regular rate and rhythm; no murmurs, clicks, rubs,  or gallops. Abdomen: Non-distended, normal bowel sounds.  Soft and nontender without appreciable mass or hepatosplenomegaly.  Pulses:  Normal pulses noted. Extremities:  Without clubbing or edema.  Impression:  Pleasant 66 year old lady with a recent left lower quadrant abdominal pain. Abnormal sigmoid colon consistent with diverticulitis Clinically, she is recovered from this acute illness. Family history of colon cancer in her father at advanced age. Personal history of colonic adenoma. Chronic constipation.  Recommendations:   I have offered the patient a diagnostic colonoscopy in the near future.  The risks, benefits, limitations, alternatives and imponderables have been reviewed with the patient. Questions have been answered. All parties are agreeable.   Schedule a diagnostic colonoscopy (conscious sedation)  Split prep  Linzess 145 daily - 3 days prior to  procedure   To manage constipation chronically:  Begin Benefiber 1 tablespoon twice daily   Take a capful of Miralax at night on any given day without a bowel movement  Further recommendations         Notice: This dictation was prepared with Dragon dictation along with smaller phrase technology. Any transcriptional errors that result from this process are unintentional and may not be corrected upon review.

## 2016-09-15 NOTE — Patient Instructions (Signed)
Schedule a diagnostic colonoscopy (conscious sedation)  Split prep  Linzess 145 daily - 3 days prior to procedure  Begin Benefiber 1 tablespoon twice daily   Take a capful of Miralax at night on any given day without a bowel movement  Further recommendations

## 2016-09-15 NOTE — Telephone Encounter (Signed)
PA info for Colonoscopy submitted via [facility] website. No PA needed. Decision ID# [id number].

## 2016-09-25 ENCOUNTER — Telehealth: Payer: Self-pay

## 2016-09-25 NOTE — Telephone Encounter (Signed)
Called and informed pt of time change for TCS 09/28/16. Procedure time will be 1:45pm, pt to arrive at 12:45pm. Instructed to start drinking prep that morning at 8:45am and complete by 10:45am.

## 2016-09-28 ENCOUNTER — Encounter (HOSPITAL_COMMUNITY)
Admission: RE | Disposition: A | Discharge status: Home / Self Care | Payer: Self-pay | Source: Ambulatory Visit | Attending: Internal Medicine

## 2016-09-28 ENCOUNTER — Ambulatory Visit (HOSPITAL_COMMUNITY)
Admission: RE | Admit: 2016-09-28 | Discharge: 2016-09-28 | Disposition: A | Discharge status: Home / Self Care | Payer: Medicare Other | Source: Ambulatory Visit | Attending: Internal Medicine | Admitting: Internal Medicine

## 2016-09-28 ENCOUNTER — Encounter (HOSPITAL_COMMUNITY): Payer: Self-pay | Admitting: *Deleted

## 2016-09-28 DIAGNOSIS — F17210 Nicotine dependence, cigarettes, uncomplicated: Secondary | ICD-10-CM | POA: Diagnosis not present

## 2016-09-28 DIAGNOSIS — K5909 Other constipation: Secondary | ICD-10-CM | POA: Diagnosis not present

## 2016-09-28 DIAGNOSIS — K5730 Diverticulosis of large intestine without perforation or abscess without bleeding: Secondary | ICD-10-CM | POA: Diagnosis not present

## 2016-09-28 DIAGNOSIS — E039 Hypothyroidism, unspecified: Secondary | ICD-10-CM | POA: Insufficient documentation

## 2016-09-28 DIAGNOSIS — R933 Abnormal findings on diagnostic imaging of other parts of digestive tract: Secondary | ICD-10-CM

## 2016-09-28 DIAGNOSIS — Z800 Family history of malignant neoplasm of digestive organs: Secondary | ICD-10-CM | POA: Diagnosis not present

## 2016-09-28 DIAGNOSIS — R935 Abnormal findings on diagnostic imaging of other abdominal regions, including retroperitoneum: Secondary | ICD-10-CM

## 2016-09-28 DIAGNOSIS — I10 Essential (primary) hypertension: Secondary | ICD-10-CM | POA: Diagnosis not present

## 2016-09-28 DIAGNOSIS — Z86010 Personal history of colonic polyps: Secondary | ICD-10-CM

## 2016-09-28 DIAGNOSIS — K5732 Diverticulitis of large intestine without perforation or abscess without bleeding: Secondary | ICD-10-CM

## 2016-09-28 DIAGNOSIS — Z7982 Long term (current) use of aspirin: Secondary | ICD-10-CM | POA: Diagnosis not present

## 2016-09-28 HISTORY — PX: COLONOSCOPY: SHX5424

## 2016-09-28 SURGERY — COLONOSCOPY
Anesthesia: Moderate Sedation

## 2016-09-28 MED ORDER — SODIUM CHLORIDE 0.9 % IV SOLN
INTRAVENOUS | Status: DC
  Administered 2016-09-28: 13:00:00 via INTRAVENOUS

## 2016-09-28 MED ORDER — ONDANSETRON HCL 4 MG/2ML IJ SOLN
INTRAMUSCULAR | Status: DC | PRN
  Administered 2016-09-28: 4 mg via INTRAVENOUS

## 2016-09-28 MED ORDER — ONDANSETRON HCL 4 MG/2ML IJ SOLN
INTRAMUSCULAR | Status: AC
  Filled 2016-09-28: qty 2

## 2016-09-28 MED ORDER — MEPERIDINE HCL 100 MG/ML IJ SOLN
INTRAMUSCULAR | Status: DC | PRN
  Administered 2016-09-28: 25 mg via INTRAVENOUS
  Administered 2016-09-28: 50 mg via INTRAVENOUS

## 2016-09-28 MED ORDER — MIDAZOLAM HCL 5 MG/5ML IJ SOLN
INTRAMUSCULAR | Status: AC
  Filled 2016-09-28: qty 10

## 2016-09-28 MED ORDER — MIDAZOLAM HCL 5 MG/5ML IJ SOLN
INTRAMUSCULAR | Status: DC | PRN
  Administered 2016-09-28: 1 mg via INTRAVENOUS
  Administered 2016-09-28: 2 mg via INTRAVENOUS

## 2016-09-28 MED ORDER — STERILE WATER FOR IRRIGATION IR SOLN
Status: DC | PRN
  Administered 2016-09-28: 14:00:00

## 2016-09-28 MED ORDER — MEPERIDINE HCL 100 MG/ML IJ SOLN
INTRAMUSCULAR | Status: AC
  Filled 2016-09-28: qty 2

## 2016-09-28 NOTE — Op Note (Signed)
[facility] Patient Name: Emily Valentine Procedure Date: 09/28/2016 1:22 PM MRN: [id number] Date of Birth: 1950-11-07 Attending MD: Norvel Richards , MD CSN: [id number] Age: 66 Admit Type: Outpatient Procedure:                Colonoscopy Indications:              Abnormal CT of the GI tract Providers:                Norvel Richards, MD, Otis Peak B. Gwenlyn Perking RN, RN,                            Randa Spike, Technician Referring MD:              Medicines:                Midazolam 3 mg IV, Meperidine 75 mg IV, Ondansetron                            4 mg IV Complications:            No immediate complications. Estimated Blood Loss:     Estimated blood loss: none. Procedure:                Pre-Anesthesia Assessment:                           - Prior to the procedure, a History and Physical                            was performed, and patient medications and                            allergies were reviewed. The patient's tolerance of                            previous anesthesia was also reviewed. The risks                            and benefits of the procedure and the sedation                            options and risks were discussed with the patient.                            All questions were answered, and informed consent                            was obtained. Prior Anticoagulants: The patient has                            taken no previous anticoagulant or antiplatelet                            agents. ASA Grade Assessment: II - A patient with  mild systemic disease. After reviewing the risks                            and benefits, the patient was deemed in                            satisfactory condition to undergo the procedure.                           After obtaining informed consent, the colonoscope                            was passed under direct vision. Throughout the                            procedure, the patient's  blood pressure, pulse, and                            oxygen saturations were monitored continuously. The                            [id number]) was introduced through the anus                            and advanced to the the cecum, identified by                            appendiceal orifice and ileocecal valve. The                            [id number] ([id number]) scope was introduced through                            the and advanced to the. The ileocecal valve,                            appendiceal orifice, and rectum were photographed.                            The entire colon was well visualized. The patient                            tolerated the procedure well. The quality of the                            bowel preparation was adequate. The colonoscopy was                            performed without difficulty. The patient tolerated                            the procedure well. The quality of the bowel  preparation was adequate. Scope In: 1:55:51 PM Scope Out: 2:15:58 PM Scope Withdrawal Time: 0 hours 7 minutes 21 seconds  Total Procedure Duration: 0 hours 20 minutes 7 seconds  Findings:      The perianal and digital rectal examinations were normal.      Scattered small and large-mouthed diverticula were found in the sigmoid       colon and descending colon. focal segment of somewhat edematousmucosa       proximal sigmoid approximately 10 cm in length. This appeared to be       benign edematous mucosa. No evidence of infiltrating or neoplastic       process anywhere.      The exam was otherwise without abnormality on direct and retroflexion       views. Impression:               - Diverticulosis in the sigmoid colon and in the                            descending colon. Changes consistent with recent                            diverticulitis.                           - The examination was otherwise normal on direct                             and retroflexion views.                           - No specimens collected. Moderate Sedation:      Moderate (conscious) sedation was administered by the endoscopy nurse       and supervised by the endoscopist. The following parameters were       monitored: oxygen saturation, heart rate, blood pressure, respiratory       rate, EKG, adequacy of pulmonary ventilation, and response to care.       Total physician intraservice time was 23 minutes. Recommendation:           - Patient has a contact number available for                            emergencies. The signs and symptoms of potential                            delayed complications were discussed with the                            patient. Return to normal activities tomorrow.                            Written discharge instructions were provided to the                            patient.                           - Resume  previous diet.                           - Continue present medications.                           - Repeat colonoscopy in 5 years for surveillance.                           - Return to GI clinic PRN. Procedure Code(s):        --- Professional ---                           [phone], Colonoscopy, flexible; diagnostic, including                            collection of specimen(s) by brushing or washing,                            when performed (separate procedure)                           99152, Moderate sedation services provided by the                            same physician or other qualified health care                            professional performing the diagnostic or                            therapeutic service that the sedation supports,                            requiring the presence of an independent trained                            observer to assist in the monitoring of the                            patient's level of consciousness and physiological                            status;  initial 15 minutes of intraservice time,                            patient age 15 years or older                           [phone], Moderate sedation services; each additional                            15 minutes intraservice time Diagnosis Code(s):        --- Professional ---  K57.30, Diverticulosis of large intestine without                            perforation or abscess without bleeding                           R93.3, Abnormal findings on diagnostic imaging of                            other parts of digestive tract CPT copyright 2016 American [employer]. All rights reserved. The codes documented in this report are preliminary and upon coder review may  be revised to meet current compliance requirements. Cristopher Estimable. Travarius Lange, MD Norvel Richards, MD 09/28/2016 2:30:24 PM This report has been signed electronically. Number of Addenda: 0

## 2016-09-28 NOTE — Discharge Instructions (Addendum)
Diverticulosis Diverticulosis is a condition that develops when small pouches (diverticula) form in the wall of the large intestine (colon). The colon is where water is absorbed and stool is formed. The pouches form when the inside layer of the colon pushes through weak spots in the outer layers of the colon. You may have a few pouches or many of them. What are the causes? The cause of this condition is not known. What increases the risk? The following factors may make you more likely to develop this condition:  Being older than age 27. Your risk for this condition increases with age. Diverticulosis is rare among people younger than age 36. By age 28, many people have it.  Eating a low-fiber diet.  Having frequent constipation.  Being overweight.  Not getting enough exercise.  Smoking.  Taking over-the-counter pain medicines, like aspirin and ibuprofen.  Having a family history of diverticulosis.  What are the signs or symptoms? In most people, there are no symptoms of this condition. If you do have symptoms, they may include:  Bloating.  Cramps in the abdomen.  Constipation or diarrhea.  Pain in the lower left side of the abdomen.  How is this diagnosed? This condition is most often diagnosed during an exam for other colon problems. Because diverticulosis usually has no symptoms, it often cannot be diagnosed independently. This condition may be diagnosed by:  Using a flexible scope to examine the colon (colonoscopy).  Taking an X-ray of the colon after dye has been put into the colon (barium enema).  Doing a CT scan.  How is this treated? You may not need treatment for this condition if you have never developed an infection related to diverticulosis. If you have had an infection before, treatment may include:  Eating a high-fiber diet. This may include eating more fruits, vegetables, and grains.  Taking a fiber supplement.  Taking a live bacteria supplement  (probiotic).  Taking medicine to relax your colon.  Taking antibiotic medicines.  Follow these instructions at home:  Drink 6-8 glasses of water or more each day to prevent constipation.  Try not to strain when you have a bowel movement.  If you have had an infection before: ? Eat more fiber as directed by your health care provider or your diet and nutrition specialist (dietitian). ? Take a fiber supplement or probiotic, if your health care provider approves.  Take over-the-counter and prescription medicines only as told by your health care provider.  If you were prescribed an antibiotic, take it as told by your health care provider. Do not stop taking the antibiotic even if you start to feel better.  Keep all follow-up visits as told by your health care provider. This is important. Contact a health care provider if:  You have pain in your abdomen.  You have bloating.  You have cramps.  You have not had a bowel movement in 3 days. Get help right away if:  Your pain gets worse.  Your bloating becomes very bad.  You have a fever or chills, and your symptoms suddenly get worse.  You vomit.  You have bowel movements that are bloody or black.  You have bleeding from your rectum. Summary  Diverticulosis is a condition that develops when small pouches (diverticula) form in the wall of the large intestine (colon).  You may have a few pouches or many of them.  This condition is most often diagnosed during an exam for other colon problems.  If you have had an  infection related to diverticulosis, treatment may include increasing the fiber in your diet, taking supplements, or taking medicines. This information is not intended to replace advice given to you by your health care provider. Make sure you discuss any questions you have with your health care provider. Document Released: 11/28/2003 Document Revised: 01/20/2016 Document Reviewed: 01/20/2016 Elsevier Interactive  Patient Education  2017 [place].  Colonoscopy Discharge Instructions  Read the instructions outlined below and refer to this sheet in the next few weeks. These discharge instructions provide you with general information on caring for yourself after you leave the hospital. Your doctor may also give you specific instructions. While your treatment has been planned according to the most current medical practices available, unavoidable complications occasionally occur. If you have any problems or questions after discharge, call Dr. Gala Romney at [phone]. ACTIVITY  You may resume your regular activity, but move at a slower pace for the next 24 hours.   Take frequent rest periods for the next 24 hours.   Walking will help get rid of the air and reduce the bloated feeling in your belly (abdomen).   No driving for 24 hours (because of the medicine (anesthesia) used during the test).    Do not sign any important legal documents or operate any machinery for 24 hours (because of the anesthesia used during the test).  NUTRITION  Drink plenty of fluids.   You may resume your normal diet as instructed by your doctor.   Begin with a light meal and progress to your normal diet. Heavy or fried foods are harder to digest and may make you feel sick to your stomach (nauseated).   Avoid alcoholic beverages for 24 hours or as instructed.  MEDICATIONS  You may resume your normal medications unless your doctor tells you otherwise.  WHAT YOU CAN EXPECT TODAY  Some feelings of bloating in the abdomen.   Passage of more gas than usual.   Spotting of blood in your stool or on the toilet paper.  IF YOU HAD POLYPS REMOVED DURING THE COLONOSCOPY:  No aspirin products for 7 days or as instructed.   No alcohol for 7 days or as instructed.   Eat a soft diet for the next 24 hours.  FINDING OUT THE RESULTS OF YOUR TEST Not all test results are available during your visit. If your test results are not back  during the visit, make an appointment with your caregiver to find out the results. Do not assume everything is normal if you have not heard from your caregiver or the medical facility. It is important for you to follow up on all of your test results.  SEEK IMMEDIATE MEDICAL ATTENTION IF:  You have more than a spotting of blood in your stool.   Your belly is swollen (abdominal distention).   You are nauseated or vomiting.   You have a temperature over 101.   You have abdominal pain or discomfort that is severe or gets worse throughout the day.    Diverticulosis information provided  Continue Benefiber 1 tablespoon twice daily  Repeat colonoscopy in 5 years

## 2016-09-28 NOTE — H&P (View-Only) (Signed)
Primary Care Physician:  Sharilyn Sites, MD Primary Gastroenterologist:  Dr. Gala Romney  Pre-Procedure History & Physical: HPI:  Emily Valentine is a 66 y.o. female here for follow-up after being diagnosed with diverticulitis about 4 weeks ago. Went to the ED for left lower quadrant abdominal pain. CT demonstrated inflammatory changes sigmoid colon. Given Cipro and Flagyl. The symptoms have resolved. She is chronically constipated. History of colonic adenoma removed by me 2012. Father with colon cancer but at an advanced age. She's not had any rectal bleeding. She's needs a colonoscopy at this time given her personal history of colonic polyps and abnormal: Imaging on CT.  Past Medical History:  Diagnosis Date  . History of echocardiogram    Echo (8/15): Vigorous LV function, EF 65-70%, normal wall motion, grade 1 diastolic dysfunction, mild to moderate TR, PASP 31 mm Hg  . Hx of cardiovascular stress test    ETT-Myoview (8/15): no ischemia, EF 56%, normal study  . Hypertension   . Hypothyroid   . Pernicious anemia   . S/P colonoscopy 2001, 2004   2001: hyperplastic polyp, 2004: normal, inflammatory polyp    Past Surgical History:  Procedure Laterality Date  . ABDOMINAL HYSTERECTOMY    . BACK SURGERY    . BREAST LUMPECTOMY WITH RADIOACTIVE SEED LOCALIZATION Right 02/21/2015   Procedure: RIGHT BREAST LUMPECTOMY WITH RADIOACTIVE SEED LOCALIZATION;  Surgeon: Rolm Bookbinder, MD;  Location: [facility];  Service: General;  Laterality: Right;  . COLONOSCOPY  11/10/2010   Procedure: COLONOSCOPY;  Surgeon: Daneil Dolin, MD;  [facility];  Service: Endoscopy;  Laterality: N/A;  . THYROIDECTOMY      Prior to Admission medications   Medication Sig Start Date End Date Taking? Authorizing Provider  aspirin EC 81 MG tablet Take 1 tablet (81 mg total) by mouth daily. 10/04/13  Yes Weaver, Scott T, PA-C  cholecalciferol (VITAMIN D) 1000 units tablet Take 1,000 Units by  mouth daily.   Yes [provider]  Cyanocobalamin (VITAMIN B-12 IJ) Inject as directed every 30 (thirty) days.     Yes [provider]  folic acid (FOLVITE) 1 MG tablet Take 1 mg by mouth daily.   Yes [provider]  levothyroxine (SYNTHROID, LEVOTHROID) 100 MCG tablet Take 100 mcg by mouth daily.     Yes [provider]  losartan-hydrochlorothiazide (HYZAAR) 100-25 MG tablet TAKE ONE TABLET BY MOUTH ONCE DAILY. 06/16/16  Yes Josue Hector, MD  magnesium hydroxide (MILK OF MAGNESIA) 400 MG/5ML suspension Take 30 mLs by mouth daily as needed (constipation).    Yes [provider]  Methotrexate, Anti-Rheumatic, (METHOTREXATE, PF, [place]) Inject 10 mg into the skin once a week.    Yes [provider]  metoprolol succinate (TOPROL-XL) 25 MG 24 hr tablet TAKE (1) TABLET BY MOUTH DAILY. 09/18/15  Yes Josue Hector, MD  Multiple Vitamins-Minerals (MULTIVITAMIN WITH MINERALS) tablet Take 1 tablet by mouth daily.   Yes [provider]  nitroGLYCERIN (NITROSTAT) 0.4 MG SL tablet Place 1 tablet (0.4 mg total) under the tongue every 5 (five) minutes as needed for chest pain. 09/18/15  Yes Josue Hector, MD  Omega-3 Fatty Acids (FISH OIL) 1000 MG CAPS Take 1 capsule by mouth daily.   Yes [provider]    Allergies as of 09/15/2016 - Review Complete 09/15/2016  Allergen Reaction Noted  . Codeine Nausea Only 08/24/2016  . Oxycontin [oxycodone] Nausea And Vomiting 09/01/2011    Family History  Problem Relation Age of Onset  . Stomach cancer Mother        deceased  . Anemia Mother   . Cancer Mother   . Hypertension Mother   . Thyroid disease Mother   . Colon cancer Father        diagnosed age 109, died at age 104  . Anemia Father   . Cancer Father   . Hypertension Father   . Anemia Sister   . Cancer Brother   . Diabetes Sister   . Hypertension Sister   . Hypertension Brother   . Thyroid disease Brother   . Thyroid disease  Sister     Social History   Social History  . Marital status: Married    Spouse name: N/A  . Number of children: N/A  . Years of education: N/A   Occupational History  . Not on file.   Social History Main Topics  . Smoking status: Current Every Day Smoker    Packs/day: 1.00    Years: 20.00    Types: Cigarettes  . Smokeless tobacco: Current User  . Alcohol use No  . Drug use: No  . Sexual activity: Yes    Birth control/ protection: Surgical   Other Topics Concern  . Not on file   Social History Narrative  . No narrative on file    Review of Systems: See HPI, otherwise negative ROS  Physical Exam: BP 131/84   Pulse 75   Temp (!) 97.2 F (36.2 C) (Oral)   Ht 5\' 2"  (1.575 m)   Wt 124 lb 6.4 oz (56.4 kg)   BMI 22.75 kg/m  General:   Alert,  , pleasant and cooperative in NAD Neck:  Supple; no masses or thyromegaly. No significant cervical adenopathy. Lungs:  Clear throughout to auscultation.   No wheezes, crackles, or rhonchi. No acute distress. Heart:  Regular rate and rhythm; no murmurs, clicks, rubs,  or gallops. Abdomen: Non-distended, normal bowel sounds.  Soft and nontender without appreciable mass or hepatosplenomegaly.  Pulses:  Normal pulses noted. Extremities:  Without clubbing or edema.  Impression:  Pleasant 66 year old lady with a recent left lower quadrant abdominal pain. Abnormal sigmoid colon consistent with diverticulitis Clinically, she is recovered from this acute illness. Family history of colon cancer in her father at advanced age. Personal history of colonic adenoma. Chronic constipation.  Recommendations:   I have offered the patient a diagnostic colonoscopy in the near future.  The risks, benefits, limitations, alternatives and imponderables have been reviewed with the patient. Questions have been answered. All parties are agreeable.   Schedule a diagnostic colonoscopy (conscious sedation)  Split prep  Linzess 145 daily - 3 days prior to  procedure   To manage constipation chronically:  Begin Benefiber 1 tablespoon twice daily   Take a capful of Miralax at night on any given day without a bowel movement  Further recommendations         Notice: This dictation was prepared with Dragon dictation along with smaller phrase technology. Any transcriptional errors that result from this process are unintentional and may not be corrected upon review.

## 2016-09-28 NOTE — Interval H&P Note (Signed)
History and Physical Interval Note:  09/28/2016 1:45 PM  Emily Valentine  has presented today for surgery, with the diagnosis of history colon polyps, abnormal CT -diverticulitis-  The various methods of treatment have been discussed with the patient and family. After consideration of risks, benefits and other options for treatment, the patient has consented to  Procedure(s) with comments: COLONOSCOPY (N/A) - 2:15pm - pt knows to arrive at 12:45 as a surgical intervention .  The patient's history has been reviewed, patient examined, no change in status, stable for surgery.  I have reviewed the patient's chart and labs.  Questions were answered to the patient's satisfaction.     Deeana Atwater  No change. Surveillance colonoscopy per plan.  The risks, benefits, limitations, alternatives and imponderables have been reviewed with the patient. Questions have been answered. All parties are agreeable.

## 2016-09-30 ENCOUNTER — Encounter (HOSPITAL_COMMUNITY): Payer: Self-pay | Admitting: Internal Medicine

## 2016-10-09 ENCOUNTER — Other Ambulatory Visit: Payer: Self-pay | Admitting: Family Medicine

## 2016-10-09 DIAGNOSIS — D241 Benign neoplasm of right breast: Secondary | ICD-10-CM

## 2016-10-16 ENCOUNTER — Other Ambulatory Visit: Payer: Medicare Other

## 2016-11-02 ENCOUNTER — Other Ambulatory Visit: Payer: Self-pay | Admitting: Cardiovascular Disease

## 2016-11-10 ENCOUNTER — Other Ambulatory Visit: Payer: Self-pay | Admitting: Family Medicine

## 2016-11-10 ENCOUNTER — Ambulatory Visit
Admission: RE | Admit: 2016-11-10 | Discharge: 2016-11-10 | Disposition: A | Discharge status: Home / Self Care | Payer: Medicare Other | Source: Ambulatory Visit | Attending: Family Medicine | Admitting: Family Medicine

## 2016-11-10 ENCOUNTER — Ambulatory Visit: Payer: Medicare Other

## 2016-11-10 DIAGNOSIS — Z1231 Encounter for screening mammogram for malignant neoplasm of breast: Secondary | ICD-10-CM

## 2016-11-10 DIAGNOSIS — D241 Benign neoplasm of right breast: Secondary | ICD-10-CM

## 2016-12-14 ENCOUNTER — Other Ambulatory Visit: Payer: Self-pay | Admitting: Cardiovascular Disease

## 2016-12-28 ENCOUNTER — Telehealth: Payer: Self-pay | Admitting: Cardiovascular Disease

## 2016-12-28 MED ORDER — METOPROLOL SUCCINATE ER 25 MG PO TB24: 25.0000 mg | ORAL_TABLET | Freq: Every day | ORAL | 2 refills | Status: DC

## 2016-12-28 NOTE — Telephone Encounter (Signed)
New message     *STAT* If patient is at the pharmacy, call can be transferred to refill team.   1. Which medications need to be refilled? (please list name of each medication and dose if known) metoprolol 25 mg  2. Which pharmacy/location (including street and city if local pharmacy) is medication to be sent to? [facility] in [place]   3. Do they need a 30 day or 90 day supply? 30 day

## 2016-12-28 NOTE — Telephone Encounter (Signed)
Pt's medication was sent to pt's pharmacy as requested. Confirmation received.  °

## 2017-01-11 ENCOUNTER — Telehealth: Payer: Self-pay | Admitting: Cardiovascular Disease

## 2017-01-11 MED ORDER — LOSARTAN POTASSIUM-HCTZ 100-25 MG PO TABS: 1.0000 | ORAL_TABLET | Freq: Every day | ORAL | 2 refills | Status: DC

## 2017-01-11 NOTE — Telephone Encounter (Signed)
New message    *STAT* If patient is at the pharmacy, call can be transferred to refill team.   1. Which medications need to be refilled? (please list name of each medication and dose if known) losartan-hydrochlorothiazide (HYZAAR) 100-25 MG tablet  2. Which pharmacy/location (including street and city if local pharmacy) is medication to be sent to? [facility] in [place]  3. Do they need a 30 day or 90 day supply? [facility]

## 2017-01-11 NOTE — Telephone Encounter (Signed)
Pt's medication was sent to pt's pharmacy as requested. Confirmation received.  °

## 2017-03-15 NOTE — Progress Notes (Signed)
Cardiology Office Note   Date:  03/23/2017   ID:  Emily, Valentine [birth year], MRN [id number]  PCP:  Emily Sites, MD  Cardiologist: Darlin Coco MD  No chief complaint on file.     History of Present Illness: Emily Valentine is a 66 y.o. female who presents for f/u PVC;s, RBBB, atypical chest pain  Previous patient of  Dr Luz Lex  She was seen on 10/04/13 for complaints of chest tightness and exertional dyspnea. She underwent a echocardiogram as well as a treadmill Myoview, both studies on 10/16/13. The Myoview showed no evidence of ischemia and her ejection fraction was 56%. The echocardiogram showed an ejection fraction of 65-70% with vigorous contraction and no significant valve abnormalities.since last visit she has been on low dose Toprol 25 mg daily and has felt better. Her PVCs have diminished.  She is still smoking about a pack of cigarettes a day against advice.  She has some wheezing at night On her left side    Past Medical History:  Diagnosis Date  . History of echocardiogram    Echo (8/15): Vigorous LV function, EF 65-70%, normal wall motion, grade 1 diastolic dysfunction, mild to moderate TR, PASP 31 mm Hg  . Hx of cardiovascular stress test    ETT-Myoview (8/15): no ischemia, EF 56%, normal study  . Hypertension   . Hypothyroid   . Pernicious anemia   . S/P colonoscopy 2001, 2004   2001: hyperplastic polyp, 2004: normal, inflammatory polyp    Past Surgical History:  Procedure Laterality Date  . ABDOMINAL HYSTERECTOMY    . BACK SURGERY    . BREAST BIOPSY Right 10/29/2010  . BREAST BIOPSY Right 11/27/2014  . BREAST EXCISIONAL BIOPSY Right 02/21/2015  . BREAST LUMPECTOMY WITH RADIOACTIVE SEED LOCALIZATION Right 02/21/2015   Procedure: RIGHT BREAST LUMPECTOMY WITH RADIOACTIVE SEED LOCALIZATION;  Surgeon: Rolm Bookbinder, MD;  Location: [facility];  Service: General;  Laterality: Right;  . COLONOSCOPY  11/10/2010   Procedure:  COLONOSCOPY;  Surgeon: Daneil Dolin, MD;  [facility];  Service: Endoscopy;  Laterality: N/A;  . COLONOSCOPY N/A 09/28/2016   Procedure: COLONOSCOPY;  Surgeon: Daneil Dolin, MD;  [facility];  Service: Endoscopy;  Laterality: N/A;  2:15pm - pt knows to arrive at 12:45  . THYROIDECTOMY       Current Outpatient Medications  Medication Sig Dispense Refill  . acetaminophen (TYLENOL) 650 MG CR tablet Take 650 mg by mouth 2 (two) times daily as needed for pain.    Marland Kitchen aspirin EC 81 MG tablet Take 1 tablet (81 mg total) by mouth daily.    . Cholecalciferol (VITAMIN D3) 2000 units capsule Take 2,000 Units by mouth daily at 12 noon.    . Cyanocobalamin (VITAMIN B-12 IJ) Inject 1,000 mcg as directed every 30 (thirty) days.     . diphenhydrAMINE (BENADRYL) 25 MG tablet Take 25 mg by mouth daily as needed for allergies.    . folic acid (FOLVITE) 1 MG tablet Take 1 mg by mouth daily.    . hydrocortisone cream 1 % Apply 1 application topically daily as needed for itching.    . levothyroxine (SYNTHROID, LEVOTHROID) 100 MCG tablet Take 100 mcg by mouth daily before breakfast.     . linaclotide (LINZESS) 145 MCG CAPS capsule Take 145 mcg by mouth daily before breakfast. Take 3 days prior to Colonoscopy    . losartan-hydrochlorothiazide (HYZAAR) 100-25 MG tablet Take 1 tablet by mouth daily.  Please keep upcoming appt for future refills. Thanks 30 tablet 11  . meloxicam (MOBIC) 15 MG tablet Take 15 mg by mouth daily as needed for pain.    . Methotrexate, Anti-Rheumatic, (METHOTREXATE, PF, [place]) Inject 25 mg into the skin every Monday.     . metoprolol succinate (TOPROL-XL) 25 MG 24 hr tablet Take 1 tablet (25 mg total) by mouth daily. 30 tablet 11  . Multiple Vitamins-Minerals (MULTIVITAMIN WITH MINERALS) tablet Take 1 tablet by mouth daily at 12 noon.     . nitroGLYCERIN (NITROSTAT) 0.4 MG SL tablet Place 1 tablet (0.4 mg total) under the tongue every 5 (five) minutes as needed for chest  pain. 25 tablet 1  . Omega-3 Fatty Acids (FISH OIL) 1200 MG CAPS Take 1,200 mg by mouth daily at 12 noon.    . polyethylene glycol (MIRALAX / GLYCOLAX) packet Take 17 g by mouth daily as needed for mild constipation.    . polyethylene glycol-electrolytes (TRILYTE) 420 g solution Take 4,000 mLs by mouth as directed. 4000 mL 0  . Wheat Dextrin (BENEFIBER) POWD Take 1 Dose by mouth 2 (two) times daily.     No current facility-administered medications for this visit.     Allergies:   Codeine and Oxycontin [oxycodone]    Social History:  The patient  reports that she has been smoking cigarettes.  She has a 20.00 pack-year smoking history. She uses smokeless tobacco. She reports that she does not drink alcohol or use drugs.   Family History:  The patient's family history includes Anemia in her father, mother, and sister; Cancer in her brother, father, and mother; Colon cancer in her father; Diabetes in her sister; Hypertension in her brother, father, mother, and sister; Stomach cancer in her mother; Thyroid disease in her brother, mother, and sister.    ROS:  Please see the history of present illness.   Otherwise, review of systems are positive for none.   All other systems are reviewed and negative.    PHYSICAL EXAM: VS:  BP (!) 148/92   Pulse 70   Ht 5\' 2"  (1.575 m)   Wt 127 lb (57.6 kg)   SpO2 98%   BMI 23.23 kg/m  , BMI Body mass index is 23.23 kg/m. Affect appropriate Healthy:  appears stated age 65: normal Neck supple with no adenopathy JVP normal no bruits no thyromegaly Lungs clear with no wheezing and good diaphragmatic motion Heart:  S1/S2 no murmur, no rub, gallop or click PMI normal Abdomen: benighn, BS positve, no tenderness, no AAA no bruit.  No HSM or HJR Distal pulses intact with no bruits No edema Neuro non-focal Skin warm and dry No muscular weakness    EKG:  09/18/15  SR  RBBB PVC;s 09/18/15  SR RBB RAE LAD no change 03/23/17 SR rate 68 RBBB LAD    Recent  Labs: 08/24/2016: BUN 21; Creatinine, Ser 1.12; Hemoglobin 13.7; Platelets 274; Potassium 3.9; Sodium 139    Lipid Panel    Component Value Date/Time   CHOL 151 10/16/2013 1023   TRIG 116.0 10/16/2013 1023   HDL 37.50 (L) 10/16/2013 1023   CHOLHDL 4 10/16/2013 1023   VLDL 23.2 10/16/2013 1023   LDLCALC 90 10/16/2013 1023      Wt Readings from Last 3 Encounters:  03/23/17 127 lb (57.6 kg)  09/28/16 124 lb (56.2 kg)  09/15/16 124 lb 6.4 oz (56.4 kg)       ASSESSMENT AND PLAN:  1. HTN:  Well controlled.  Continue current medications and low sodium Dash type diet.     2. Chest Pain:  Normal myovue 10/17/2013  3.PVCs, improved on beta blocker.normal EF no documented CAD  4. Smoking: counseled on cessation suggested lung cancer screening CT last year did not follow through  She will check with insurance and let us know if she wants to order 5. Right bundle branch block,asymptomatic stable yearly ECG   F/u in a year    Jenkins Rouge

## 2017-03-23 ENCOUNTER — Encounter: Payer: Self-pay | Admitting: Cardiovascular Disease

## 2017-03-23 ENCOUNTER — Ambulatory Visit: Payer: Medicare Other | Admitting: Cardiovascular Disease

## 2017-03-23 VITALS — BP 148/92 | HR 70 | Ht 62.0 in | Wt 127.0 lb

## 2017-03-23 DIAGNOSIS — I493 Ventricular premature depolarization: Secondary | ICD-10-CM | POA: Diagnosis not present

## 2017-03-23 DIAGNOSIS — I10 Essential (primary) hypertension: Secondary | ICD-10-CM

## 2017-03-23 MED ORDER — LOSARTAN POTASSIUM-HCTZ 100-25 MG PO TABS: 1.0000 | ORAL_TABLET | Freq: Every day | ORAL | 11 refills | Status: DC

## 2017-03-23 MED ORDER — METOPROLOL SUCCINATE ER 25 MG PO TB24: 25.0000 mg | ORAL_TABLET | Freq: Every day | ORAL | 11 refills | Status: DC

## 2017-03-23 NOTE — Patient Instructions (Addendum)

## 2017-06-17 ENCOUNTER — Ambulatory Visit: Payer: Medicare Other | Admitting: Neurology

## 2017-06-17 ENCOUNTER — Encounter: Payer: Self-pay | Admitting: Neurology

## 2017-06-17 ENCOUNTER — Other Ambulatory Visit: Payer: Self-pay

## 2017-06-17 VITALS — BP 130/82 | HR 73 | Ht 63.0 in | Wt 125.0 lb

## 2017-06-17 DIAGNOSIS — R413 Other amnesia: Secondary | ICD-10-CM

## 2017-06-17 NOTE — Progress Notes (Signed)
NEUROLOGY FOLLOW UP OFFICE NOTE  Emily Valentine [id number] Jul 28, 1950  HISTORY OF PRESENT ILLNESS: I had the pleasure of seeing Emily Valentine in follow-up in the neurology clinic on 06/17/2017.  The patient was last seen a year ago for memory changes. She is again accompanied by her husband who helps supplement the history today. MOCA score 68/30 in April 2018. She and her husband report that memory has been stable since her last visit. She denies getting lost driving, no missed medications or bill payments. She does note she is concerned about her memory, but states she is here because of her husband's concerns. He is concerned due to his own experience with family members with dementia. She gets a little more irritable, no hallucinations or paranoia. She is independent with dressing and bathing. She denies any headaches, dizziness, vision changes, focal numbness/tingling/weakness, no falls.   HPI 06/16/2016: This is a 67 yo RH woman with a history of hypertension, hyperthyroidism, B12 deficiency who presented for memory changes. She states she has always had a hard time "bringing stuff back" for the past 15 years, where she would take notes and have difficulties putting them back into a sentence, but has noticed some worsening in the past 5 years. She has noticed some word-finding difficulties, and states for instance she had difficulty saying her age when asked today. Her husband noticed that over the past year she would go off topic in a conversation or would not catch up as quickly. She eventually would catch up. One time she did not remember getting an injection in her foot, she states this was because it was a traumatic event she did not want to remember. She denies getting lost driving, she is in charge of bill payments and her medications, and has no difficulties managing them. She stays pretty organized has always been detail-oriented, writing things down. She has occasional mood swings, but  these are not new, no significant personality changes, no hallucinations. No difficulties with ADLs. There is no family history of dementia, no history of significant head injuries or alcohol intake. She has rare tremors on her right hand. She has some balance issues, denies any falls.  She retired in 2013.  PAST MEDICAL HISTORY: Past Medical History:  Diagnosis Date  . History of echocardiogram    Echo (8/15): Vigorous LV function, EF 65-70%, normal wall motion, grade 1 diastolic dysfunction, mild to moderate TR, PASP 31 mm Hg  . Hx of cardiovascular stress test    ETT-Myoview (8/15): no ischemia, EF 56%, normal study  . Hypertension   . Hypothyroid   . Pernicious anemia   . S/P colonoscopy 2001, 2004   2001: hyperplastic polyp, 2004: normal, inflammatory polyp    MEDICATIONS: Current Outpatient Medications on File Prior to Visit  Medication Sig Dispense Refill  . acetaminophen (TYLENOL) 650 MG CR tablet Take 650 mg by mouth 2 (two) times daily as needed for pain.    Marland Kitchen aspirin EC 81 MG tablet Take 1 tablet (81 mg total) by mouth daily.    . Cholecalciferol (VITAMIN D3) 2000 units capsule Take 2,000 Units by mouth daily at 12 noon.    . Cyanocobalamin (VITAMIN B-12 IJ) Inject 1,000 mcg as directed every 30 (thirty) days.     . diphenhydrAMINE (BENADRYL) 25 MG tablet Take 25 mg by mouth daily as needed for allergies.    . folic acid (FOLVITE) 1 MG tablet Take 1 mg by mouth daily.    . hydrocortisone cream 1 %  Apply 1 application topically daily as needed for itching.    . levothyroxine (SYNTHROID, LEVOTHROID) 100 MCG tablet Take 100 mcg by mouth daily before breakfast.     . linaclotide (LINZESS) 145 MCG CAPS capsule Take 145 mcg by mouth daily before breakfast. Take 3 days prior to Colonoscopy    . losartan-hydrochlorothiazide (HYZAAR) 100-25 MG tablet Take 1 tablet by mouth daily. Please keep upcoming appt for future refills. Thanks 30 tablet 11  . meloxicam (MOBIC) 15 MG tablet Take 15  mg by mouth daily as needed for pain.    . Methotrexate, Anti-Rheumatic, (METHOTREXATE, PF, [place]) Inject 25 mg into the skin every Monday.     . metoprolol succinate (TOPROL-XL) 25 MG 24 hr tablet Take 1 tablet (25 mg total) by mouth daily. 30 tablet 11  . Multiple Vitamins-Minerals (MULTIVITAMIN WITH MINERALS) tablet Take 1 tablet by mouth daily at 12 noon.     . nitroGLYCERIN (NITROSTAT) 0.4 MG SL tablet Place 1 tablet (0.4 mg total) under the tongue every 5 (five) minutes as needed for chest pain. 25 tablet 1  . Omega-3 Fatty Acids (FISH OIL) 1200 MG CAPS Take 1,200 mg by mouth daily at 12 noon.    . polyethylene glycol (MIRALAX / GLYCOLAX) packet Take 17 g by mouth daily as needed for mild constipation.    . polyethylene glycol-electrolytes (TRILYTE) 420 g solution Take 4,000 mLs by mouth as directed. 4000 mL 0  . Wheat Dextrin (BENEFIBER) POWD Take 1 Dose by mouth 2 (two) times daily.     No current facility-administered medications on file prior to visit.     ALLERGIES: Allergies  Allergen Reactions  . Codeine Nausea Only  . Oxycontin [Oxycodone] Nausea And Vomiting    FAMILY HISTORY: Family History  Problem Relation Age of Onset  . Stomach cancer Mother        deceased  . Anemia Mother   . Cancer Mother   . Hypertension Mother   . Thyroid disease Mother   . Colon cancer Father        diagnosed age 50, died at age 62  . Anemia Father   . Cancer Father   . Hypertension Father   . Anemia Sister   . Cancer Brother   . Diabetes Sister   . Hypertension Sister   . Hypertension Brother   . Thyroid disease Brother   . Thyroid disease Sister     SOCIAL HISTORY: Social History   Socioeconomic History  . Marital status: Married    Spouse name: Not on file  . Number of children: Not on file  . Years of education: Not on file  . Highest education level: Not on file  Occupational History  . Not on file  Social Needs  . Financial resource strain: Not on file  . Food  insecurity:    Worry: Not on file    Inability: Not on file  . Transportation needs:    Medical: Not on file    Non-medical: Not on file  Tobacco Use  . Smoking status: Current Every Day Smoker    Packs/day: 1.00    Years: 20.00    Pack years: 20.00    Types: Cigarettes  . Smokeless tobacco: Current User  Substance and Sexual Activity  . Alcohol use: No  . Drug use: No  . Sexual activity: Yes    Birth control/protection: Surgical  Lifestyle  . Physical activity:    Days per week: Not on file  Minutes per session: Not on file  . Stress: Not on file  Relationships  . Social connections:    Talks on phone: Not on file    Gets together: Not on file    Attends religious service: Not on file    Active member of club or organization: Not on file    Attends meetings of clubs or organizations: Not on file    Relationship status: Not on file  . Intimate partner violence:    Fear of current or ex partner: Not on file    Emotionally abused: Not on file    Physically abused: Not on file    Forced sexual activity: Not on file  Other Topics Concern  . Not on file  Social History Narrative  . Not on file    REVIEW OF SYSTEMS: Constitutional: No fevers, chills, or sweats, no generalized fatigue, change in appetite Eyes: No visual changes, double vision, eye pain Ear, nose and throat: No hearing loss, ear pain, nasal congestion, sore throat Cardiovascular: No chest pain, palpitations Respiratory:  No shortness of breath at rest or with exertion, wheezes GastrointestinaI: No nausea, vomiting, diarrhea, abdominal pain, fecal incontinence Genitourinary:  No dysuria, urinary retention or frequency Musculoskeletal:  No neck pain, back pain Integumentary: No rash, pruritus, skin lesions Neurological: as above Psychiatric: No depression, insomnia, anxiety Endocrine: No palpitations, fatigue, diaphoresis, mood swings, change in appetite, change in weight, increased  thirst Hematologic/Lymphatic:  No anemia, purpura, petechiae. Allergic/Immunologic: no itchy/runny eyes, nasal congestion, recent allergic reactions, rashes  PHYSICAL EXAM: Vitals:   06/17/17 1011  BP: 130/82  Pulse: 73  SpO2: 96%   General: No acute distress Head:  Normocephalic/atraumatic Neck: supple, no paraspinal tenderness, full range of motion Heart:  Regular rate and rhythm Lungs:  Clear to auscultation bilaterally Back: No paraspinal tenderness Skin/Extremities: No rash, no edema Neurological Exam: alert and oriented to person, place, and time. No aphasia or dysarthria. Fund of knowledge is appropriate.  Recent and remote memory are intact.  Attention and concentration are normal.    Able to name objects and repeat phrases.  Montreal Cognitive Assessment  06/17/2017 06/16/2016  Visuospatial/ Executive (0/5) 5 4  Naming (0/3) 3 3  Attention: Read list of digits (0/2) 2 2  Attention: Read list of letters (0/1) 1 1  Attention: Serial 7 subtraction starting at 100 (0/3) 2 2  Language: Repeat phrase (0/2) 2 2  Language : Fluency (0/1) 1 1  Abstraction (0/2) 2 2  Delayed Recall (0/5) 4 4  Orientation (0/6) 6 6  Total 28 27   Cranial nerves: Pupils equal, round, reactive to light.  Extraocular movements intact with no nystagmus. Visual fields full. Facial sensation intact. No facial asymmetry. Tongue, uvula, palate midline.  Motor: Bulk and tone normal, muscle strength 5/5 throughout with no pronator drift.  Sensation to light touch intact.  No extinction to double simultaneous stimulation.  Deep tendon reflexes 2+ throughout, toes downgoing.  Finger to nose testing intact.  Gait narrow-based and steady, able to tandem walk adequately.  Romberg negative.  IMPRESSION: This is a 67 yo RH woman with a history of hypertension, hyperthyroidism, pernicious anemia, who presented for evaluation of memory changes. Her neurological exam is non-focal, MOCA score today again normal 28/30 (27/30  in April 2018). We again discussed there is no evidence of a dementia process at this time. We discussed doing Neurocognitive testing if there is continued concern, the patient and her husband would like to hold off for  now. No indication to start cholinesterase inhibitors. We again discussed the importance of control of vascular risk factors, physical exercise, and brain stimulation exercises for brain health. She will follow-up in 1 year and knows to call for any changes.   Thank you for allowing me to participate in her care.  Please do not hesitate to call for any questions or concerns.  The duration of this appointment visit was 30 minutes of face-to-face time with the patient.  Greater than 50% of this time was spent in counseling, explanation of diagnosis, planning of further management, and coordination of care.   Ellouise Newer, M.D.   CC: Dr. Hilma Favors

## 2017-06-17 NOTE — Patient Instructions (Addendum)
Great seeing you! Follow-up in 1 year, call for any changes and we can proceed with Neurocognitive testing.  RECOMMENDATIONS FOR ALL PATIENTS WITH MEMORY PROBLEMS: 1. Continue to exercise (Recommend 30 minutes of walking everyday, or 3 hours every week) 2. Increase social interactions - continue going to [place] and enjoy social gatherings with friends and family 3. Eat healthy, avoid fried foods and eat more fruits and vegetables 4. Maintain adequate blood pressure, blood sugar, and blood cholesterol level. Reducing the risk of stroke and cardiovascular disease also helps promoting better memory. 5. Avoid stressful situations. Live a simple life and avoid aggravations. Organize your time and prepare for the next day in anticipation. 6. Sleep well, avoid any interruptions of sleep and avoid any distractions in the bedroom that may interfere with adequate sleep quality 7. Avoid sugar, avoid sweets as there is a strong link between excessive sugar intake, diabetes, and cognitive impairment We discussed the Mediterranean diet, which has been shown to help patients reduce the risk of progressive memory disorders and reduces cardiovascular risk. This includes eating fish, eat fruits and green leafy vegetables, nuts like almonds and hazelnuts, walnuts, and also use olive oil. Avoid fast foods and fried foods as much as possible. Avoid sweets and sugar as sugar use has been linked to worsening of memory function.

## 2017-06-26 DIAGNOSIS — R413 Other amnesia: Secondary | ICD-10-CM | POA: Insufficient documentation

## 2017-06-28 ENCOUNTER — Other Ambulatory Visit (HOSPITAL_COMMUNITY): Payer: Self-pay | Admitting: Family Medicine

## 2017-06-28 DIAGNOSIS — F17210 Nicotine dependence, cigarettes, uncomplicated: Secondary | ICD-10-CM

## 2017-07-13 ENCOUNTER — Ambulatory Visit (HOSPITAL_COMMUNITY)
Admission: RE | Admit: 2017-07-13 | Discharge: 2017-07-13 | Disposition: A | Discharge status: Home / Self Care | Payer: Medicare Other | Source: Ambulatory Visit | Attending: Family Medicine | Admitting: Family Medicine

## 2017-07-13 DIAGNOSIS — F17210 Nicotine dependence, cigarettes, uncomplicated: Secondary | ICD-10-CM | POA: Diagnosis present

## 2017-07-13 DIAGNOSIS — I700 Atherosclerosis of aorta: Secondary | ICD-10-CM | POA: Diagnosis not present

## 2017-07-13 DIAGNOSIS — I2510 Atherosclerotic heart disease of native coronary artery without angina pectoris: Secondary | ICD-10-CM | POA: Diagnosis not present

## 2017-07-13 DIAGNOSIS — J438 Other emphysema: Secondary | ICD-10-CM | POA: Diagnosis not present

## 2017-07-13 DIAGNOSIS — J432 Centrilobular emphysema: Secondary | ICD-10-CM | POA: Diagnosis not present

## 2017-07-30 ENCOUNTER — Other Ambulatory Visit (HOSPITAL_COMMUNITY): Payer: Self-pay | Admitting: Family Medicine

## 2017-07-30 DIAGNOSIS — E2839 Other primary ovarian failure: Secondary | ICD-10-CM

## 2017-08-02 ENCOUNTER — Ambulatory Visit (HOSPITAL_COMMUNITY)
Admission: RE | Admit: 2017-08-02 | Discharge: 2017-08-02 | Disposition: A | Discharge status: Home / Self Care | Payer: Medicare Other | Source: Ambulatory Visit | Attending: Family Medicine | Admitting: Family Medicine

## 2017-08-02 DIAGNOSIS — E2839 Other primary ovarian failure: Secondary | ICD-10-CM | POA: Insufficient documentation

## 2017-08-02 DIAGNOSIS — M85851 Other specified disorders of bone density and structure, right thigh: Secondary | ICD-10-CM | POA: Insufficient documentation

## 2017-10-06 ENCOUNTER — Other Ambulatory Visit: Payer: Self-pay | Admitting: Family Medicine

## 2017-10-06 DIAGNOSIS — Z1231 Encounter for screening mammogram for malignant neoplasm of breast: Secondary | ICD-10-CM

## 2017-11-11 ENCOUNTER — Ambulatory Visit
Admission: RE | Admit: 2017-11-11 | Discharge: 2017-11-11 | Disposition: A | Discharge status: Home / Self Care | Payer: Medicare Other | Source: Ambulatory Visit | Attending: Family Medicine | Admitting: Family Medicine

## 2017-11-11 DIAGNOSIS — Z1231 Encounter for screening mammogram for malignant neoplasm of breast: Secondary | ICD-10-CM

## 2018-01-11 ENCOUNTER — Ambulatory Visit: Payer: Medicare Other | Admitting: Internal Medicine

## 2018-01-11 ENCOUNTER — Encounter: Payer: Self-pay | Admitting: Internal Medicine

## 2018-01-11 VITALS — BP 150/92 | HR 82 | Temp 97.0°F | Ht 62.0 in | Wt 128.4 lb

## 2018-01-11 DIAGNOSIS — Z86010 Personal history of colonic polyps: Secondary | ICD-10-CM

## 2018-01-11 DIAGNOSIS — K5730 Diverticulosis of large intestine without perforation or abscess without bleeding: Secondary | ICD-10-CM | POA: Diagnosis not present

## 2018-01-11 NOTE — Patient Instructions (Addendum)
Increase Benefiber to 1 tablespoon twice daily  Stop miralax  Begin linzess 145 daily x 2 weeks - samples provided  Call me in 2 weeks and let me know how you are doing and we will go from there.  Remember, Pepto-Bismol will turn your stool dark  Plan for repeat colonoscopy 2023  Office visit here in 3 months

## 2018-01-11 NOTE — Progress Notes (Signed)
Primary Care Physician:  Sharilyn Sites, MD Primary Gastroenterologist:  Dr. Gala Romney  Pre-Procedure History & Physical: HPI:  Emily Valentine is a 67 y.o. female here for follow-up of lower quadrant abdominal pain.  Seen by PCP.  Diagnosed with diverticulitis clinically and given a 7-day course of Cipro and Flagyl.  She improved.  She did know a couple of dark stools on occasion but was taking Pepto-Bismol.  Has taken fiber sporadically along with MiraLAX.  Would like to have a more simplified regimen.  History of colonic adenoma; due for surveillance colonoscopy 2023.  Past Medical History:  Diagnosis Date  . History of echocardiogram    Echo (8/15): Vigorous LV function, EF 65-70%, normal wall motion, grade 1 diastolic dysfunction, mild to moderate TR, PASP 31 mm Hg  . Hx of cardiovascular stress test    ETT-Myoview (8/15): no ischemia, EF 56%, normal study  . Hypertension   . Hypothyroid   . Pernicious anemia   . S/P colonoscopy 2001, 2004   2001: hyperplastic polyp, 2004: normal, inflammatory polyp    Past Surgical History:  Procedure Laterality Date  . ABDOMINAL HYSTERECTOMY    . BACK SURGERY    . BREAST BIOPSY Right 10/29/2010  . BREAST BIOPSY Right 11/27/2014  . BREAST EXCISIONAL BIOPSY Right 02/21/2015  . BREAST LUMPECTOMY WITH RADIOACTIVE SEED LOCALIZATION Right 02/21/2015   Procedure: RIGHT BREAST LUMPECTOMY WITH RADIOACTIVE SEED LOCALIZATION;  Surgeon: Rolm Bookbinder, MD;  Location: [facility];  Service: General;  Laterality: Right;  . COLONOSCOPY  11/10/2010   Procedure: COLONOSCOPY;  Surgeon: Daneil Dolin, MD;  [facility];  Service: Endoscopy;  Laterality: N/A;  . COLONOSCOPY N/A 09/28/2016   Procedure: COLONOSCOPY;  Surgeon: Daneil Dolin, MD;  Location: [facility];  Service: Endoscopy;  Laterality: N/A;  2:15pm - pt knows to arrive at 12:45  . THYROIDECTOMY      Prior to Admission medications   Medication Sig Start Date End  Date Taking? Authorizing Provider  acetaminophen (TYLENOL) 650 MG CR tablet Take 650 mg by mouth 2 (two) times daily as needed for pain.   Yes [provider]  aspirin EC 81 MG tablet Take 1 tablet (81 mg total) by mouth daily. 10/04/13  Yes Richardson Dopp T, PA-C  Cholecalciferol (VITAMIN D3) 2000 units capsule Take 2,000 Units by mouth daily at 12 noon.   Yes [provider]  Cyanocobalamin (VITAMIN B-12 IJ) Inject 1,000 mcg as directed every 30 (thirty) days.    Yes [provider]  folic acid (FOLVITE) 1 MG tablet Take 1 mg by mouth daily.   Yes [provider]  levothyroxine (SYNTHROID, LEVOTHROID) 88 MCG tablet Take 88 mcg by mouth daily before breakfast.    Yes [provider]  losartan-hydrochlorothiazide (HYZAAR) 100-25 MG tablet Take 1 tablet by mouth daily. Please keep upcoming appt for future refills. Thanks 03/23/17  Yes Josue Hector, MD  Methotrexate, Anti-Rheumatic, (METHOTREXATE, PF, [place]) Inject 25 mg into the skin every Monday.    Yes [provider]  metoprolol succinate (TOPROL-XL) 25 MG 24 hr tablet Take 1 tablet (25 mg total) by mouth daily. 03/23/17  Yes Josue Hector, MD  Multiple Vitamins-Minerals (MULTIVITAMIN WITH MINERALS) tablet Take 1 tablet by mouth daily at 12 noon.    Yes [provider]  nitroGLYCERIN (NITROSTAT) 0.4 MG SL tablet Place 1 tablet (0.4 mg total) under the tongue every 5 (five) minutes as needed for chest pain. 09/18/15  Yes Josue Hector, MD  Omega-3 Fatty Acids (FISH OIL) 1200 MG CAPS Take 1,200 mg by mouth daily at 12 noon.   Yes [provider]  polyethylene glycol (MIRALAX / GLYCOLAX) packet Take 17 g by mouth daily as needed for mild constipation.   Yes [provider]  Wheat Dextrin (BENEFIBER) POWD Take 1 Dose by mouth 2 (two) times daily.   Yes [provider]  diphenhydrAMINE (BENADRYL) 25 MG tablet Take 25 mg by mouth daily as needed for allergies.     [provider]  hydrocortisone cream 1 % Apply 1 application topically daily as needed for itching.    [provider]  linaclotide (LINZESS) 145 MCG CAPS capsule Take 145 mcg by mouth daily before breakfast. Take 3 days prior to Colonoscopy    [provider]  meloxicam (MOBIC) 15 MG tablet Take 15 mg by mouth daily as needed for pain.    [provider]  polyethylene glycol-electrolytes (TRILYTE) 420 g solution Take 4,000 mLs by mouth as directed. Patient not taking: Reported on 01/11/2018 09/15/16   Daneil Dolin, MD    Allergies as of 01/11/2018 - Review Complete 01/11/2018  Allergen Reaction Noted  . Codeine Nausea Only 08/24/2016  . Oxycontin [oxycodone] Nausea And Vomiting 09/01/2011    Family History  Problem Relation Age of Onset  . Stomach cancer Mother        deceased  . Anemia Mother   . Cancer Mother   . Hypertension Mother   . Thyroid disease Mother   . Colon cancer Father        diagnosed age 46, died at age 82  . Anemia Father   . Cancer Father   . Hypertension Father   . Anemia Sister   . Cancer Brother   . Diabetes Sister   . Hypertension Sister   . Hypertension Brother   . Thyroid disease Brother   . Thyroid disease Sister     Social History   Socioeconomic History  . Marital status: Married    Spouse name: Not on file  . Number of children: Not on file  . Years of education: Not on file  . Highest education level: Not on file  Occupational History  . Not on file  Social Needs  . Financial resource strain: Not on file  . Food insecurity:    Worry: Not on file    Inability: Not on file  . Transportation needs:    Medical: Not on file    Non-medical: Not on file  Tobacco Use  . Smoking status: Current Every Day Smoker    Packs/day: 1.00    Years: 20.00    Pack years: 20.00    Types: Cigarettes  . Smokeless tobacco: Former Network engineer and Sexual Activity  . Alcohol use: No  . Drug use: No  .  Sexual activity: Yes    Birth control/protection: Surgical  Lifestyle  . Physical activity:    Days per week: Not on file    Minutes per session: Not on file  . Stress: Not on file  Relationships  . Social connections:    Talks on phone: Not on file    Gets together: Not on file    Attends religious service: Not on file    Active member of club or organization: Not on file    Attends meetings of clubs or organizations: Not on file    Relationship status: Not on file  . Intimate  partner violence:    Fear of current or ex partner: Not on file    Emotionally abused: Not on file    Physically abused: Not on file    Forced sexual activity: Not on file  Other Topics Concern  . Not on file  Social History Narrative  . Not on file    Review of Systems: See HPI, otherwise negative ROS  Physical Exam: BP (!) 150/92   Pulse 82   Temp (!) 97 F (36.1 C) (Oral)   Ht 5\' 2"  (1.575 m)   Wt 128 lb 6.4 oz (58.2 kg)   BMI 23.48 kg/m  General:   Alert,  Well-developed, well-nourished, pleasant and cooperative in NAD Neck:  Supple; no masses or thyromegaly. No significant cervical adenopathy. Lungs:  Clear throughout to auscultation.   No wheezes, crackles, or rhonchi. No acute distress. Heart:  Regular rate and rhythm; no murmurs, clicks, rubs,  or gallops. Abdomen: Non-distended, normal bowel sounds.  Soft and nontender without appreciable mass or hepatosplenomegaly.  Pulses:  Normal pulses noted. Extremities:  Without clubbing or edema.  Impression/Plan: Recent bout of abdominal pain very similar to previously documented diverticulitis.  Better with a 7-day course of Cipro and Flagyl.  Constipation likely suboptimally managed at this time. She desires a more simplified regimen.  History of colonic adenoma; due for surveillance colonoscopy 2023.  I see no need to change that schedule. No need for surgical consultation at this time.  However I explained to the patient there is no way to  prevent diverticulitis and if she has recurrent episodes we may need to have further discussion regarding that avenue of treatment.    Recommendations:  Increase Benefiber to 1 tablespoon twice daily  Stop miralax  Begin linzess 145 daily x 2 weeks - samples provided  Call me in 2 weeks and let me know how you are doing and we will go from there.  Remember, Pepto-Bismol will turn your stool dark  Plan for repeat colonoscopy 2023  Office visit here in 3 months   Notice: This dictation was prepared with Dragon dictation along with smaller phrase technology. Any transcriptional errors that result from this process are unintentional and may not be corrected upon review.

## 2018-01-27 ENCOUNTER — Telehealth: Payer: Self-pay | Admitting: Internal Medicine

## 2018-01-27 NOTE — Telephone Encounter (Signed)
Pt has tried the LInzess 145 mg samples and she is out, but she is asking if RMR could increase the strength. Please advise and can she get more samples. [phone]

## 2018-01-27 NOTE — Telephone Encounter (Signed)
May increase Linzess to 290 daily.

## 2018-01-27 NOTE — Telephone Encounter (Signed)
Pt is taking Linzess 145 mcg daily along with Benefiber. Pt feels her stool does move but some days they don't move. Pt wants to increase the Linzess 290 mg. Please advise.

## 2018-01-27 NOTE — Telephone Encounter (Signed)
Noted. Spoke with pt and she would like to try samples before RX is sent. Linzess 290 mg samples are ready for pickup.

## 2018-04-11 ENCOUNTER — Other Ambulatory Visit: Payer: Self-pay | Admitting: Cardiovascular Disease

## 2018-04-12 ENCOUNTER — Other Ambulatory Visit: Payer: Self-pay | Admitting: Cardiovascular Disease

## 2018-04-19 ENCOUNTER — Encounter: Payer: Self-pay | Admitting: Gastroenterology

## 2018-04-19 ENCOUNTER — Ambulatory Visit: Payer: Medicare Other | Admitting: Gastroenterology

## 2018-04-19 DIAGNOSIS — K5900 Constipation, unspecified: Secondary | ICD-10-CM | POA: Diagnosis not present

## 2018-04-19 DIAGNOSIS — K5909 Other constipation: Secondary | ICD-10-CM | POA: Insufficient documentation

## 2018-04-19 NOTE — Progress Notes (Signed)
CC'D TO PCP °

## 2018-04-19 NOTE — Assessment & Plan Note (Signed)
Chronic constipation for over 40 years.  With current regimen she is having a bowel movement every other day, soft stool but feels like incomplete.  She did not respond well even to Linzess 290 mcg daily, stating it was ineffective.  At this point continue Benefiber 1 tablespoon twice daily.  MiraLAX 1 capful every single day, may hold 1 to 2 days if develops frequent loose stool.  She will call in a couple weeks and give [place] a progress report.  Also on exam she had some vague mid upper abdominal discomfort with palpation but denied any abdominal pain prior to the exam.  She will continue to monitor.  If she develops any abdominal pain she will let me know.  Otherwise we will see her back in 6 months.

## 2018-04-19 NOTE — Progress Notes (Signed)
Primary Care Physician: Sharilyn Sites, MD  Primary Gastroenterologist:  Garfield Cornea, MD   Chief Complaint  Patient presents with  . Constipation    TAKING MIRALAX AND BENEFIBER    HPI: Emily Valentine is a 68 y.o. female here for follow-up.  She was last seen in October.  Has a history of diverticulitis and constipation.  She was advised to increase her Benefiber to 1 tablespoon twice daily.  She was started on Linzess 145 mcg daily.  Due to suboptimal control of constipation this was increased to 290 mcg daily.  Last colonoscopy was in 2018.  She had diverticulosis with evidence of recent diverticulitis.  Previously had tubular adenoma removed in 2012.  Plans for follow-up colonoscopy in 2023.  Patient states neither the Linzess 145 mcg or the 290 mcg really produced productive bowel movement.  Absolutely denies any loose stools with the regimen.  She continues to take Benefiber 1 tablespoon twice daily.  She went back to [facility] using it about every other day.  With current regimen she has a bowel movement about every other day.  Stools are soft.  Sometimes still does not feel complete.  No melena or rectal bleeding.  No abdominal pain.  Left lower quadrant pain resolved after last dose of antibiotics.  She has had issues with her bowels since she was in her 33s.  Used milk of magnesia once a week for years.  No upper GI symptoms.   Current Outpatient Medications  Medication Sig Dispense Refill  . acetaminophen (TYLENOL) 650 MG CR tablet Take 650 mg by mouth 2 (two) times daily as needed for pain.    Marland Kitchen aspirin EC 81 MG tablet Take 1 tablet (81 mg total) by mouth daily.    . Cholecalciferol (VITAMIN D3) 2000 units capsule Take 2,000 Units by mouth daily at 12 noon.    . Cyanocobalamin (VITAMIN B-12 IJ) Inject 1,000 mcg as directed every 30 (thirty) days.     . folic acid (FOLVITE) 1 MG tablet Take 1 mg by mouth daily.    . hydrocortisone cream 1 % Apply 1 application topically  daily as needed for itching.    . levothyroxine (SYNTHROID, LEVOTHROID) 88 MCG tablet Take 88 mcg by mouth daily before breakfast.     . losartan-hydrochlorothiazide (HYZAAR) 100-25 MG tablet Take 1 tablet by mouth daily. Please make overdue appt with Dr. Johnsie Cancel before anymore refills. 1st attempt 30 tablet 0  . meloxicam (MOBIC) 15 MG tablet Take 15 mg by mouth daily as needed for pain.    . Methotrexate, Anti-Rheumatic, (METHOTREXATE, PF, [place]) Inject 25 mg into the skin every Monday.     . metoprolol succinate (TOPROL-XL) 25 MG 24 hr tablet TAKE (1) TABLET BY MOUTH DAILY. 30 tablet 0  . Multiple Vitamins-Minerals (MULTIVITAMIN WITH MINERALS) tablet Take 1 tablet by mouth daily at 12 noon.     . nitroGLYCERIN (NITROSTAT) 0.4 MG SL tablet Place 1 tablet (0.4 mg total) under the tongue every 5 (five) minutes as needed for chest pain. 25 tablet 1  . Wheat Dextrin (BENEFIBER) POWD Take 1 Dose by mouth 2 (two) times daily.    . polyethylene glycol (MIRALAX / GLYCOLAX) packet Take 17 g by mouth daily as needed for mild constipation.     No current facility-administered medications for this visit.     Allergies as of 04/19/2018 - Review Complete 04/19/2018  Allergen Reaction Noted  . Codeine Nausea Only 08/24/2016  . Oxycontin [oxycodone]  Nausea And Vomiting 09/01/2011    ROS:  General: Negative for anorexia, weight loss, fever, chills, fatigue, weakness. ENT: Negative for hoarseness, difficulty swallowing , nasal congestion. CV: Negative for chest pain, angina, palpitations, dyspnea on exertion, peripheral edema.  Respiratory: Negative for dyspnea at rest, dyspnea on exertion, cough, sputum, wheezing.  GI: See history of present illness. GU:  Negative for dysuria, hematuria, urinary incontinence, urinary frequency, nocturnal urination.  Endo: Negative for unusual weight change.    Physical Examination:   BP 133/90   Pulse 92   Temp (!) 97 F (36.1 C) (Oral)   Ht 5\' 1"  (1.549 m)   Wt  126 lb 9.6 oz (57.4 kg)   BMI 23.92 kg/m   General: Well-nourished, well-developed in no acute distress.  Eyes: No icterus. Mouth: Oropharyngeal mucosa moist and pink , no lesions erythema or exudate. Lungs: Clear to auscultation bilaterally.  Heart: Regular rate and rhythm, no murmurs rubs or gallops.  Abdomen: Bowel sounds are normal, nondistended, no hepatosplenomegaly or masses, no abdominal bruits or hernia , no rebound or guarding.  Has very vague, mild discomfort with palpation in the mid to upper abdomen. Extremities: No lower extremity edema. No clubbing or deformities. Neuro: Alert and oriented x 4   Skin: Warm and dry, no jaundice.   Psych: Alert and cooperative, normal mood and affect.

## 2018-04-19 NOTE — Patient Instructions (Signed)
1. Continue Benefiber every day. 2. Take one capful of Miralax every day. You can hold for a day or two if your stools are loose or frequent.  3. Call in a couple of weeks and let us know how you are doing. Otherwise we will see you back in six months.

## 2018-06-16 ENCOUNTER — Ambulatory Visit: Payer: Medicare Other | Admitting: Neurology

## 2018-07-08 ENCOUNTER — Telehealth: Payer: Self-pay | Admitting: Cardiology

## 2018-07-08 NOTE — Telephone Encounter (Signed)
New message     Virtual video visit scheduled for 07-13-18.  Pt gave verbal consent for visit.  YOUR CARDIOLOGY TEAM HAS ARRANGED FOR AN E-VISIT FOR YOUR APPOINTMENT - PLEASE REVIEW IMPORTANT INFORMATION BELOW SEVERAL DAYS PRIOR TO YOUR APPOINTMENT  Due to the recent COVID-19 pandemic, we are transitioning in-person office visits to tele-medicine visits in an effort to decrease unnecessary exposure to our patients, their families, and staff. These visits are billed to your insurance just like a normal visit is. We also encourage you to sign up for MyChart if you have not already done so. You will need a smartphone if possible. For patients that do not have this, we can still complete the visit using a regular telephone but do prefer a smartphone to enable video when possible. You may have a family member that lives with you that can help. If possible, we also ask that you have a blood pressure cuff and scale at home to measure your blood pressure, heart rate and weight prior to your scheduled appointment. Patients with clinical needs that need an in-person evaluation and testing will still be able to come to the office if absolutely necessary. If you have any questions, feel free to call our office.     YOUR PROVIDER WILL BE USING THE FOLLOWING PLATFORM TO COMPLETE YOUR VISIT: Doximity   IF USING MYCHART - How to Download the MyChart App to Your SmartPhone   - If Apple, go to [employer] and type in MyChart in the search bar and download the app. If Android, ask patient to go to [employer] and type in [place] in the search bar and download the app. The app is free but as with any other app downloads, your phone may require you to verify saved payment information or Apple/Android password.  - You will need to then log into the app with your MyChart username and password, and select [facility] as your healthcare provider to link the account.  - When it is time for your visit, go to the  MyChart app, find appointments, and click Begin Video Visit. Be sure to Select Allow for your device to access the Microphone and Camera for your visit. You will then be connected, and your provider will be with you shortly.  **If you have any issues connecting or need assistance, please contact MyChart service desk [phone])**  **If using a computer, in order to ensure the best quality for your visit, you will need to use either of the following Internet Browsers: Insurance underwriter or Microsoft Edge**   IF USING DOXIMITY or [email or web address] - The staff will give you instructions on receiving your link to join the meeting the day of your visit.      2-3 DAYS BEFORE YOUR APPOINTMENT  You will receive a telephone call from one of our [facility] team members - your caller ID may say "Unknown caller." If this is a video visit, we will walk you through how to get the video launched on your phone. We will remind you check your blood pressure, heart rate and weight prior to your scheduled appointment. If you have an Apple Watch or Kardia, please upload any pertinent ECG strips the day before or morning of your appointment to [employer]. Our staff will also make sure you have reviewed the consent and agree to move forward with your scheduled tele-health visit.     THE DAY OF YOUR APPOINTMENT  Approximately 15 minutes prior to your scheduled appointment,  you will receive a telephone call from one of [facility] team - your caller ID may say "Unknown caller."  Our staff will confirm medications, vital signs for the day and any symptoms you may be experiencing. Please have this information available prior to the time of visit start. It may also be helpful for you to have a pad of paper and pen handy for any instructions given during your visit. They will also walk you through joining the smartphone meeting if this is a video visit.    CONSENT FOR TELE-HEALTH VISIT - PLEASE REVIEW  I hereby voluntarily  request, consent and authorize [facility] and its employed or contracted physicians, physician assistants, nurse practitioners or other licensed health care professionals (the Practitioner), to provide me with telemedicine health care services (the Services") as deemed necessary by the treating Practitioner. I acknowledge and consent to receive the Services by the Practitioner via telemedicine. I understand that the telemedicine visit will involve communicating with the Practitioner through live audiovisual communication technology and the disclosure of certain medical information by electronic transmission. I acknowledge that I have been given the opportunity to request an in-person assessment or other available alternative prior to the telemedicine visit and am voluntarily participating in the telemedicine visit.  I understand that I have the right to withhold or withdraw my consent to the use of telemedicine in the course of my care at any time, without affecting my right to future care or treatment, and that the Practitioner or I may terminate the telemedicine visit at any time. I understand that I have the right to inspect all information obtained and/or recorded in the course of the telemedicine visit and may receive copies of available information for a reasonable fee.  I understand that some of the potential risks of receiving the Services via telemedicine include:   Delay or interruption in medical evaluation due to technological equipment failure or disruption;  Information transmitted may not be sufficient (e.g. poor resolution of images) to allow for appropriate medical decision making by the Practitioner; and/or   In rare instances, security protocols could fail, causing a breach of personal health information.  Furthermore, I acknowledge that it is my responsibility to provide information about my medical history, conditions and care that is complete and accurate to the best of my  ability. I acknowledge that Practitioner's advice, recommendations, and/or decision may be based on factors not within their control, such as incomplete or inaccurate data provided by me or distortions of diagnostic images or specimens that may result from electronic transmissions. I understand that the practice of medicine is not an exact science and that Practitioner makes no warranties or guarantees regarding treatment outcomes. I acknowledge that I will receive a copy of this consent concurrently upon execution via email to the email address I last provided but may also request a printed copy by calling the office of [facility].    I understand that my insurance will be billed for this visit.   I have read or had this consent read to me.  I understand the contents of this consent, which adequately explains the benefits and risks of the Services being provided via telemedicine.   I have been provided ample opportunity to ask questions regarding this consent and the Services and have had my questions answered to my satisfaction.  I give my informed consent for the services to be provided through the use of telemedicine in my medical care  By participating in this telemedicine visit  I agree to the above.

## 2018-07-13 ENCOUNTER — Other Ambulatory Visit: Payer: Self-pay

## 2018-07-13 ENCOUNTER — Encounter: Payer: Self-pay | Admitting: Cardiology

## 2018-07-13 ENCOUNTER — Telehealth (INDEPENDENT_AMBULATORY_CARE_PROVIDER_SITE_OTHER): Payer: Medicare Other | Admitting: Cardiology

## 2018-07-13 VITALS — BP 170/110 | HR 86 | Ht 61.0 in | Wt 128.0 lb

## 2018-07-13 DIAGNOSIS — R0789 Other chest pain: Secondary | ICD-10-CM

## 2018-07-13 DIAGNOSIS — I10 Essential (primary) hypertension: Secondary | ICD-10-CM | POA: Diagnosis not present

## 2018-07-13 DIAGNOSIS — I4510 Unspecified right bundle-branch block: Secondary | ICD-10-CM

## 2018-07-13 DIAGNOSIS — Z720 Tobacco use: Secondary | ICD-10-CM

## 2018-07-13 DIAGNOSIS — I493 Ventricular premature depolarization: Secondary | ICD-10-CM

## 2018-07-13 MED ORDER — LOSARTAN POTASSIUM-HCTZ 100-25 MG PO TABS: 1.0000 | ORAL_TABLET | Freq: Every day | ORAL | 3 refills | Status: DC

## 2018-07-13 NOTE — Patient Instructions (Signed)
Medication Instructions:  Your physician has recommended you make the following change in your medication: 1.  STOP the Losartan 2.  START Losartan-HCTXZ 100/25 mg taking 1 tablet daily  If you need a refill on your cardiac medications before your next appointment, please call your pharmacy.   Lab work: None ordered  If you have labs (blood work) drawn today and your tests are completely normal, you will receive your results only by: Marland Kitchen MyChart Message (if you have MyChart) OR . A paper copy in the mail If you have any lab test that is abnormal or we need to change your treatment, we will call you to review the results.  Testing/Procedures: None ordered  Follow-Up: At [facility], you and your health needs are our priority.  As part of our continuing mission to provide you with exceptional heart care, we have created designated Provider Care Teams.  These Care Teams include your primary Cardiologist (physician) and Advanced Practice Providers (APPs -  Physician Assistants and Nurse Practitioners) who all work together to provide you with the care you need, when you need it. You will need a follow up appointment in 12 months.  Please call our office 2 months in advance to schedule this appointment.  You may see Jenkins Rouge, MD or one of the following Advanced Practice Providers on your designated Care Team:   Truitt Merle, NP Cecilie Kicks, NP . Kathyrn Drown, NP  Any Other Special Instructions Will Be Listed Below (If Applicable). It is ok to take the Chantix [place] to wear a mask and wash your hands

## 2018-07-13 NOTE — Progress Notes (Signed)
Virtual Visit via Video Note   This visit type was conducted due to national recommendations for restrictions regarding the COVID-19 Pandemic (e.g. social distancing) in an effort to limit this patient's exposure and mitigate transmission in our community.  Due to her co-morbid illnesses, this patient is at least at moderate risk for complications without adequate follow up.  This format is felt to be most appropriate for this patient at this time.  All issues noted in this document were discussed and addressed.  A limited physical exam was performed with this format.  Please refer to the patient's chart for her consent to telehealth for [facility].   Evaluation Performed:  Follow-up visit  Date:  07/13/2018   ID:  Kimmora, Risenhoover [birth year], MRN [id number]  Patient Location: Home Provider Location: Office  PCP:  Sharilyn Sites, MD  Cardiologist:  Jenkins Rouge, MD  Electrophysiologist:  None   Chief Complaint:  PVCs   History of Present Illness:    Emily Valentine is a 68 y.o. female with hx of PVCs, RBBB atypical chest pain and prior pt of Dr. Mare Ferrari.    She had been seen on 10/04/13 for complaints of chest tightness and exertional dyspnea. She underwent a echocardiogram as well as a treadmill Myoview, both studies on 10/16/13. The Myoview showed no evidence of ischemia and her ejection fraction was 56%. The echocardiogram showed an ejection fraction of 65-70% with vigorous contraction and no significant valve abnormalities.since last visit she has been on low dose Toprol 25 mg daily and has felt better. Her PVCs have diminished.  She is still smoking about a pack of cigarettes a day against advice.  Last seen by Dr. Johnsie Cancel 03/23/17.    Today she is doing well.  No colds or fevers or cough.  She has complained of some chest discomfort on climbing stairs, but today her BP is very elevated at 170/110.  Her HCTZ had been stopped.  We will resume.  She continues to smoke but will  try Chantix to see if she can stop.  Given to her by PCP.  I agreed with his recommendation.  No complaints of palpitations.  She tries to do some walking but admits not doing much.  She does wear a mask when she goes out.   No SOB.   The patient does not have symptoms concerning for COVID-19 infection (fever, chills, cough, or new shortness of breath).    Past Medical History:  Diagnosis Date  . History of echocardiogram    Echo (8/15): Vigorous LV function, EF 65-70%, normal wall motion, grade 1 diastolic dysfunction, mild to moderate TR, PASP 31 mm Hg  . Hx of cardiovascular stress test    ETT-Myoview (8/15): no ischemia, EF 56%, normal study  . Hypertension   . Hypothyroid   . Pernicious anemia   . S/P colonoscopy 2001, 2004   2001: hyperplastic polyp, 2004: normal, inflammatory polyp   Past Surgical History:  Procedure Laterality Date  . ABDOMINAL HYSTERECTOMY    . BACK SURGERY    . BREAST BIOPSY Right 10/29/2010  . BREAST BIOPSY Right 11/27/2014  . BREAST EXCISIONAL BIOPSY Right 02/21/2015  . BREAST LUMPECTOMY WITH RADIOACTIVE SEED LOCALIZATION Right 02/21/2015   Procedure: RIGHT BREAST LUMPECTOMY WITH RADIOACTIVE SEED LOCALIZATION;  Surgeon: Rolm Bookbinder, MD;  Location: [facility];  Service: General;  Laterality: Right;  . COLONOSCOPY  11/10/2010   Procedure: COLONOSCOPY;  Surgeon: Daneil Dolin, MD;  [facility];  Service: Endoscopy;  Laterality: N/A;  . COLONOSCOPY N/A 09/28/2016   Dr. Emerson Monte: Diverticulosis with evidence of recent diverticulitis.  Next colonoscopy in 2023 given history of tubular adenomas in the past.  . THYROIDECTOMY       Current Meds  Medication Sig  . acetaminophen (TYLENOL) 650 MG CR tablet Take 650 mg by mouth 2 (two) times daily as needed for pain.  Marland Kitchen aspirin EC 81 MG tablet Take 1 tablet (81 mg total) by mouth daily.  . Bacillus Coagulans-Inulin (PROBIOTIC) 1-250 BILLION-MG CAPS Take by mouth. Take 1 cap per day  .  Cholecalciferol (VITAMIN D3) 2000 units capsule Take 2,000 Units by mouth daily at 12 noon.  . Cyanocobalamin (VITAMIN B-12 IJ) Inject 1,000 mcg as directed every 30 (thirty) days.   . folic acid (FOLVITE) 1 MG tablet Take 1 mg by mouth daily.  . hydrocortisone cream 1 % Apply 1 application topically daily as needed for itching.  . levothyroxine (SYNTHROID, LEVOTHROID) 88 MCG tablet Take 88 mcg by mouth daily before breakfast.   . methotrexate 2.5 MG tablet Take 2.5 mg by mouth once a week. Take 10 tabs once per week - 5 tabs am, 5 tabs pm  . metoprolol succinate (TOPROL-XL) 25 MG 24 hr tablet TAKE (1) TABLET BY MOUTH DAILY.  . Multiple Vitamins-Minerals (MULTIVITAMIN WITH MINERALS) tablet Take 1 tablet by mouth daily at 12 noon.   . nitroGLYCERIN (NITROSTAT) 0.4 MG SL tablet Place 1 tablet (0.4 mg total) under the tongue every 5 (five) minutes as needed for chest pain.  . polyethylene glycol (MIRALAX / GLYCOLAX) packet Take 17 g by mouth daily as needed for mild constipation.  Marland Kitchen PROAIR HFA 108 (90 Base) MCG/ACT inhaler Inhale 108 mcg into the lungs as needed.  . traMADol (ULTRAM) 50 MG tablet Take 50 mg by mouth as needed.  . Wheat Dextrin (BENEFIBER) POWD Take 1 Dose by mouth 2 (two) times daily.  . [DISCONTINUED] losartan (COZAAR) 100 MG tablet Take 100 mg by mouth daily.     Allergies:   Codeine and Oxycontin [oxycodone]   Social History   Tobacco Use  . Smoking status: Current Every Day Smoker    Packs/day: 1.00    Years: 20.00    Pack years: 20.00    Types: Cigarettes  . Smokeless tobacco: Former Network engineer Use Topics  . Alcohol use: No  . Drug use: No     Family Hx: The patient's family history includes Anemia in her father, mother, and sister; Cancer in her brother, father, and mother; Colon cancer in her father; Diabetes in her sister; Hypertension in her brother, father, mother, and sister; Stomach cancer in her mother; Thyroid disease in her brother, mother, and  sister.  ROS:   Please see the history of present illness.    General:no colds or fevers, no weight changes Skin:no rashes or ulcers HEENT:no blurred vision, no congestion CV:see HPI PUL:see HPI GI:no diarrhea constipation or melena, no indigestion GU:no hematuria, no dysuria MS:no joint pain, no claudication Neuro:no syncope, no lightheadedness Endo:no diabetes, + thyroid disease  All other systems reviewed and are negative.   Prior CV studies:   The following studies were reviewed today: History of echocardiogram     Echo (8/15): Vigorous LV function, EF 65-70%, normal wall motion, grade 1 diastolic dysfunction, mild to moderate TR, PASP 31 mm Hg   Hx of cardiovascular stress test     ETT-Myoview (8/15): no ischemia, EF 56%, normal study  Labs/Other Tests and Data Reviewed:    EKG:  An ECG dated 03/23/17 was personally reviewed today and demonstrated:  RBBB, LAD, rt atrial enlargement no acute changes.  Recent Labs: No results found for requested labs within last 8760 hours.   Recent Lipid Panel Lab Results  Component Value Date/Time   CHOL 151 10/16/2013 10:23 AM   TRIG 116.0 10/16/2013 10:23 AM   HDL 37.50 (L) 10/16/2013 10:23 AM   CHOLHDL 4 10/16/2013 10:23 AM   LDLCALC 90 10/16/2013 10:23 AM    Wt Readings from Last 3 Encounters:  07/13/18 128 lb (58.1 kg)  04/19/18 126 lb 9.6 oz (57.4 kg)  01/11/18 128 lb 6.4 oz (58.2 kg)     Objective:    Vital Signs:  BP (!) 170/110   Pulse 86   Ht 5\' 1"  (1.549 m)   Wt 128 lb (58.1 kg)   BMI 24.19 kg/m    VITAL SIGNS:  reviewed  General female in no acute distress, her husband was with her for the visit. Neuro alert and oreinted X 3 MAE follows commands. Lungs: can complete full sentences without stopping for SOB Psych:  Pleasant affect  ASSESSMENT & PLAN:    1. HTN elevated will add back HCTZ 25 mg daily she will call in BP readings next week. 2. Chest pressure with climbing steps, could be due to  BP alone with neg nuc in past.  will see if resolves with BP control if not then would proceed with cardiac CTA - she does have CAD on her CTA of her chest in 2019.  Also at that time mild pericardial effusion  3. PVCs improved on BB no change 4. Tobacco use, she will attempt stopping on chantix  5. RBBB, no EKG today due to COVID restrictions.   COVID-19 Education: The signs and symptoms of COVID-19 were discussed with the patient and how to seek care for testing (follow up with PCP or arrange E-visit).  The importance of social distancing was discussed today.  Time:   Today, I have spent 20 minutes with the patient with telehealth technology discussing the above problems.     Medication Adjustments/Labs and Tests Ordered: Current medicines are reviewed at length with the patient today.  Concerns regarding medicines are outlined above.   Tests Ordered: No orders of the defined types were placed in this encounter.   Medication Changes: Meds ordered this encounter  Medications  . losartan-hydrochlorothiazide (HYZAAR) 100-25 MG tablet    Sig: Take 1 tablet by mouth daily.    Dispense:  90 tablet    Refill:  3    Disposition:  Follow up in 2 week(s) with Cecilie Kicks and 6 months with Dr. Johnsie Cancel.  Signed, Cecilie Kicks, NP  07/13/2018 6:07 PM    [place]

## 2018-07-14 ENCOUNTER — Other Ambulatory Visit: Payer: Self-pay

## 2018-07-14 ENCOUNTER — Telehealth: Payer: Self-pay | Admitting: Cardiology

## 2018-07-14 MED ORDER — LOSARTAN POTASSIUM-HCTZ 100-25 MG PO TABS: 1.0000 | ORAL_TABLET | Freq: Every day | ORAL | 3 refills | Status: DC

## 2018-07-14 NOTE — Telephone Encounter (Signed)
New Message            Patient is calling because the Rx that was called in was wrong, "Losartin" and she is suppose to have the "losartin w/ the combination 100/25 mg. Patient is needing a new Rx sent to [facility]. Pls call patient to advise.

## 2018-07-20 ENCOUNTER — Telehealth: Payer: Medicare Other | Admitting: Cardiology

## 2018-07-22 NOTE — Progress Notes (Signed)
Virtual Visit via Video Note   This visit type was conducted due to national recommendations for restrictions regarding the COVID-19 Pandemic (e.g. social distancing) in an effort to limit this patient's exposure and mitigate transmission in our community.  Due to her co-morbid illnesses, this patient is at least at moderate risk for complications without adequate follow up.  This format is felt to be most appropriate for this patient at this time.  All issues noted in this document were discussed and addressed.  A limited physical exam was performed with this format.  Please refer to the patient's chart for her consent to telehealth for [facility].   Date:  07/25/2018   ID:  Emily Valentine May 31, 1950, MRN [id number]  Patient Location: Home Provider Location: Office  PCP:  Sharilyn Sites, MD  Cardiologist:  Jenkins Rouge, MD  Electrophysiologist:  None   Evaluation Performed:  Follow-Up Visit  Chief Complaint:  Hypertension    History of Present Illness:    Emily Valentine is a 68 y.o. female with  hx of PVCs, RBBB atypical chest pain and prior pt of Dr. Mare Ferrari.   She had been seen on 10/04/13 for complaints of chest tightness and exertional dyspnea. She underwent a echocardiogram as well as a treadmill Myoview, both studies on 10/16/13. The Myoview showed no evidence of ischemia and her ejection fraction was 56%. The echocardiogram showed an ejection fraction of 65-70% with vigorous contraction and no significant valve abnormalities.since last visit she has been on low dose Toprol 25 mg daily and has felt better. Her PVCs have diminished.  She is still smoking about a pack of cigarettes a day against advice.  Last seen by Dr. Johnsie Cancel 03/23/17.    Last telehealth visit she is doing well.  No colds or fevers or cough.  She has complained of some chest discomfort on climbing stairs, but today her BP is very elevated at 170/110.  Her HCTZ had been stopped.  We will resume.  She  continues to smoke but will try Chantix to see if she can stop.  Given to her by PCP.  I agreed with his recommendation.  No complaints of palpitations.  She tries to do some walking but admits not doing much.  She does wear a mask when she goes out.   No SOB.   I added back HCTZ and will monitor her chest pain.  She is back today to re-eval pain with BP control.   Her BP has improved.  She has not had any furthers chest tightness.  She even moved clothes from her room to basement and moved others back without any chest tightness.  She was unable to get combination pill so taking HCTZ and losartan.   She will continue   Not going out and no symptoms of COVID.  She has allergy symptoms in early AM and at night.   She has not started Chantix, is anxious about taking, smokes 3 cigarettes per day she will attempt to decrease to 1.   The patient does not have symptoms concerning for COVID-19 infection (fever, chills, cough, or new shortness of breath).    Past Medical History:  Diagnosis Date  . History of echocardiogram    Echo (8/15): Vigorous LV function, EF 65-70%, normal wall motion, grade 1 diastolic dysfunction, mild to moderate TR, PASP 31 mm Hg  . Hx of cardiovascular stress test    ETT-Myoview (8/15): no ischemia, EF 56%, normal study  . Hypertension   .  Hypothyroid   . Pernicious anemia   . S/P colonoscopy 2001, 2004   2001: hyperplastic polyp, 2004: normal, inflammatory polyp   Past Surgical History:  Procedure Laterality Date  . ABDOMINAL HYSTERECTOMY    . BACK SURGERY    . BREAST BIOPSY Right 10/29/2010  . BREAST BIOPSY Right 11/27/2014  . BREAST EXCISIONAL BIOPSY Right 02/21/2015  . BREAST LUMPECTOMY WITH RADIOACTIVE SEED LOCALIZATION Right 02/21/2015   Procedure: RIGHT BREAST LUMPECTOMY WITH RADIOACTIVE SEED LOCALIZATION;  Surgeon: Rolm Bookbinder, MD;  Location: [place];  Service: General;  Laterality: Right;  . COLONOSCOPY  11/10/2010   Procedure:  COLONOSCOPY;  Surgeon: Daneil Dolin, MD;  [facility];  Service: Endoscopy;  Laterality: N/A;  . COLONOSCOPY N/A 09/28/2016   Dr. Emerson Monte: Diverticulosis with evidence of recent diverticulitis.  Next colonoscopy in 2023 given history of tubular adenomas in the past.  . THYROIDECTOMY       Current Meds  Medication Sig  . acetaminophen (TYLENOL) 650 MG CR tablet Take 650 mg by mouth 2 (two) times daily as needed for pain.  Marland Kitchen aspirin EC 81 MG tablet Take 1 tablet (81 mg total) by mouth daily.  . Bacillus Coagulans-Inulin (PROBIOTIC) 1-250 BILLION-MG CAPS Take by mouth. Take 1 cap per day  . Cholecalciferol (VITAMIN D3) 2000 units capsule Take 2,000 Units by mouth daily at 12 noon.  . Cyanocobalamin (VITAMIN B-12 IJ) Inject 1,000 mcg as directed every 30 (thirty) days.   . folic acid (FOLVITE) 1 MG tablet Take 1 mg by mouth daily.  . hydrocortisone cream 1 % Apply 1 application topically daily as needed for itching.  . levothyroxine (SYNTHROID, LEVOTHROID) 88 MCG tablet Take 88 mcg by mouth daily before breakfast.   . losartan-hydrochlorothiazide (HYZAAR) 100-25 MG tablet Take 1 tablet by mouth daily.  . methotrexate 2.5 MG tablet Take 2.5 mg by mouth once a week. Take 10 tabs once per week - 5 tabs am, 5 tabs pm  . metoprolol succinate (TOPROL-XL) 25 MG 24 hr tablet TAKE (1) TABLET BY MOUTH DAILY.  . Multiple Vitamins-Minerals (MULTIVITAMIN WITH MINERALS) tablet Take 1 tablet by mouth daily at 12 noon.   . nitroGLYCERIN (NITROSTAT) 0.4 MG SL tablet Place 1 tablet (0.4 mg total) under the tongue every 5 (five) minutes as needed for chest pain.  . polyethylene glycol (MIRALAX / GLYCOLAX) packet Take 17 g by mouth daily as needed for mild constipation.  Marland Kitchen PROAIR HFA 108 (90 Base) MCG/ACT inhaler Inhale 108 mcg into the lungs as needed.  . traMADol (ULTRAM) 50 MG tablet Take 50 mg by mouth as needed.  . Wheat Dextrin (BENEFIBER) POWD Take 1 Dose by mouth 2 (two) times daily.      Allergies:   Codeine and Oxycontin [oxycodone]   Social History   Tobacco Use  . Smoking status: Current Every Day Smoker    Packs/day: 1.00    Years: 20.00    Pack years: 20.00    Types: Cigarettes  . Smokeless tobacco: Former Network engineer Use Topics  . Alcohol use: No  . Drug use: No     Family Hx: The patient's family history includes Anemia in her father, mother, and sister; Cancer in her brother, father, and mother; Colon cancer in her father; Diabetes in her sister; Hypertension in her brother, father, mother, and sister; Stomach cancer in her mother; Thyroid disease in her brother, mother, and sister.  ROS:   Please see the history of present illness.  General:no colds or fevers, no weight changes Skin:no rashes or ulcers HEENT:no blurred vision, no congestion CV:see HPI PUL:see HPI GI:no diarrhea constipation or melena, no indigestion GU:no hematuria, no dysuria MS:no joint pain, no claudication Neuro:no syncope, no lightheadedness Endo:no diabetes, + thyroid disease  All other systems reviewed and are negative.   Prior CV studies:   The following studies were reviewed today:  History of echocardiogram     Echo (8/15): Vigorous LV function, EF 65-70%, normal wall motion, grade 1 diastolic dysfunction, mild to moderate TR, PASP 31 mm Hg   Hx of cardiovascular stress test     ETT-Myoview (8/15): no ischemia, EF 56%, normal study     Labs/Other Tests and Data Reviewed:    EKG:  An ECG dated 03/23/17 was personally reviewed today and demonstrated:  RBBB, LAD rt atrial enlargement and no acute changes.  Recent Labs: No results found for requested labs within last 8760 hours.   Recent Lipid Panel Lab Results  Component Value Date/Time   CHOL 151 10/16/2013 10:23 AM   TRIG 116.0 10/16/2013 10:23 AM   HDL 37.50 (L) 10/16/2013 10:23 AM   CHOLHDL 4 10/16/2013 10:23 AM   LDLCALC 90 10/16/2013 10:23 AM    Wt Readings from Last 3 Encounters:   07/13/18 128 lb (58.1 kg)  04/19/18 126 lb 9.6 oz (57.4 kg)  01/11/18 128 lb 6.4 oz (58.2 kg)     Objective:    Vital Signs:  BP (!) 129/92   Pulse 72   Ht 5\' 1"  (1.549 m)   BMI 24.19 kg/m    VITAL SIGNS:  reviewed  General female in NAD Neuro: A&O X3, follows commands Lungs no acute SOB Psych:  Pleasant affect   ASSESSMENT & PLAN:    1. HTN uncontrolled now improved adding HCTZ back.  Labs next week.  2. Chest pain with exertion now resolved with improved BP control.  If returns she will call out office.  3. Tobacco use, she is trying to stop.  COVID-19 Education: The signs and symptoms of COVID-19 were discussed with the patient and how to seek care for testing (follow up with PCP or arrange E-visit).  The importance of social distancing was discussed today.  Time:   Today, I have spent 10 minutes with the patient with telehealth technology discussing the above problems.     Medication Adjustments/Labs and Tests Ordered: Current medicines are reviewed at length with the patient today.  Concerns regarding medicines are outlined above.   Tests Ordered: No orders of the defined types were placed in this encounter.   Medication Changes: No orders of the defined types were placed in this encounter.   Disposition:  Follow up in 4 month(s)  Signed, Cecilie Kicks, NP  07/25/2018 11:13 AM    [place]

## 2018-07-25 ENCOUNTER — Telehealth (INDEPENDENT_AMBULATORY_CARE_PROVIDER_SITE_OTHER): Payer: Medicare Other | Admitting: Cardiology

## 2018-07-25 ENCOUNTER — Encounter: Payer: Self-pay | Admitting: Cardiology

## 2018-07-25 ENCOUNTER — Other Ambulatory Visit: Payer: Self-pay

## 2018-07-25 VITALS — BP 129/92 | HR 72 | Ht 61.0 in

## 2018-07-25 DIAGNOSIS — I10 Essential (primary) hypertension: Secondary | ICD-10-CM

## 2018-07-25 DIAGNOSIS — Z79899 Other long term (current) drug therapy: Secondary | ICD-10-CM | POA: Diagnosis not present

## 2018-07-25 DIAGNOSIS — R0789 Other chest pain: Secondary | ICD-10-CM

## 2018-07-25 DIAGNOSIS — Z720 Tobacco use: Secondary | ICD-10-CM

## 2018-07-25 NOTE — Patient Instructions (Addendum)
Medication Instructions:  Your physician recommends that you continue on your current medications as directed. Please refer to the Current Medication list given to you today.  If you need a refill on your cardiac medications before your next appointment, please call your pharmacy.   Lab work: 1 WEEK:  COME TO THE OFFICE 5/182020 ANYTIME THAT MORNING FOR LAB WORK  If you have labs (blood work) drawn today and your tests are completely normal, you will receive your results only by: Marland Kitchen MyChart Message (if you have MyChart) OR . A paper copy in the mail If you have any lab test that is abnormal or we need to change your treatment, we will call you to review the results.  Testing/Procedures: None ordered   Follow-Up: At [facility], you and your health needs are our priority.  As part of our continuing mission to provide you with exceptional heart care, we have created designated Provider Care Teams.  These Care Teams include your primary Cardiologist (physician) and Advanced Practice Providers (APPs -  Physician Assistants and Nurse Practitioners) who all work together to provide you with the care you need, when you need it. You will need a follow up appointment in 4 months.  Please call our office 2 months in advance to schedule this appointment.  You may see Jenkins Rouge, MD or one of the following Advanced Practice Providers on your designated Care Team:   Truitt Merle, NP Cecilie Kicks, NP . Kathyrn Drown, NP  Any Other Special Instructions Will Be Listed Below (If Applicable). KEEP A CHECK ON YOUR BLOOD PRESSURE DAILY.  CALL 08/01/2018 INTO THE OFFICE AND GIVE IT TO SOMEONE IN TRIAGE

## 2018-08-01 ENCOUNTER — Other Ambulatory Visit: Payer: Medicare Other | Admitting: *Deleted

## 2018-08-01 ENCOUNTER — Other Ambulatory Visit: Payer: Self-pay

## 2018-08-01 DIAGNOSIS — Z79899 Other long term (current) drug therapy: Secondary | ICD-10-CM

## 2018-08-01 LAB — BASIC METABOLIC PANEL
BUN/Creatinine Ratio: 20 (ref 12–28)
BUN: 23 mg/dL (ref 8–27)
CO2: 26 mmol/L (ref 20–29)
Calcium: 10 mg/dL (ref 8.7–10.3)
Chloride: 102 mmol/L (ref 96–106)
Creatinine, Ser: 1.13 mg/dL — ABNORMAL HIGH (ref 0.57–1.00)
GFR calc Af Amer: 58 mL/min/{1.73_m2} — ABNORMAL LOW (ref >59)
GFR calc non Af Amer: 50 mL/min/{1.73_m2} — ABNORMAL LOW (ref >59)
Glucose: 69 mg/dL (ref 65–99)
Potassium: 3.7 mmol/L (ref 3.5–5.2)
Sodium: 141 mmol/L (ref 134–144)

## 2018-10-19 ENCOUNTER — Encounter: Payer: Self-pay | Admitting: Internal Medicine

## 2018-10-27 ENCOUNTER — Other Ambulatory Visit (HOSPITAL_COMMUNITY): Payer: Self-pay | Admitting: Family Medicine

## 2018-10-27 DIAGNOSIS — Z1231 Encounter for screening mammogram for malignant neoplasm of breast: Secondary | ICD-10-CM

## 2018-11-16 ENCOUNTER — Other Ambulatory Visit: Payer: Self-pay

## 2018-11-16 ENCOUNTER — Ambulatory Visit (HOSPITAL_COMMUNITY)
Admission: RE | Admit: 2018-11-16 | Discharge: 2018-11-16 | Disposition: A | Discharge status: Home / Self Care | Payer: Medicare Other | Source: Ambulatory Visit | Attending: Family Medicine | Admitting: Family Medicine

## 2018-11-16 DIAGNOSIS — Z1231 Encounter for screening mammogram for malignant neoplasm of breast: Secondary | ICD-10-CM | POA: Diagnosis not present

## 2018-11-25 ENCOUNTER — Ambulatory Visit: Payer: Medicare Other | Admitting: Internal Medicine

## 2018-11-25 ENCOUNTER — Other Ambulatory Visit: Payer: Self-pay

## 2018-11-25 ENCOUNTER — Encounter: Payer: Self-pay | Admitting: Internal Medicine

## 2018-11-25 VITALS — BP 159/101 | HR 80 | Temp 98.2°F | Ht 62.0 in | Wt 128.0 lb

## 2018-11-25 DIAGNOSIS — K5900 Constipation, unspecified: Secondary | ICD-10-CM | POA: Diagnosis not present

## 2018-11-25 NOTE — Patient Instructions (Addendum)
Take Benefiber 2 tablespoons daily everyday without fail  Take miralax 1 capful nightly as needed for constipation  OV 1 year  Colonoscopy in 2023

## 2018-11-25 NOTE — Progress Notes (Signed)
Primary Care Physician:  Sharilyn Sites, MD Primary Gastroenterologist:  Dr. Gala Romney  Pre-Procedure History & Physical: HPI:  Emily Valentine is a 68 y.o. female here for follow-up of constipation history of diverticulitis and colonic polyp.  Takes Benefiber and MiraLAX on a as needed basis for constipation.  Has a bowel movement nearly every day on demand use of fiber and MiraLAX.  No rectal bleeding or abdominal pain.  Past Medical History:  Diagnosis Date  . History of echocardiogram    Echo (8/15): Vigorous LV function, EF 65-70%, normal wall motion, grade 1 diastolic dysfunction, mild to moderate TR, PASP 31 mm Hg  . Hx of cardiovascular stress test    ETT-Myoview (8/15): no ischemia, EF 56%, normal study  . Hypertension   . Hypothyroid   . Pernicious anemia   . S/P colonoscopy 2001, 2004   2001: hyperplastic polyp, 2004: normal, inflammatory polyp    Past Surgical History:  Procedure Laterality Date  . ABDOMINAL HYSTERECTOMY    . BACK SURGERY    . BREAST BIOPSY Right 10/29/2010  . BREAST BIOPSY Right 11/27/2014  . BREAST EXCISIONAL BIOPSY Right 02/21/2015  . BREAST LUMPECTOMY WITH RADIOACTIVE SEED LOCALIZATION Right 02/21/2015   Procedure: RIGHT BREAST LUMPECTOMY WITH RADIOACTIVE SEED LOCALIZATION;  Surgeon: Rolm Bookbinder, MD;  Location: [place];  Service: General;  Laterality: Right;  . COLONOSCOPY  11/10/2010   Procedure: COLONOSCOPY;  Surgeon: Daneil Dolin, MD;  [facility];  Service: Endoscopy;  Laterality: N/A;  . COLONOSCOPY N/A 09/28/2016   Dr. Emerson Monte: Diverticulosis with evidence of recent diverticulitis.  Next colonoscopy in 2023 given history of tubular adenomas in the past.  . THYROIDECTOMY      Prior to Admission medications   Medication Sig Start Date End Date Taking? Authorizing Provider  acetaminophen (TYLENOL) 650 MG CR tablet Take 650 mg by mouth 2 (two) times daily as needed for pain.   Yes [provider]   aspirin EC 81 MG tablet Take 1 tablet (81 mg total) by mouth daily. 10/04/13  Yes Richardson Dopp T, PA-C  Cholecalciferol (VITAMIN D3) 2000 units capsule Take 2,000 Units by mouth daily at 12 noon.   Yes [provider]  Cyanocobalamin (VITAMIN B-12 IJ) Inject 1,000 mcg as directed every 30 (thirty) days.    Yes [provider]  folic acid (FOLVITE) 1 MG tablet Take 1 mg by mouth daily.   Yes [provider]  hydrocortisone cream 1 % Apply 1 application topically daily as needed for itching.   Yes [provider]  levothyroxine (SYNTHROID, LEVOTHROID) 88 MCG tablet Take 88 mcg by mouth daily before breakfast.    Yes [provider]  losartan-hydrochlorothiazide (HYZAAR) 100-25 MG tablet Take 1 tablet by mouth daily. 07/14/18  Yes Isaiah Serge, NP  methotrexate 2.5 MG tablet Take 2.5 mg by mouth once a week. Take 7 tabs once per week 06/09/18  Yes [provider]  metoprolol succinate (TOPROL-XL) 25 MG 24 hr tablet TAKE (1) TABLET BY MOUTH DAILY. 04/12/18  Yes Josue Hector, MD  Multiple Vitamins-Minerals (MULTIVITAMIN WITH MINERALS) tablet Take 1 tablet by mouth daily at 12 noon.    Yes [provider]  nitroGLYCERIN (NITROSTAT) 0.4 MG SL tablet Place 1 tablet (0.4 mg total) under the tongue every 5 (five) minutes as needed for chest pain. 09/18/15  Yes Josue Hector, MD  polyethylene glycol (MIRALAX / GLYCOLAX) packet Take 17 g by mouth daily as  needed for mild constipation.   Yes [provider]  PROAIR HFA 108 (90 Base) MCG/ACT inhaler Inhale 108 mcg into the lungs as needed. 03/26/18  Yes [provider]  traMADol (ULTRAM) 50 MG tablet Take 50 mg by mouth as needed. 04/11/18  Yes [provider]  Wheat Dextrin (BENEFIBER) POWD Take 1 Dose by mouth as needed.    Yes [provider]    Allergies as of 11/25/2018 - Review Complete 11/25/2018  Allergen Reaction Noted  . Codeine Nausea Only 08/24/2016   . Oxycontin [oxycodone] Nausea And Vomiting 09/01/2011    Family History  Problem Relation Age of Onset  . Stomach cancer Mother        deceased  . Anemia Mother   . Cancer Mother   . Hypertension Mother   . Thyroid disease Mother   . Colon cancer Father        diagnosed age 91, died at age 44  . Anemia Father   . Cancer Father   . Hypertension Father   . Anemia Sister   . Cancer Brother   . Diabetes Sister   . Hypertension Sister   . Hypertension Brother   . Thyroid disease Brother   . Thyroid disease Sister     Social History   Socioeconomic History  . Marital status: Married    Spouse name: Not on file  . Number of children: Not on file  . Years of education: Not on file  . Highest education level: Not on file  Occupational History  . Not on file  Social Needs  . Financial resource strain: Not on file  . Food insecurity    Worry: Not on file    Inability: Not on file  . Transportation needs    Medical: Not on file    Non-medical: Not on file  Tobacco Use  . Smoking status: Current Every Day Smoker    Packs/day: 1.00    Years: 20.00    Pack years: 20.00    Types: Cigarettes  . Smokeless tobacco: Former Network engineer and Sexual Activity  . Alcohol use: No  . Drug use: No  . Sexual activity: Yes    Birth control/protection: Surgical  Lifestyle  . Physical activity    Days per week: Not on file    Minutes per session: Not on file  . Stress: Not on file  Relationships  . Social Herbalist on phone: Not on file    Gets together: Not on file    Attends religious service: Not on file    Active member of club or organization: Not on file    Attends meetings of clubs or organizations: Not on file    Relationship status: Not on file  . Intimate partner violence    Fear of current or ex partner: Not on file    Emotionally abused: Not on file    Physically abused: Not on file    Forced sexual activity: Not on file  Other Topics Concern  .  Not on file  Social History Narrative  . Not on file    Review of Systems: See HPI, otherwise negative ROS  Physical Exam: BP (!) 159/101   Pulse 80   Temp 98.2 F (36.8 C) (Temporal)   Ht 5\' 2"  (1.575 m)   Wt 128 lb (58.1 kg)   BMI 23.41 kg/m  General:   Alert,  Well-developed, well-nourished, pleasant and cooperative in NAD Mouth:  No  deformity or lesions. Neck:  Supple; no masses or thyromegaly. No significant cervical adenopathy. Lungs:  Clear throughout to auscultation.   No wheezes, crackles, or rhonchi. No acute distress. Heart:  Regular rate and rhythm; no murmurs, clicks, rubs,  or gallops. Abdomen: Non-distended, normal bowel sounds.  Soft and nontender without appreciable mass or hepatosplenomegaly.  Pulses:  Normal pulses noted. Extremities:  Without clubbing or edema.  Impression:   67 year old lady with a history of chronic constipation, diverticulosis/diverticulitis.  Taking fiber and MiraLAX on a on demand basis to manage her symptoms.  Feels she would do better to take Benefiber 2 tablespoons daily every day with MiraLAX nightly on a as needed basis for constipation.  She will be due for screening colonoscopy 2023  Recommendations:  Take Benefiber 2 tablespoons daily everyday without fail  Take miralax 1 capful nightly as needed for constipation  OV 1 year  Colonoscopy in 2023    Notice: This dictation was prepared with Dragon dictation along with smaller phrase technology. Any transcriptional errors that result from this process are unintentional and may not be corrected upon review.

## 2019-04-10 ENCOUNTER — Other Ambulatory Visit: Payer: Self-pay | Admitting: Cardiology

## 2019-04-23 ENCOUNTER — Ambulatory Visit: Payer: Medicare PPO | Attending: Internal Medicine

## 2019-04-23 ENCOUNTER — Other Ambulatory Visit: Payer: Self-pay

## 2019-04-23 DIAGNOSIS — Z23 Encounter for immunization: Secondary | ICD-10-CM | POA: Insufficient documentation

## 2019-04-23 NOTE — Progress Notes (Signed)
   Covid-19 Vaccination Clinic  Name:  Emily Valentine    MRN: [id number] DOB: 1950-06-10  04/23/2019  Ms. Hoffer was observed post Covid-19 immunization for 15 minutes without incidence. She was provided with Vaccine Information Sheet and instruction to access the V-Safe system.   Ms. Doke was instructed to call 911 with any severe reactions post vaccine: Marland Kitchen Difficulty breathing  . Swelling of your face and throat  . A fast heartbeat  . A bad rash all over your body  . Dizziness and weakness    Immunizations Administered    Name Date Dose VIS Date Route   Moderna COVID-19 Vaccine 04/23/2019  1:10 PM 0.5 mL 02/14/2019 Intramuscular   Manufacturer: Moderna   Lot: [id number]   [place]:[id number]

## 2019-05-24 ENCOUNTER — Ambulatory Visit: Payer: Medicare PPO | Attending: Internal Medicine

## 2019-05-24 DIAGNOSIS — Z23 Encounter for immunization: Secondary | ICD-10-CM

## 2019-05-24 NOTE — Progress Notes (Signed)
   Covid-19 Vaccination Clinic  Name:  Emily Valentine    MRN: [id number] DOB: 11-Dec-1950  05/24/2019  [name] was observed post Covid-19 immunization for 30 minutes based on pre-vaccination screening without incident. She was provided with Vaccine Information Sheet and instruction to access the V-Safe system.   [name] was instructed to call 911 with any severe reactions post vaccine: Marland Kitchen Difficulty breathing  . Swelling of face and throat  . A fast heartbeat  . A bad rash all over body  . Dizziness and weakness   Immunizations Administered    Name Date Dose VIS Date Route   Moderna COVID-19 Vaccine 05/24/2019  2:46 PM 0.5 mL 02/14/2019 Intramuscular   Manufacturer: Moderna   Lot: [id number]   [id number]

## 2019-05-29 DIAGNOSIS — Z79899 Other long term (current) drug therapy: Secondary | ICD-10-CM | POA: Diagnosis not present

## 2019-05-29 DIAGNOSIS — M25519 Pain in unspecified shoulder: Secondary | ICD-10-CM | POA: Diagnosis not present

## 2019-05-29 DIAGNOSIS — M25539 Pain in unspecified wrist: Secondary | ICD-10-CM | POA: Diagnosis not present

## 2019-05-29 DIAGNOSIS — M8580 Other specified disorders of bone density and structure, unspecified site: Secondary | ICD-10-CM | POA: Diagnosis not present

## 2019-05-29 DIAGNOSIS — M1990 Unspecified osteoarthritis, unspecified site: Secondary | ICD-10-CM | POA: Diagnosis not present

## 2019-05-29 DIAGNOSIS — M79643 Pain in unspecified hand: Secondary | ICD-10-CM | POA: Diagnosis not present

## 2019-05-29 DIAGNOSIS — M25529 Pain in unspecified elbow: Secondary | ICD-10-CM | POA: Diagnosis not present

## 2019-05-29 DIAGNOSIS — M059 Rheumatoid arthritis with rheumatoid factor, unspecified: Secondary | ICD-10-CM | POA: Diagnosis not present

## 2019-07-12 ENCOUNTER — Other Ambulatory Visit: Payer: Self-pay | Admitting: Cardiology

## 2019-07-18 DIAGNOSIS — Z6822 Body mass index (BMI) 22.0-22.9, adult: Secondary | ICD-10-CM | POA: Diagnosis not present

## 2019-07-18 DIAGNOSIS — Z0000 Encounter for general adult medical examination without abnormal findings: Secondary | ICD-10-CM | POA: Diagnosis not present

## 2019-07-18 DIAGNOSIS — Z719 Counseling, unspecified: Secondary | ICD-10-CM | POA: Diagnosis not present

## 2019-07-18 DIAGNOSIS — E039 Hypothyroidism, unspecified: Secondary | ICD-10-CM | POA: Diagnosis not present

## 2019-07-18 DIAGNOSIS — Z1389 Encounter for screening for other disorder: Secondary | ICD-10-CM | POA: Diagnosis not present

## 2019-07-18 DIAGNOSIS — R7309 Other abnormal glucose: Secondary | ICD-10-CM | POA: Diagnosis not present

## 2019-07-18 DIAGNOSIS — E559 Vitamin D deficiency, unspecified: Secondary | ICD-10-CM | POA: Diagnosis not present

## 2019-07-18 DIAGNOSIS — Z720 Tobacco use: Secondary | ICD-10-CM | POA: Diagnosis not present

## 2019-07-18 DIAGNOSIS — E538 Deficiency of other specified B group vitamins: Secondary | ICD-10-CM | POA: Diagnosis not present

## 2019-07-20 NOTE — Progress Notes (Signed)
Cardiology Office Note   Date:  07/21/2019   ID:  SKYY SCHRAEDER, DOB 04-Jul-1950, MRN [id number]  PCP:  Sharilyn Sites, MD  Cardiologist:  Dr. Johnsie Cancel, MD  Chief Complaint  Patient presents with  . Follow-up    History of Present Illness: Emily Valentine is a 69 y.o. female who presents for one year follow up, seen for Dr. Johnsie Cancel.   Emily Valentine has a hx of PVCs, chronic RBBB, tobacco use and atypical chest pain.  She had been seen in 2015 with CP complaints and underwent an echocardiogram as well as a treadmill stress test 11/12/2013.  Stress test had no evidence of ischemia and EF was noted to be 56%.  Follow-up echocardiogram showed an EF of 65 to 70% with vigorous contraction and no significant valvular abnormalities.  She was placed on Toprol 25 mg p.o. daily for her PVCs with improvement.  She was last seen by Dr. Johnsie Cancel 03/23/2017 and continued to smoke.  BP was elevated at that time at 170/110.  Her HCTZ had been stopped therefore this was resumed.  She was then seen in follow-up by Cecilie Kicks, NP with noted improvement in her blood pressures.  She had no further chest tightness.  Plan was to start Chantix for smoking cessation.  Today, she reports that she has been doing well since last visit.  BP is better controlled today at 130/92.  She denies chest pain, palpitations, LE edema, orthopnea, dizziness or syncope.  Has stable, unchanged shortness of breath with exertion however likely in the setting of prolonged tobacco use.  Discussed health maintenance habits including annual chest CT which was ordered by her PCP.  Discussed smoking cessation at length today.  She is willing to try nicotine patch.  Congratulated.  Complete lab work performed by PCP last week which she was told everything was stable.  Follows with her rheumatologist regularly as well.   Past Medical History:  Diagnosis Date  . History of echocardiogram    Echo (8/15): Vigorous LV function, EF 65-70%, normal  wall motion, grade 1 diastolic dysfunction, mild to moderate TR, PASP 31 mm Hg  . Hx of cardiovascular stress test    ETT-Myoview (8/15): no ischemia, EF 56%, normal study  . Hypertension   . Hypothyroid   . Pernicious anemia   . S/P colonoscopy 2001, 2004   2001: hyperplastic polyp, 2004: normal, inflammatory polyp    Past Surgical History:  Procedure Laterality Date  . ABDOMINAL HYSTERECTOMY    . BACK SURGERY    . BREAST BIOPSY Right 10/29/2010  . BREAST BIOPSY Right 11/27/2014  . BREAST EXCISIONAL BIOPSY Right 02/21/2015  . BREAST LUMPECTOMY WITH RADIOACTIVE SEED LOCALIZATION Right 02/21/2015   Procedure: RIGHT BREAST LUMPECTOMY WITH RADIOACTIVE SEED LOCALIZATION;  Surgeon: Rolm Bookbinder, MD;  Location: [facility];  Service: General;  Laterality: Right;  . COLONOSCOPY  11/10/2010   Procedure: COLONOSCOPY;  Surgeon: Daneil Dolin, MD;  [facility];  Service: Endoscopy;  Laterality: N/A;  . COLONOSCOPY N/A 09/28/2016   Dr. Emerson Monte: Diverticulosis with evidence of recent diverticulitis.  Next colonoscopy in 2023 given history of tubular adenomas in the past.  . THYROIDECTOMY       Current Outpatient Medications  Medication Sig Dispense Refill  . acetaminophen (TYLENOL) 650 MG CR tablet Take 650 mg by mouth 2 (two) times daily as needed for pain.    Marland Kitchen aspirin EC 81 MG tablet Take 1 tablet (81 mg  total) by mouth daily.    . Cholecalciferol (VITAMIN D3) 2000 units capsule Take 2,000 Units by mouth daily at 12 noon.    . cyanocobalamin (,VITAMIN B-12,) 1000 MCG/ML injection Inject 1,000 mLs into the skin once a week.    . Cyanocobalamin (VITAMIN B-12 IJ) Inject 1,000 mcg as directed every 30 (thirty) days.     . folic acid (FOLVITE) 1 MG tablet Take 1 mg by mouth daily.    . hydrochlorothiazide (HYDRODIURIL) 25 MG tablet TAKE (1) TABLET BY MOUTH ONCE DAILY. 90 tablet 0  . hydrocortisone cream 1 % Apply 1 application topically daily as needed for itching.     . levothyroxine (SYNTHROID, LEVOTHROID) 88 MCG tablet Take 88 mcg by mouth daily before breakfast.     . losartan (COZAAR) 100 MG tablet Take 100 mg by mouth daily.    Marland Kitchen losartan-hydrochlorothiazide (HYZAAR) 100-25 MG tablet Take 1 tablet by mouth daily. 90 tablet 3  . methotrexate 2.5 MG tablet Take 2.5 mg by mouth once a week. Take 7 tabs once per week    . metoprolol succinate (TOPROL-XL) 25 MG 24 hr tablet TAKE (1) TABLET BY MOUTH DAILY. 30 tablet 0  . Multiple Vitamins-Minerals (MULTIVITAMIN WITH MINERALS) tablet Take 1 tablet by mouth daily at 12 noon.     . nitroGLYCERIN (NITROSTAT) 0.4 MG SL tablet Place 1 tablet (0.4 mg total) under the tongue every 5 (five) minutes as needed for chest pain. 25 tablet 1  . polyethylene glycol (MIRALAX / GLYCOLAX) packet Take 17 g by mouth daily as needed for mild constipation.    Marland Kitchen PROAIR HFA 108 (90 Base) MCG/ACT inhaler Inhale 108 mcg into the lungs as needed.    . traMADol (ULTRAM) 50 MG tablet Take 50 mg by mouth as needed.    . Wheat Dextrin (BENEFIBER) POWD Take 1 Dose by mouth as needed.     . nicotine (NICODERM CQ) 14 mg/24hr patch Place 1 patch (14 mg total) onto the skin daily. 28 patch 1   No current facility-administered medications for this visit.    Allergies:   Codeine and Oxycontin [oxycodone]    Social History:  The patient  reports that she has been smoking cigarettes. She has a 20.00 pack-year smoking history. She has quit using smokeless tobacco. She reports that she does not drink alcohol or use drugs.   Family History:  The patient's family history includes Anemia in her father, mother, and sister; Cancer in her brother, father, and mother; Colon cancer in her father; Diabetes in her sister; Hypertension in her brother, father, mother, and sister; Stomach cancer in her mother; Thyroid disease in her brother, mother, and sister.   ROS:  Please see the history of present illness.   Otherwise, review of systems are positive for  none.   All other systems are reviewed and negative.    PHYSICAL EXAM: VS:  BP (!) 130/92   Pulse 83   Ht 5\' 2"  (1.575 m)   Wt 126 lb 12.8 oz (57.5 kg)   SpO2 98%   BMI 23.19 kg/m  , BMI Body mass index is 23.19 kg/m.   General: Well developed, well nourished, NAD Neck: Negative for carotid bruits. No JVD Lungs: Bilateral expiratory wheezing. Breathing is unlabored. Cardiovascular: RRR with S1 S2.  Extremities: No edema. Radial pulses 2+ bilaterally Neuro: Alert and oriented. No focal deficits. No facial asymmetry. MAE spontaneously. Psych: Responds to questions appropriately with normal affect.    EKG:  EKG is  ordered today. The ekg ordered today demonstrates NSR with RBBB, IVCD, HR 83bpm   Recent Labs: 08/01/2018: BUN 23; Creatinine, Ser 1.13; Potassium 3.7; Sodium 141    Lipid Panel    Component Value Date/Time   CHOL 151 10/16/2013 1023   TRIG 116.0 10/16/2013 1023   HDL 37.50 (L) 10/16/2013 1023   CHOLHDL 4 10/16/2013 1023   VLDL 23.2 10/16/2013 1023   LDLCALC 90 10/16/2013 1023      Wt Readings from Last 3 Encounters:  07/21/19 126 lb 12.8 oz (57.5 kg)  11/25/18 128 lb (58.1 kg)  07/13/18 128 lb (58.1 kg)      Other studies Reviewed: Additional studies/ records that were reviewed today include:. Review of the above records demonstrates:   History of echocardiogram     Echo (8/15): Vigorous LV function, EF 65-70%, normal wall motion, grade 1 diastolic dysfunction, mild to moderate TR, PASP 31 mm Hg   Hx of cardiovascular stress test     ETT-Myoview (8/15): no ischemia, EF 56%, normal study    ASSESSMENT AND PLAN:  1.  Hypertension: -Stable, 130/92 -BP improved with the addition of HCTZ at last OV -Continue current regimen, no change  2.  Atypical chest pain: -No recurrent symptoms better BP control -BP 130/92  3.  Tobacco use: -Cessation strongly encouraged -Previous plan was for Chantix>>>deferred this however is willing to try  Nicotine patch   4. Chronic RBBB: -EKG today shows no change from prior tracing in 2019 -No symptoms today   Current medicines are reviewed at length with the patient today.  The patient does not have concerns regarding medicines.  The following changes have been made: Add nicotine patch x2 months 14 mg  Labs/ tests ordered today include: None   Orders Placed This Encounter  Procedures  . EKG 12-Lead     Disposition:   FU with Dr. Johnsie Cancel in 1 year  Signed, Kathyrn Drown, NP  07/21/2019 3:48 PM    [address], [place], [place]  [address] Phone: [phone]; Fax: [phone]

## 2019-07-21 ENCOUNTER — Other Ambulatory Visit: Payer: Self-pay

## 2019-07-21 ENCOUNTER — Ambulatory Visit: Payer: Medicare PPO | Admitting: Cardiology

## 2019-07-21 ENCOUNTER — Encounter: Payer: Self-pay | Admitting: Cardiology

## 2019-07-21 VITALS — BP 130/92 | HR 83 | Ht 62.0 in | Wt 126.8 lb

## 2019-07-21 DIAGNOSIS — Z720 Tobacco use: Secondary | ICD-10-CM

## 2019-07-21 DIAGNOSIS — I493 Ventricular premature depolarization: Secondary | ICD-10-CM | POA: Diagnosis not present

## 2019-07-21 DIAGNOSIS — I10 Essential (primary) hypertension: Secondary | ICD-10-CM | POA: Diagnosis not present

## 2019-07-21 DIAGNOSIS — I4510 Unspecified right bundle-branch block: Secondary | ICD-10-CM

## 2019-07-21 MED ORDER — NICOTINE 14 MG/24HR TD PT24: 14.0000 mg | MEDICATED_PATCH | Freq: Every day | TRANSDERMAL | 1 refills | Status: DC

## 2019-07-21 NOTE — Patient Instructions (Signed)
Medication Instructions:  Your physician recommends that you continue on your current medications as directed. Please refer to the Current Medication list given to you today.  *If you need a refill on your cardiac medications before your next appointment, please call your pharmacy*  Lab Work: None ordered today  Testing/Procedures: None ordered today  Follow-Up: At [facility], you and your health needs are our priority.  As part of our continuing mission to provide you with exceptional heart care, we have created designated Provider Care Teams.  These Care Teams include your primary Cardiologist (physician) and Advanced Practice Providers (APPs -  Physician Assistants and Nurse Practitioners) who all work together to provide you with the care you need, when you need it.  We recommend signing up for the patient portal called "MyChart".  Sign up information is provided on this After Visit Summary.  MyChart is used to connect with patients for Virtual Visits (Telemedicine).  Patients are able to view lab/test results, encounter notes, upcoming appointments, etc.  Non-urgent messages can be sent to your provider as well.   To learn more about what you can do with MyChart, go to [email or web address].    Your next appointment:   12 month(s)  The format for your next appointment:   In Person  Provider:   Peter Nishan, MD    

## 2019-07-31 ENCOUNTER — Other Ambulatory Visit: Payer: Self-pay | Admitting: Family Medicine

## 2019-07-31 ENCOUNTER — Other Ambulatory Visit (HOSPITAL_COMMUNITY): Payer: Self-pay | Admitting: Family Medicine

## 2019-07-31 DIAGNOSIS — F17210 Nicotine dependence, cigarettes, uncomplicated: Secondary | ICD-10-CM

## 2019-07-31 DIAGNOSIS — Z122 Encounter for screening for malignant neoplasm of respiratory organs: Secondary | ICD-10-CM

## 2019-08-01 ENCOUNTER — Other Ambulatory Visit: Payer: Self-pay | Admitting: Cardiology

## 2019-08-15 ENCOUNTER — Other Ambulatory Visit: Payer: Self-pay

## 2019-08-15 ENCOUNTER — Ambulatory Visit (HOSPITAL_COMMUNITY)
Admission: RE | Admit: 2019-08-15 | Discharge: 2019-08-15 | Disposition: A | Discharge status: Home / Self Care | Payer: Medicare PPO | Source: Ambulatory Visit | Attending: Family Medicine | Admitting: Family Medicine

## 2019-08-15 DIAGNOSIS — F17210 Nicotine dependence, cigarettes, uncomplicated: Secondary | ICD-10-CM | POA: Insufficient documentation

## 2019-08-15 DIAGNOSIS — Z122 Encounter for screening for malignant neoplasm of respiratory organs: Secondary | ICD-10-CM | POA: Diagnosis not present

## 2019-08-29 DIAGNOSIS — M25572 Pain in left ankle and joints of left foot: Secondary | ICD-10-CM | POA: Diagnosis not present

## 2019-08-29 DIAGNOSIS — M25519 Pain in unspecified shoulder: Secondary | ICD-10-CM | POA: Diagnosis not present

## 2019-08-29 DIAGNOSIS — M79672 Pain in left foot: Secondary | ICD-10-CM | POA: Diagnosis not present

## 2019-08-29 DIAGNOSIS — M542 Cervicalgia: Secondary | ICD-10-CM | POA: Diagnosis not present

## 2019-08-29 DIAGNOSIS — M25529 Pain in unspecified elbow: Secondary | ICD-10-CM | POA: Diagnosis not present

## 2019-08-29 DIAGNOSIS — M8580 Other specified disorders of bone density and structure, unspecified site: Secondary | ICD-10-CM | POA: Diagnosis not present

## 2019-08-29 DIAGNOSIS — M059 Rheumatoid arthritis with rheumatoid factor, unspecified: Secondary | ICD-10-CM | POA: Diagnosis not present

## 2019-08-29 DIAGNOSIS — M25539 Pain in unspecified wrist: Secondary | ICD-10-CM | POA: Diagnosis not present

## 2019-08-29 DIAGNOSIS — M1990 Unspecified osteoarthritis, unspecified site: Secondary | ICD-10-CM | POA: Diagnosis not present

## 2019-08-29 DIAGNOSIS — M79671 Pain in right foot: Secondary | ICD-10-CM | POA: Diagnosis not present

## 2019-08-29 DIAGNOSIS — M79643 Pain in unspecified hand: Secondary | ICD-10-CM | POA: Diagnosis not present

## 2019-08-29 DIAGNOSIS — Z79899 Other long term (current) drug therapy: Secondary | ICD-10-CM | POA: Diagnosis not present

## 2019-08-29 DIAGNOSIS — M25571 Pain in right ankle and joints of right foot: Secondary | ICD-10-CM | POA: Diagnosis not present

## 2019-08-29 DIAGNOSIS — M25579 Pain in unspecified ankle and joints of unspecified foot: Secondary | ICD-10-CM | POA: Diagnosis not present

## 2019-10-11 ENCOUNTER — Other Ambulatory Visit: Payer: Self-pay | Admitting: Cardiology

## 2019-11-22 ENCOUNTER — Encounter: Payer: Self-pay | Admitting: Internal Medicine

## 2019-11-29 DIAGNOSIS — M059 Rheumatoid arthritis with rheumatoid factor, unspecified: Secondary | ICD-10-CM | POA: Diagnosis not present

## 2019-11-29 DIAGNOSIS — M25519 Pain in unspecified shoulder: Secondary | ICD-10-CM | POA: Diagnosis not present

## 2019-11-29 DIAGNOSIS — M25529 Pain in unspecified elbow: Secondary | ICD-10-CM | POA: Diagnosis not present

## 2019-11-29 DIAGNOSIS — M8580 Other specified disorders of bone density and structure, unspecified site: Secondary | ICD-10-CM | POA: Diagnosis not present

## 2019-11-29 DIAGNOSIS — M25511 Pain in right shoulder: Secondary | ICD-10-CM | POA: Diagnosis not present

## 2019-11-29 DIAGNOSIS — M1990 Unspecified osteoarthritis, unspecified site: Secondary | ICD-10-CM | POA: Diagnosis not present

## 2019-11-29 DIAGNOSIS — M25539 Pain in unspecified wrist: Secondary | ICD-10-CM | POA: Diagnosis not present

## 2019-11-29 DIAGNOSIS — Z79899 Other long term (current) drug therapy: Secondary | ICD-10-CM | POA: Diagnosis not present

## 2019-11-29 DIAGNOSIS — M79643 Pain in unspecified hand: Secondary | ICD-10-CM | POA: Diagnosis not present

## 2019-12-29 ENCOUNTER — Other Ambulatory Visit (HOSPITAL_COMMUNITY): Payer: Self-pay | Admitting: Family Medicine

## 2019-12-29 DIAGNOSIS — Z1231 Encounter for screening mammogram for malignant neoplasm of breast: Secondary | ICD-10-CM

## 2020-01-15 ENCOUNTER — Other Ambulatory Visit: Payer: Self-pay

## 2020-01-15 ENCOUNTER — Ambulatory Visit (HOSPITAL_COMMUNITY)
Admission: RE | Admit: 2020-01-15 | Discharge: 2020-01-15 | Disposition: A | Discharge status: Home / Self Care | Payer: Medicare PPO | Source: Ambulatory Visit | Attending: Family Medicine | Admitting: Family Medicine

## 2020-01-15 DIAGNOSIS — Z1231 Encounter for screening mammogram for malignant neoplasm of breast: Secondary | ICD-10-CM | POA: Diagnosis not present

## 2020-01-18 ENCOUNTER — Other Ambulatory Visit: Payer: Medicare PPO | Admitting: Adult Health

## 2020-01-18 NOTE — Progress Notes (Signed)
Referring Provider: Sharilyn Sites, MD Primary Care Physician:  Sharilyn Sites, MD Primary GI Physician: Dr. Gala Romney  Chief Complaint  Patient presents with  . Follow-up    doing ok    HPI:   Emily Valentine is a 69 y.o. female presenting today for follow-up of constipation.  Has history of diverticulitis and colonic polyps.  Last colonoscopy in July 2018 with diverticulosis with evidence of recent diverticulitis.  Recommended next colonoscopy in 2023 due to history of tubular adenomas in the past.  She was last seen 11/25/2018.  Constipation was fairly well managed with Benefiber and MiraLAX on an as-needed basis for constipation.  Advised to take Benefiber 2 tablespoons daily every day and use MiraLAX nightly as needed.  Follow-up in 1 year.  Today: Having BMs about twice a week. Stools are soft and formed. This is her baseline. Taking benefiber every day. MiraLAX as needed a couple times a week. Feels her bowels should be moving more frequently. States she is uncomfortable between bowel movements at times like things are moving around. Sometimes she wakes up with discomfort in her left side. This is about 3 times a week. Thinks it is gas as it improves after passing gas. This started over the last couple of months. No pain currently. No fever or chills.   No nausea or vomiting. No blood in the stool or black stool. No unintentional weight loss.   No GERD symptoms or dysphagia.   Past Medical History:  Diagnosis Date  . History of echocardiogram    Echo (8/15): Vigorous LV function, EF 65-70%, normal wall motion, grade 1 diastolic dysfunction, mild to moderate TR, PASP 31 mm Hg  . Hx of cardiovascular stress test    ETT-Myoview (8/15): no ischemia, EF 56%, normal study  . Hypertension   . Hypothyroid   . Pernicious anemia   . S/P colonoscopy 2001, 2004   2001: hyperplastic polyp, 2004: normal, inflammatory polyp    Past Surgical History:  Procedure Laterality Date  . ABDOMINAL  HYSTERECTOMY    . BACK SURGERY    . BREAST BIOPSY Right 10/29/2010  . BREAST BIOPSY Right 11/27/2014  . BREAST EXCISIONAL BIOPSY Right 02/21/2015  . BREAST LUMPECTOMY WITH RADIOACTIVE SEED LOCALIZATION Right 02/21/2015   Procedure: RIGHT BREAST LUMPECTOMY WITH RADIOACTIVE SEED LOCALIZATION;  Surgeon: Rolm Bookbinder, MD;  Location: [facility];  Service: General;  Laterality: Right;  . COLONOSCOPY  11/10/2010   Procedure: COLONOSCOPY;  Surgeon: Daneil Dolin, MD;  [facility];  Service: Endoscopy;  Laterality: N/A;  . COLONOSCOPY N/A 09/28/2016   Dr. Emerson Monte: Diverticulosis with evidence of recent diverticulitis.  Next colonoscopy in 2023 given history of tubular adenomas in the past.  . THYROIDECTOMY      Current Outpatient Medications  Medication Sig Dispense Refill  . acetaminophen (TYLENOL) 650 MG CR tablet Take 650 mg by mouth 2 (two) times daily as needed for pain.    Marland Kitchen aspirin EC 81 MG tablet Take 1 tablet (81 mg total) by mouth daily.    . Cholecalciferol (VITAMIN D3) 2000 units capsule Take 2,000 Units by mouth daily at 12 noon.    . Cyanocobalamin (VITAMIN B-12 IJ) Inject 1,000 mcg as directed every 30 (thirty) days.     . folic acid (FOLVITE) 1 MG tablet Take 1 mg by mouth daily.    . hydrochlorothiazide (HYDRODIURIL) 25 MG tablet TAKE (1) TABLET BY MOUTH ONCE DAILY. 90 tablet 3  . hydrocortisone cream 1 %  Apply 1 application topically daily as needed for itching.    . levothyroxine (SYNTHROID, LEVOTHROID) 88 MCG tablet Take 88 mcg by mouth daily before breakfast.     . losartan (COZAAR) 100 MG tablet TAKE ONE TABLET BY MOUTH ONCE DAILY. 90 tablet 3  . losartan-hydrochlorothiazide (HYZAAR) 100-25 MG tablet Take 1 tablet by mouth daily. 90 tablet 3  . methotrexate 2.5 MG tablet Take 2.5 mg by mouth once a week. Take 7 tabs once per week    . metoprolol succinate (TOPROL-XL) 25 MG 24 hr tablet TAKE (1) TABLET BY MOUTH DAILY. 30 tablet 0  . Multiple  Vitamins-Minerals (MULTIVITAMIN WITH MINERALS) tablet Take 1 tablet by mouth daily at 12 noon.     . nitroGLYCERIN (NITROSTAT) 0.4 MG SL tablet Place 1 tablet (0.4 mg total) under the tongue every 5 (five) minutes as needed for chest pain. 25 tablet 1  . polyethylene glycol (MIRALAX / GLYCOLAX) packet Take 17 g by mouth daily as needed for mild constipation.    Marland Kitchen PROAIR HFA 108 (90 Base) MCG/ACT inhaler Inhale 108 mcg into the lungs as needed.    . traMADol (ULTRAM) 50 MG tablet Take 50 mg by mouth as needed.    . Wheat Dextrin (BENEFIBER) POWD Take 1 Dose by mouth as needed.     . nicotine (NICODERM CQ) 14 mg/24hr patch Place 1 patch (14 mg total) onto the skin daily. (Patient not taking: Reported on 01/19/2020) 28 patch 1   No current facility-administered medications for this visit.    Allergies as of 01/19/2020 - Review Complete 01/19/2020  Allergen Reaction Noted  . Codeine Nausea Only 08/24/2016  . Oxycontin [oxycodone] Nausea And Vomiting 09/01/2011    Family History  Problem Relation Age of Onset  . Stomach cancer Mother        deceased  . Anemia Mother   . Cancer Mother   . Hypertension Mother   . Thyroid disease Mother   . Colon cancer Father        diagnosed age 85, died at age 63  . Anemia Father   . Cancer Father   . Hypertension Father   . Anemia Sister   . Cancer Brother   . Diabetes Sister   . Hypertension Sister   . Hypertension Brother   . Thyroid disease Brother   . Thyroid disease Sister     Social History   Socioeconomic History  . Marital status: Married    Spouse name: Not on file  . Number of children: Not on file  . Years of education: Not on file  . Highest education level: Not on file  Occupational History  . Not on file  Tobacco Use  . Smoking status: Current Every Day Smoker    Packs/day: 1.00    Years: 20.00    Pack years: 20.00    Types: Cigarettes  . Smokeless tobacco: Former Network engineer  . Vaping Use: Former  Substance and  Sexual Activity  . Alcohol use: No  . Drug use: No  . Sexual activity: Yes    Birth control/protection: Surgical  Other Topics Concern  . Not on file  Social History Narrative  . Not on file   Social Determinants of Health   Financial Resource Strain:   . Difficulty of Paying Living Expenses: Not on file  Food Insecurity:   . Worried About Charity fundraiser in the Last Year: Not on file  . Ran Out of Food in  the Last Year: Not on file  Transportation Needs:   . Lack of Transportation (Medical): Not on file  . Lack of Transportation (Non-Medical): Not on file  Physical Activity:   . Days of Exercise per Week: Not on file  . Minutes of Exercise per Session: Not on file  Stress:   . Feeling of Stress : Not on file  Social Connections:   . Frequency of Communication with Friends and Family: Not on file  . Frequency of Social Gatherings with Friends and Family: Not on file  . Attends Religious Services: Not on file  . Active Member of Clubs or Organizations: Not on file  . Attends Archivist Meetings: Not on file  . Marital Status: Not on file    Review of Systems: Gen: Denies fever, chills, cold or flulike symptoms, lightheadedness, dizziness, presyncope, syncope. CV: Denies chest pain or palpitations Resp: Denies dyspnea.  Admits to intermittent cough. GI: See HPI Heme: See HPI  Physical Exam: BP 138/87   Pulse 88   Temp 97.6 F (36.4 C) (Temporal)   Ht 5\' 3"  (1.6 m)   Wt 126 lb (57.2 kg)   BMI 22.32 kg/m  General:   Alert and oriented. No distress noted. Pleasant and cooperative.  Head:  Normocephalic and atraumatic. Eyes:  Conjuctiva clear without scleral icterus. Heart:  S1, S2 present without murmurs appreciated. Lungs:  Clear to auscultation bilaterally. No wheezes, rales, or rhonchi. No distress.  Abdomen:  +BS, soft, non-tender and non-distended. No rebound or guarding. No HSM or masses noted. Msk:  Symmetrical without gross deformities. Normal  posture. Extremities:  Without edema. Neurologic:  Alert and  oriented x4 Psych:  Normal mood and affect.

## 2020-01-19 ENCOUNTER — Encounter: Payer: Self-pay | Admitting: Gastroenterology

## 2020-01-19 ENCOUNTER — Ambulatory Visit: Payer: Medicare PPO | Admitting: Gastroenterology

## 2020-01-19 ENCOUNTER — Other Ambulatory Visit: Payer: Self-pay

## 2020-01-19 VITALS — BP 138/87 | HR 88 | Temp 97.6°F | Ht 63.0 in | Wt 126.0 lb

## 2020-01-19 DIAGNOSIS — K5900 Constipation, unspecified: Secondary | ICD-10-CM | POA: Diagnosis not present

## 2020-01-19 NOTE — Patient Instructions (Addendum)
Continue taking Benefiber daily.  You may try taking MiraLAX 1 capful (17 g) daily in 8 ounces of water.  If your stools become too loose, you may decrease MiraLAX to one half capful or take this every other day.  You may also try adding a daily probiotic to help with gas production.  Align, digestive advantage, and Phillips colon health are good options.  Avoid gas producing items including broccoli, cauliflower, cabbage, Brussels sprouts, beans, carbonated beverages, artificial sweeteners, drinking through a straw, chewing gum.   Be sure you're drinking enough water to keep urine pale yellow to clear.  We'll plan to see back in 6 months.  Do not hesitate to call if you have questions or concerns prior.  Aliene Altes, PA-C [facility]

## 2020-01-19 NOTE — Assessment & Plan Note (Addendum)
69 year old female with chronic history of constipation which is at baseline with soft BMs a couple times a week taking Benefiber daily and MiraLAX a couple times a week.  She does feel she could have some improvement in her bowel habits and notes mild abdominal discomfort between bowel movements.  Suspect this will be improved with improvement in bowel regularity.  Also notes some increased gas production.  No alarm symptoms. Abdominal exam is benign. Colonoscopy is up-to-date 2018 and she is due for repeat in 2023.  Plan: Continue Benefiber daily. Increase MiraLAX 1 capful (17 grams) daily in 8 ounces of water. If she develops frequent loose stools, she may decrease MiraLAX to one half capful or every other day. Also advised trying a daily probiotic such as align, Hardin Negus colon health, or digestive advantage to help with increased gas. Advised she avoid gas producing items including broccoli, cauliflower, cabbage, Brussels sprouts, beans, carbonated beverages, artificial sweeteners, drinking through a straw, chewing gum.  Drink water to keep urine pale yellow to clear. Follow-up in 6 months.  Call with questions or concerns prior.

## 2020-01-22 ENCOUNTER — Encounter: Payer: Self-pay | Admitting: Obstetrics & Gynecology

## 2020-01-22 ENCOUNTER — Ambulatory Visit (INDEPENDENT_AMBULATORY_CARE_PROVIDER_SITE_OTHER): Payer: Medicare PPO | Admitting: Obstetrics & Gynecology

## 2020-01-22 VITALS — BP 147/94 | HR 72 | Ht 63.0 in | Wt 125.0 lb

## 2020-01-22 DIAGNOSIS — Z01419 Encounter for gynecological examination (general) (routine) without abnormal findings: Secondary | ICD-10-CM | POA: Diagnosis not present

## 2020-01-22 DIAGNOSIS — Z1211 Encounter for screening for malignant neoplasm of colon: Secondary | ICD-10-CM

## 2020-01-22 DIAGNOSIS — Z1212 Encounter for screening for malignant neoplasm of rectum: Secondary | ICD-10-CM

## 2020-01-22 NOTE — Progress Notes (Signed)
Subjective:     Emily Valentine is a 69 y.o. female here for a routine exam.  No LMP recorded. Patient has had a hysterectomy. G3P3 Birth Control Method:  hysterectomy Menstrual Calendar(currently):   Current complaints: none.   Current acute medical issues:  RA   Recent Gynecologic History No LMP recorded. Patient has had a hysterectomy. Last Pap: n/a,   Last mammogram: 10/21,  normal  Past Medical History:  Diagnosis Date  . History of echocardiogram    Echo (8/15): Vigorous LV function, EF 65-70%, normal wall motion, grade 1 diastolic dysfunction, mild to moderate TR, PASP 31 mm Hg  . Hx of cardiovascular stress test    ETT-Myoview (8/15): no ischemia, EF 56%, normal study  . Hypertension   . Hypothyroid   . Pernicious anemia   . PVC's (premature ventricular contractions)   . Right bundle branch block   . S/P colonoscopy 2001, 2004   2001: hyperplastic polyp, 2004: normal, inflammatory polyp    Past Surgical History:  Procedure Laterality Date  . ABDOMINAL HYSTERECTOMY    . BACK SURGERY    . BREAST BIOPSY Right 10/29/2010  . BREAST BIOPSY Right 11/27/2014  . BREAST EXCISIONAL BIOPSY Right 02/21/2015  . BREAST LUMPECTOMY WITH RADIOACTIVE SEED LOCALIZATION Right 02/21/2015   Procedure: RIGHT BREAST LUMPECTOMY WITH RADIOACTIVE SEED LOCALIZATION;  Surgeon: Rolm Bookbinder, MD;  Location: [facility];  Service: General;  Laterality: Right;  . COLONOSCOPY  11/10/2010   Procedure: COLONOSCOPY;  Surgeon: Daneil Dolin, MD;  Location: [facility];  Service: Endoscopy;  Laterality: N/A;  . COLONOSCOPY N/A 09/28/2016   Dr. Emerson Monte: Diverticulosis with evidence of recent diverticulitis.  Next colonoscopy in 2023 given history of tubular adenomas in the past.  . THYROIDECTOMY      OB History    Gravida  3   Para  3   Term      Preterm      AB      Living  3     SAB      TAB      Ectopic      Multiple      Live Births              Social  History   Socioeconomic History  . Marital status: Married    Spouse name: Not on file  . Number of children: Not on file  . Years of education: Not on file  . Highest education level: Not on file  Occupational History  . Not on file  Tobacco Use  . Smoking status: Current Every Day Smoker    Packs/day: 1.00    Years: 20.00    Pack years: 20.00    Types: Cigarettes  . Smokeless tobacco: Former Network engineer  . Vaping Use: Former  Substance and Sexual Activity  . Alcohol use: No  . Drug use: No  . Sexual activity: Yes    Birth control/protection: Surgical  Other Topics Concern  . Not on file  Social History Narrative  . Not on file   Social Determinants of Health   Financial Resource Strain:   . Difficulty of Paying Living Expenses: Not on file  Food Insecurity:   . Worried About Charity fundraiser in the Last Year: Not on file  . Ran Out of Food in the Last Year: Not on file  Transportation Needs:   . Lack of Transportation (Medical): Not on file  . Lack of  Transportation (Non-Medical): Not on file  Physical Activity:   . Days of Exercise per Week: Not on file  . Minutes of Exercise per Session: Not on file  Stress:   . Feeling of Stress : Not on file  Social Connections:   . Frequency of Communication with Friends and Family: Not on file  . Frequency of Social Gatherings with Friends and Family: Not on file  . Attends Religious Services: Not on file  . Active Member of Clubs or Organizations: Not on file  . Attends Archivist Meetings: Not on file  . Marital Status: Not on file    Family History  Problem Relation Age of Onset  . Stomach cancer Mother        deceased  . Anemia Mother   . Cancer Mother   . Hypertension Mother   . Thyroid disease Mother   . Colon cancer Father        diagnosed age 66, died at age 37  . Anemia Father   . Cancer Father   . Hypertension Father   . Anemia Sister   . Cancer Brother   . Diabetes Sister   .  Hypertension Sister   . Hypertension Brother   . Thyroid disease Brother   . Thyroid disease Sister      Current Outpatient Medications:  .  acetaminophen (TYLENOL) 650 MG CR tablet, Take 650 mg by mouth 2 (two) times daily as needed for pain., Disp: , Rfl:  .  aspirin EC 81 MG tablet, Take 1 tablet (81 mg total) by mouth daily., Disp: , Rfl:  .  Cholecalciferol (VITAMIN D3) 2000 units capsule, Take 2,000 Units by mouth daily at 12 noon., Disp: , Rfl:  .  Cyanocobalamin (VITAMIN B-12 IJ), Inject 1,000 mcg as directed every 30 (thirty) days. , Disp: , Rfl:  .  folic acid (FOLVITE) 1 MG tablet, Take 1 mg by mouth daily., Disp: , Rfl:  .  hydrochlorothiazide (HYDRODIURIL) 25 MG tablet, TAKE (1) TABLET BY MOUTH ONCE DAILY., Disp: 90 tablet, Rfl: 3 .  hydrocortisone cream 1 %, Apply 1 application topically daily as needed for itching., Disp: , Rfl:  .  levothyroxine (SYNTHROID, LEVOTHROID) 88 MCG tablet, Take 88 mcg by mouth daily before breakfast. , Disp: , Rfl:  .  losartan (COZAAR) 100 MG tablet, TAKE ONE TABLET BY MOUTH ONCE DAILY., Disp: 90 tablet, Rfl: 3 .  methotrexate 2.5 MG tablet, Take 2.5 mg by mouth once a week. Take 7 tabs once per week, Disp: , Rfl:  .  metoprolol succinate (TOPROL-XL) 25 MG 24 hr tablet, TAKE (1) TABLET BY MOUTH DAILY., Disp: 30 tablet, Rfl: 0 .  Multiple Vitamins-Minerals (MULTIVITAMIN WITH MINERALS) tablet, Take 1 tablet by mouth daily at 12 noon. , Disp: , Rfl:  .  nitroGLYCERIN (NITROSTAT) 0.4 MG SL tablet, Place 1 tablet (0.4 mg total) under the tongue every 5 (five) minutes as needed for chest pain., Disp: 25 tablet, Rfl: 1 .  polyethylene glycol (MIRALAX / GLYCOLAX) packet, Take 17 g by mouth daily as needed for mild constipation., Disp: , Rfl:  .  PROAIR HFA 108 (90 Base) MCG/ACT inhaler, Inhale 108 mcg into the lungs as needed., Disp: , Rfl:  .  traMADol (ULTRAM) 50 MG tablet, Take 50 mg by mouth as needed., Disp: , Rfl:  .  Wheat Dextrin (BENEFIBER) POWD,  Take 1 Dose by mouth as needed. , Disp: , Rfl:   Review of Systems  Review of Systems  Constitutional: Negative for fever, chills, weight loss, malaise/fatigue and diaphoresis.  HENT: Negative for hearing loss, ear pain, nosebleeds, congestion, sore throat, neck pain, tinnitus and ear discharge.   Eyes: Negative for blurred vision, double vision, photophobia, pain, discharge and redness.  Respiratory: Negative for cough, hemoptysis, sputum production, shortness of breath, wheezing and stridor.   Cardiovascular: Negative for chest pain, palpitations, orthopnea, claudication, leg swelling and PND.  Gastrointestinal: negative for abdominal pain. Negative for heartburn, nausea, vomiting, diarrhea, constipation, blood in stool and melena.  Genitourinary: Negative for dysuria, urgency, frequency, hematuria and flank pain.  Musculoskeletal: Negative for myalgias, back pain, joint pain and falls.  Skin: Negative for itching and rash.  Neurological: Negative for dizziness, tingling, tremors, sensory change, speech change, focal weakness, seizures, loss of consciousness, weakness and headaches.  Endo/Heme/Allergies: Negative for environmental allergies and polydipsia. Does not bruise/bleed easily.  Psychiatric/Behavioral: Negative for depression, suicidal ideas, hallucinations, memory loss and substance abuse. The patient is not nervous/anxious and does not have insomnia.        Objective:  Blood pressure (!) 147/94, pulse 72, height 5\' 3"  (1.6 m), weight 125 lb (56.7 kg).   Physical Exam  Vitals reviewed. Constitutional: She is oriented to person, place, and time. She appears well-developed and well-nourished.  HENT:  Head: Normocephalic and atraumatic.        Right Ear: External ear normal.  Left Ear: External ear normal.  Nose: Nose normal.  Mouth/Throat: Oropharynx is clear and moist.  Eyes: Conjunctivae and EOM are normal. Pupils are equal, round, and reactive to light. Right eye exhibits  no discharge. Left eye exhibits no discharge. No scleral icterus.  Neck: Normal range of motion. Neck supple. No tracheal deviation present. No thyromegaly present.  Cardiovascular: Normal rate, regular rhythm, normal heart sounds and intact distal pulses.  Exam reveals no gallop and no friction rub.   No murmur heard. Respiratory: Effort normal and breath sounds normal. No respiratory distress. She has no wheezes. She has no rales. She exhibits no tenderness.  GI: Soft. Bowel sounds are normal. She exhibits no distension and no mass. There is no tenderness. There is no rebound and no guarding.  Genitourinary:  Breasts no masses skin changes or nipple changes bilaterally      Vulva is normal without lesions Vagina is pink moist without discharge Cervix absent and pap is not done Uterus is absent Adnexa is negative  {Rectal    hemoccult negative, normal tone, no masses  Musculoskeletal: Normal range of motion. She exhibits no edema and no tenderness.  Neurological: She is alert and oriented to person, place, and time. She has normal reflexes. She displays normal reflexes. No cranial nerve deficit. She exhibits normal muscle tone. Coordination normal.  Skin: Skin is warm and dry. No rash noted. No erythema. No pallor.  Psychiatric: She has a normal mood and affect. Her behavior is normal. Judgment and thought content normal.       Medications Ordered at today's visit: No orders of the defined types were placed in this encounter.   Other orders placed at today's visit: No orders of the defined types were placed in this encounter.     Assessment:    Normal Gyn exam.   s/p hysterectomy Plan:    Mammogram ordered. Follow up in: 3 years.     Return in about 3 years (around 01/22/2023) for yearly.

## 2020-01-23 NOTE — Progress Notes (Signed)
Cc'ed to pcp °

## 2020-02-28 DIAGNOSIS — Z79899 Other long term (current) drug therapy: Secondary | ICD-10-CM | POA: Diagnosis not present

## 2020-02-28 DIAGNOSIS — M059 Rheumatoid arthritis with rheumatoid factor, unspecified: Secondary | ICD-10-CM | POA: Diagnosis not present

## 2020-02-28 DIAGNOSIS — J449 Chronic obstructive pulmonary disease, unspecified: Secondary | ICD-10-CM | POA: Diagnosis not present

## 2020-02-28 DIAGNOSIS — M1990 Unspecified osteoarthritis, unspecified site: Secondary | ICD-10-CM | POA: Diagnosis not present

## 2020-02-28 DIAGNOSIS — M8580 Other specified disorders of bone density and structure, unspecified site: Secondary | ICD-10-CM | POA: Diagnosis not present

## 2020-05-30 DIAGNOSIS — J449 Chronic obstructive pulmonary disease, unspecified: Secondary | ICD-10-CM | POA: Diagnosis not present

## 2020-05-30 DIAGNOSIS — Z6822 Body mass index (BMI) 22.0-22.9, adult: Secondary | ICD-10-CM | POA: Diagnosis not present

## 2020-05-30 DIAGNOSIS — Z1331 Encounter for screening for depression: Secondary | ICD-10-CM | POA: Diagnosis not present

## 2020-05-30 DIAGNOSIS — M069 Rheumatoid arthritis, unspecified: Secondary | ICD-10-CM | POA: Diagnosis not present

## 2020-05-30 DIAGNOSIS — F17210 Nicotine dependence, cigarettes, uncomplicated: Secondary | ICD-10-CM | POA: Diagnosis not present

## 2020-05-30 DIAGNOSIS — Z720 Tobacco use: Secondary | ICD-10-CM | POA: Diagnosis not present

## 2020-05-30 DIAGNOSIS — J439 Emphysema, unspecified: Secondary | ICD-10-CM | POA: Diagnosis not present

## 2020-05-30 DIAGNOSIS — E063 Autoimmune thyroiditis: Secondary | ICD-10-CM | POA: Diagnosis not present

## 2020-06-04 DIAGNOSIS — M059 Rheumatoid arthritis with rheumatoid factor, unspecified: Secondary | ICD-10-CM | POA: Diagnosis not present

## 2020-06-04 DIAGNOSIS — M545 Low back pain, unspecified: Secondary | ICD-10-CM | POA: Diagnosis not present

## 2020-06-04 DIAGNOSIS — M533 Sacrococcygeal disorders, not elsewhere classified: Secondary | ICD-10-CM | POA: Diagnosis not present

## 2020-06-04 DIAGNOSIS — M25559 Pain in unspecified hip: Secondary | ICD-10-CM | POA: Diagnosis not present

## 2020-06-04 DIAGNOSIS — Z79899 Other long term (current) drug therapy: Secondary | ICD-10-CM | POA: Diagnosis not present

## 2020-06-04 DIAGNOSIS — M549 Dorsalgia, unspecified: Secondary | ICD-10-CM | POA: Diagnosis not present

## 2020-06-04 DIAGNOSIS — M160 Bilateral primary osteoarthritis of hip: Secondary | ICD-10-CM | POA: Diagnosis not present

## 2020-06-04 DIAGNOSIS — J449 Chronic obstructive pulmonary disease, unspecified: Secondary | ICD-10-CM | POA: Diagnosis not present

## 2020-06-04 DIAGNOSIS — M25751 Osteophyte, right hip: Secondary | ICD-10-CM | POA: Diagnosis not present

## 2020-06-04 DIAGNOSIS — M8580 Other specified disorders of bone density and structure, unspecified site: Secondary | ICD-10-CM | POA: Diagnosis not present

## 2020-06-04 DIAGNOSIS — M1990 Unspecified osteoarthritis, unspecified site: Secondary | ICD-10-CM | POA: Diagnosis not present

## 2020-06-04 DIAGNOSIS — M5136 Other intervertebral disc degeneration, lumbar region: Secondary | ICD-10-CM | POA: Diagnosis not present

## 2020-07-18 ENCOUNTER — Institutional Professional Consult (permissible substitution): Payer: Medicare PPO | Admitting: Internal Medicine

## 2020-07-26 ENCOUNTER — Ambulatory Visit: Payer: Medicare PPO | Admitting: Gastroenterology

## 2020-07-29 ENCOUNTER — Other Ambulatory Visit: Payer: Self-pay | Admitting: Cardiology

## 2020-07-30 ENCOUNTER — Encounter: Payer: Self-pay | Admitting: Internal Medicine

## 2020-07-30 ENCOUNTER — Ambulatory Visit: Payer: Medicare PPO | Admitting: Internal Medicine

## 2020-07-30 ENCOUNTER — Other Ambulatory Visit: Payer: Self-pay

## 2020-07-30 VITALS — BP 124/90 | HR 78 | Temp 98.7°F | Ht 61.0 in | Wt 125.0 lb

## 2020-07-30 DIAGNOSIS — J449 Chronic obstructive pulmonary disease, unspecified: Secondary | ICD-10-CM | POA: Insufficient documentation

## 2020-07-30 DIAGNOSIS — R0600 Dyspnea, unspecified: Secondary | ICD-10-CM

## 2020-07-30 DIAGNOSIS — F17210 Nicotine dependence, cigarettes, uncomplicated: Secondary | ICD-10-CM

## 2020-07-30 DIAGNOSIS — J441 Chronic obstructive pulmonary disease with (acute) exacerbation: Secondary | ICD-10-CM | POA: Insufficient documentation

## 2020-07-30 DIAGNOSIS — R0609 Other forms of dyspnea: Secondary | ICD-10-CM

## 2020-07-30 MED ORDER — ANORO ELLIPTA 62.5-25 MCG/INH IN AEPB: 1.0000 | INHALATION_SPRAY | Freq: Every day | RESPIRATORY_TRACT | 0 refills | Status: DC

## 2020-07-30 MED ORDER — ANORO ELLIPTA 62.5-25 MCG/INH IN AEPB: 1.0000 | INHALATION_SPRAY | Freq: Every day | RESPIRATORY_TRACT | 11 refills | Status: DC

## 2020-07-30 NOTE — Assessment & Plan Note (Signed)
Counseled re importance of smoking cessation but did not meet time criteria for separate billing            Each maintenance medication was reviewed in detail including emphasizing most importantly the difference between maintenance and prns and under what circumstances the prns are to be triggered using an action plan format where appropriate.  Total time for H and P, chart review, counseling, reviewing elipta  device(s) and generating customized AVS unique to this office visit / same day charting = 45 min

## 2020-07-30 NOTE — Assessment & Plan Note (Signed)
Active smoker - h/o probable childhood asthma - 07/30/2020  After extensive coaching inhaler device,  effectiveness =    90% with DPI > try anoro   Pt is Group B in terms of symptom/risk and laba/lama therefore appropriate rx at this point >>>  anoro one click each am  And approp saba;  I spent extra time with pt today reviewing appropriate use of albuterol for prn use on exertion with the following points: 1) saba is for relief of sob that does not improve by walking a slower pace or resting but rather if the pt does not improve after trying this first. 2) If the pt is convinced, as many are, that saba helps recover from activity faster then it's easy to tell if this is the case by re-challenging : ie stop, take the inhaler, then p 5 minutes try the exact same activity (intensity of workload) that just caused the symptoms and see if they are substantially diminished or not after saba 3) if there is an activity that reproducibly causes the symptoms, try the saba 15 min before the activity on alternate days   If in fact the saba really does help, then fine to continue to use it prn but advised may need to look closer at the maintenance regimen being used to achieve better control of airways disease with exertion.

## 2020-07-30 NOTE — Progress Notes (Signed)
Emily Valentine, female    DOB: 11-Jun-1950      MRN: [id number]   Brief patient profile:  70 yo female active smoker remembers using inhalers  Frequently for asthma thru  age 44 did fine after that fine until  around 2020 developed doe and rec d/c cigs and rx prn albuterol.      History of Present Illness  07/30/2020  Pulmonary/ 1st office eval/ [facility] / [facility]  Chief Complaint  Patient presents with  . Pulmonary Consult    Referred by Dr. Sharilyn Sites for eval of COPD. Pt c/o DOE with climbing stairs for approx 2 years. She also c/o wheezing at night when she lies down. She has occ non prod cough.   Dyspnea: still can walk up to a 1/4 mile / hills are a problem  MMRC2 = can't walk a nl pace on a flat grade s sob but does fine slow and flat   Cough:  Minimal  Sleep: flat bed / one pillow under head  SABA use: daily   No obvious day to day or daytime variability or assoc excess/ purulent sputum or mucus plugs or hemoptysis or cp or chest tightness, subjective wheeze or overt sinus or hb symptoms.   Sleeping  without nocturnal  or early am exacerbation  of respiratory  c/o's or need for noct saba. Also denies any obvious fluctuation of symptoms with weather or environmental changes or other aggravating or alleviating factors except as outlined above   No unusual exposure hx or h/o childhood pna/ asthma or knowledge of premature birth.  Current Allergies, Complete Past Medical History, Past Surgical History, Family History, and Social History were reviewed in [employer] record.  ROS  The following are not active complaints unless bolded Hoarseness, sore throat, dysphagia, dental problems, itching, sneezing,  nasal congestion or discharge of excess mucus or purulent secretions, ear ache,   fever, chills, sweats, unintended wt loss or wt gain, classically pleuritic or exertional cp,  orthopnea pnd or arm/hand swelling  or leg swelling, presyncope,  palpitations, abdominal pain, anorexia, nausea, vomiting, diarrhea  or change in bowel habits or change in bladder habits, change in stools or change in urine, dysuria, hematuria,  rash, arthralgias, visual complaints, headache, numbness, weakness or ataxia or problems with walking or coordination,  change in mood or  memory.           Past Medical History:  Diagnosis Date  . History of echocardiogram    Echo (8/15): Vigorous LV function, EF 65-70%, normal wall motion, grade 1 diastolic dysfunction, mild to moderate TR, PASP 31 mm Hg  . Hx of cardiovascular stress test    ETT-Myoview (8/15): no ischemia, EF 56%, normal study  . Hypertension   . Hypothyroid   . Pernicious anemia   . PVC's (premature ventricular contractions)   . Right bundle branch block   . S/P colonoscopy 2001, 2004   2001: hyperplastic polyp, 2004: normal, inflammatory polyp    Outpatient Medications Prior to Visit  Medication Sig Dispense Refill  . acetaminophen (TYLENOL) 650 MG CR tablet Take 650 mg by mouth 2 (two) times daily as needed for pain.    Marland Kitchen aspirin EC 81 MG tablet Take 1 tablet (81 mg total) by mouth daily.    . Cyanocobalamin (VITAMIN B-12 IJ) Inject 1,000 mcg as directed every 30 (thirty) days.    . hydrochlorothiazide (HYDRODIURIL) 25 MG tablet TAKE (1) TABLET BY MOUTH ONCE DAILY. 90 tablet 3  .  hydrocortisone cream 1 % Apply 1 application topically daily as needed for itching.    . levothyroxine (SYNTHROID, LEVOTHROID) 88 MCG tablet Take 88 mcg by mouth daily before breakfast.    . losartan (COZAAR) 100 MG tablet TAKE ONE TABLET BY MOUTH ONCE DAILY. 60 tablet 1  . methotrexate 2.5 MG tablet Take 2.5 mg by mouth once a week. Take 7 tabs once per week    . metoprolol succinate (TOPROL-XL) 25 MG 24 hr tablet TAKE (1) TABLET BY MOUTH DAILY. 30 tablet 0  . Multiple Vitamins-Minerals (MULTIVITAMIN WITH MINERALS) tablet Take 1 tablet by mouth daily at 12 noon.     . nitroGLYCERIN (NITROSTAT) 0.4 MG SL  tablet Place 1 tablet (0.4 mg total) under the tongue every 5 (five) minutes as needed for chest pain. 25 tablet 1  . polyethylene glycol (MIRALAX / GLYCOLAX) packet Take 17 g by mouth daily as needed for mild constipation.    Marland Kitchen PROAIR HFA 108 (90 Base) MCG/ACT inhaler Inhale 108 mcg into the lungs as needed.    . Probiotic Product (PROBIOTIC ADVANCED) CAPS Take 1 capsule by mouth daily.    . traMADol (ULTRAM) 50 MG tablet Take 50 mg by mouth as needed.    . Wheat Dextrin (BENEFIBER) POWD Take 1 Dose by mouth as needed.     . Cholecalciferol (VITAMIN D3) 2000 units capsule Take 2,000 Units by mouth daily at 12 noon.    . folic acid (FOLVITE) 1 MG tablet Take 1 mg by mouth daily.     No facility-administered medications prior to visit.     Objective:     BP 124/90 (BP Location: Left Arm, Cuff Size: Normal)   Pulse 78   Temp 98.7 F (37.1 C) (Temporal)   Ht 5\' 1"  (1.549 m)   Wt 125 lb (56.7 kg)   SpO2 98% Comment: on RA  BMI 23.62 kg/m   SpO2: 98 % (on RA)   Somber bf nad  HEENT : pt wearing mask not removed for exam due to covid - 19 concerns.   NECK :  without JVD/Nodes/TM/ nl carotid upstrokes bilaterally   LUNGS: no acc muscle use,  Min barrel  contour chest wall with bilateral  slightly decreased bs s audible wheeze and  without cough on insp or exp maneuvers and min  Hyperresonant  to  percussion bilaterally     CV:  RRR  no s3 or murmur or increase in P2, and no edema   ABD:  soft and nontender with pos end  insp Hoover's  in the supine position. No bruits or organomegaly appreciated, bowel sounds nl  MS:   Nl gait/  ext warm without deformities, calf tenderness, cyanosis or clubbing No obvious joint restrictions   SKIN: warm and dry without lesions    NEURO:  alert, approp, nl sensorium with  no motor or cerebellar deficits apparent.         I personally reviewed images and agree with radiology impression as follows:   Chest CT LDSCT  08/15/19 1. Lung-RADS 2,  benign appearance or behavior. Continue annual screening with low-dose chest CT without contrast in 12 months. 2. Aortic atherosclerosis (ICD10-I70.0), coronary artery atherosclerosis and emphysema      Assessment   COPD  GOLD ?  / still smoking Active smoker - h/o probable childhood asthma - 07/30/2020  After extensive coaching inhaler device,  effectiveness =    90% with DPI > try anoro   Pt is Group B  in terms of symptom/risk and laba/lama therefore appropriate rx at this point >>>  anoro one click each am  And approp saba;  I spent extra time with pt today reviewing appropriate use of albuterol for prn use on exertion with the following points: 1) saba is for relief of sob that does not improve by walking a slower pace or resting but rather if the pt does not improve after trying this first. 2) If the pt is convinced, as many are, that saba helps recover from activity faster then it's easy to tell if this is the case by re-challenging : ie stop, take the inhaler, then p 5 minutes try the exact same activity (intensity of workload) that just caused the symptoms and see if they are substantially diminished or not after saba 3) if there is an activity that reproducibly causes the symptoms, try the saba 15 min before the activity on alternate days   If in fact the saba really does help, then fine to continue to use it prn but advised may need to look closer at the maintenance regimen being used to achieve better control of airways disease with exertion.       Cigarette smoker Counseled re importance of smoking cessation but did not meet time criteria for separate billing            Each maintenance medication was reviewed in detail including emphasizing most importantly the difference between maintenance and prns and under what circumstances the prns are to be triggered using an action plan format where appropriate.  Total time for H and P, chart review, counseling, reviewing elipta   device(s) and generating customized AVS unique to this office visit / same day charting = 45 min           Christinia Gully, MD 07/30/2020

## 2020-07-30 NOTE — Patient Instructions (Addendum)
Most important: The key is to stop smoking completely before smoking completely stops you!  Plan A = Automatic = Always=   Anoro one click each  am before your dental care .   Work on inhaler technique:  relax and gently blow all the way out then take a nice smooth deep breath back in thru    Hold for up to 5 seconds if you can.  . Rinse and gargle with water when done   Plan B = Backup (to supplement plan A, not to replace it) Only use your albuterol inhaler as a rescue medication to be used if you can't catch your breath by resting or doing a relaxed purse lip breathing pattern.  - The less you use it, the better it will work when you need it. - Ok to use the inhaler up to 2 puffs  every 4 hours if you must but call for appointment if use goes up over your usual need - Don't leave home without it !!  (think of it like the spare tire for your car)    Please schedule a follow up office visit in 6-8 weeks,   with pfts on return

## 2020-08-06 NOTE — Progress Notes (Signed)
Cardiology Office Note   Date:  08/09/2020   ID:  Cedar, Roseman [birth year], MRN [id number]  PCP:  Sharilyn Sites, MD  Cardiologist:  Dr. Johnsie Cancel     Chief Complaint  Patient presents with  . Irregular Heart Beat      History of Present Illness: Emily Valentine is a 70 y.o. female who presents for hx of PVCs chronic RBBB, tobacco use, atypical chest pain.   Last nuc and echo 2015 neg for ischemia and stable echo.   Last seen 07/21/19 and BP 130/92, no pain or palpitations. Still smoking though she did agree to nicotine patch  Today no chest pain and no SOB, rare lightheadedness. No syncope.  + COPD has seen pulmonary.  On inhalers.  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  No awareness of PVCs or any palpitations.    Has RA and followed by Rheumatology  Tobacco down to 4-5 cigarettes per day   Past Medical History:  Diagnosis Date  . History of echocardiogram    Echo (8/15): Vigorous LV function, EF 65-70%, normal wall motion, grade 1 diastolic dysfunction, mild to moderate TR, PASP 31 mm Hg  . Hx of cardiovascular stress test    ETT-Myoview (8/15): no ischemia, EF 56%, normal study  . Hypertension   . Hypothyroid   . Pernicious anemia   . PVC's (premature ventricular contractions)   . Right bundle branch block   . S/P colonoscopy 2001, 2004   2001: hyperplastic polyp, 2004: normal, inflammatory polyp    Past Surgical History:  Procedure Laterality Date  . ABDOMINAL HYSTERECTOMY    . BACK SURGERY    . BREAST BIOPSY Right 10/29/2010  . BREAST BIOPSY Right 11/27/2014  . BREAST EXCISIONAL BIOPSY Right 02/21/2015  . BREAST LUMPECTOMY WITH RADIOACTIVE SEED LOCALIZATION Right 02/21/2015   Procedure: RIGHT BREAST LUMPECTOMY WITH RADIOACTIVE SEED LOCALIZATION;  Surgeon: Rolm Bookbinder, MD;  Location: [facility];  Service: General;  Laterality: Right;  . COLONOSCOPY  11/10/2010   Procedure: COLONOSCOPY;  Surgeon: Daneil Dolin, MD;  [facility];  Service: Endoscopy;  Laterality: N/A;  . COLONOSCOPY N/A 09/28/2016   Dr. Emerson Monte: Diverticulosis with evidence of recent diverticulitis.  Next colonoscopy in 2023 given history of tubular adenomas in the past.  . THYROIDECTOMY       Current Outpatient Medications  Medication Sig Dispense Refill  . acetaminophen (TYLENOL) 650 MG CR tablet Take 650 mg by mouth 2 (two) times daily as needed for pain.    Marland Kitchen aspirin EC 81 MG tablet Take 1 tablet (81 mg total) by mouth daily.    . Cyanocobalamin (VITAMIN B-12 IJ) Inject 1,000 mcg as directed every 30 (thirty) days.    . hydrochlorothiazide (HYDRODIURIL) 25 MG tablet TAKE (1) TABLET BY MOUTH ONCE DAILY. 90 tablet 3  . hydrocortisone cream 1 % Apply 1 application topically daily as needed for itching.    . levothyroxine (SYNTHROID, LEVOTHROID) 88 MCG tablet Take 88 mcg by mouth daily before breakfast.    . losartan (COZAAR) 100 MG tablet TAKE ONE TABLET BY MOUTH ONCE DAILY. 60 tablet 1  . methotrexate 2.5 MG tablet Take 2.5 mg by mouth once a week. Take 7 tabs once per week    . metoprolol succinate (TOPROL-XL) 25 MG 24 hr tablet TAKE (1) TABLET BY MOUTH DAILY. 30 tablet 0  . Multiple Vitamins-Minerals (MULTIVITAMIN WITH MINERALS) tablet Take 1 tablet by mouth daily at 12 noon.     Marland Kitchen  nitroGLYCERIN (NITROSTAT) 0.4 MG SL tablet Place 1 tablet (0.4 mg total) under the tongue every 5 (five) minutes as needed for chest pain. 25 tablet 1  . polyethylene glycol (MIRALAX / GLYCOLAX) packet Take 17 g by mouth daily as needed for mild constipation.    Marland Kitchen PROAIR HFA 108 (90 Base) MCG/ACT inhaler Inhale 108 mcg into the lungs as needed.    . Probiotic Product (PROBIOTIC ADVANCED) CAPS Take 1 capsule by mouth daily.    . traMADol (ULTRAM) 50 MG tablet Take 50 mg by mouth as needed.    . umeclidinium-vilanterol (ANORO ELLIPTA) 62.5-25 MCG/INH AEPB Inhale 1 puff into the lungs daily. 30 each 11  . Wheat Dextrin (BENEFIBER) POWD Take 1 Dose by mouth as  needed.      No current facility-administered medications for this visit.    Allergies:   Codeine and Oxycontin [oxycodone]    Social History:  The patient  reports that she has been smoking cigarettes. She has a 35.00 pack-year smoking history. She has quit using smokeless tobacco. She reports that she does not drink alcohol and does not use drugs.   Family History:  The patient's family history includes Anemia in her father, mother, and sister; Cancer in her brother, father, and mother; Colon cancer in her father; Diabetes in her sister; Hypertension in her brother, father, mother, and sister; Stomach cancer in her mother; Thyroid disease in her brother, mother, and sister.    ROS:  General:no colds or fevers, no weight changes Skin:no rashes or ulcers HEENT:no blurred vision, no congestion CV:see HPI PUL:see HPI GI:no diarrhea constipation or melena, no indigestion GU:no hematuria, no dysuria MS:no joint pain, no claudication Neuro:no syncope, no lightheadedness Endo:no diabetes, + thyroid disease  Wt Readings from Last 3 Encounters:  08/09/20 126 lb (57.2 kg)  07/30/20 125 lb (56.7 kg)  01/22/20 125 lb (56.7 kg)     PHYSICAL EXAM: VS:  BP 112/78   Pulse 73   Ht 5\' 1"  (1.549 m)   Wt 126 lb (57.2 kg)   BMI 23.81 kg/m  , BMI Body mass index is 23.81 kg/m. General:Pleasant affect, NAD Skin:Warm and dry, brisk capillary refill HEENT:normocephalic, sclera clear, mucus membranes moist Neck:supple, no JVD, no bruits  Heart:S1S2 RRR without murmur, gallup, rub or click Lungs:occ wheeze without rales, rhonchi VOZ:DGUY, non tender, + BS, do not palpate liver spleen or masses Ext:no lower ext edema, 2+ pedal pulses, 2+ radial pulses Neuro:alert and oriented X 3, MAE, follows commands, + facial symmetry    EKG:  EKG is ordered today. The ekg ordered today demonstrates SR RBBB no changes stable.     Recent Labs: No results found for requested labs within last 8760 hours.     Lipid Panel    Component Value Date/Time   CHOL 151 10/16/2013 1023   TRIG 116.0 10/16/2013 1023   HDL 37.50 (L) 10/16/2013 1023   CHOLHDL 4 10/16/2013 1023   VLDL 23.2 10/16/2013 1023   LDLCALC 90 10/16/2013 1023       Other studies Reviewed: Additional studies/ records that were reviewed today include: .  nuc study 2015 neg for ischemia  Echo 10/2013  Study Conclusions   - Left ventricle: The cavity size was normal. Systolic function was  vigorous. The estimated ejection fraction was in the range of 65%  to 70%. Wall motion was normal; there were no regional wall  motion abnormalities. Doppler parameters are consistent with  abnormal left ventricular relaxation (grade 1 diastolic  dysfunction).  - Tricuspid valve: There was mild-moderate regurgitation.  - Pulmonary arteries: Systolic pressure was mildly increased. PA  peak pressure: 31 mm Hg (S).    ASSESSMENT AND PLAN:  1.  PVCs none recently that she is aware of, feels well.  2.  HTN well controlled continue meds and refill toprol and cozaar.    3. Tobacco use improving down to 4-5 cigarettes per day  4.  Chronic RBBB Follow up in 8-9 moths with Dr. Johnsie Cancel  Current medicines are reviewed with the patient today.  The patient Has no concerns regarding medicines.  The following changes have been made:  See above Labs/ tests ordered today include:see above  Disposition:   FU:  see above  Signed, Cecilie Kicks, NP  08/09/2020 9:04 AM    [address], [employer], [place], [place] Phone: [phone]; Fax: [phone]

## 2020-08-09 ENCOUNTER — Other Ambulatory Visit: Payer: Self-pay

## 2020-08-09 ENCOUNTER — Ambulatory Visit: Payer: Medicare PPO | Admitting: Cardiology

## 2020-08-09 ENCOUNTER — Encounter: Payer: Self-pay | Admitting: Cardiology

## 2020-08-09 VITALS — BP 112/78 | HR 73 | Ht 61.0 in | Wt 126.0 lb

## 2020-08-09 DIAGNOSIS — I493 Ventricular premature depolarization: Secondary | ICD-10-CM

## 2020-08-09 DIAGNOSIS — I10 Essential (primary) hypertension: Secondary | ICD-10-CM

## 2020-08-09 DIAGNOSIS — I4510 Unspecified right bundle-branch block: Secondary | ICD-10-CM

## 2020-08-09 DIAGNOSIS — Z720 Tobacco use: Secondary | ICD-10-CM | POA: Diagnosis not present

## 2020-08-09 MED ORDER — LOSARTAN POTASSIUM 100 MG PO TABS: 1.0000 | ORAL_TABLET | Freq: Every day | ORAL | 3 refills | Status: DC

## 2020-08-09 MED ORDER — NITROGLYCERIN 0.4 MG SL SUBL: 0.4000 mg | SUBLINGUAL_TABLET | SUBLINGUAL | 3 refills | Status: DC | PRN

## 2020-08-09 MED ORDER — METOPROLOL SUCCINATE ER 25 MG PO TB24: ORAL_TABLET | ORAL | 3 refills | Status: DC

## 2020-08-09 NOTE — Patient Instructions (Signed)
Medication Instructions:  Your physician recommends that you continue on your current medications as directed. Please refer to the Current Medication list given to you today.  *If you need a refill on your cardiac medications before your next appointment, please call your pharmacy*   Lab Work: None ordered   If you have labs (blood work) drawn today and your tests are completely normal, you will receive your results only by: Marland Kitchen MyChart Message (if you have MyChart) OR . A paper copy in the mail If you have any lab test that is abnormal or we need to change your treatment, we will call you to review the results.   Testing/Procedures: None ordered   Follow-Up: At [facility], you and your health needs are our priority.  As part of our continuing mission to provide you with exceptional heart care, we have created designated Provider Care Teams.  These Care Teams include your primary Cardiologist (physician) and Advanced Practice Providers (APPs -  Physician Assistants and Nurse Practitioners) who all work together to provide you with the care you need, when you need it.  We recommend signing up for the patient portal called "MyChart".  Sign up information is provided on this After Visit Summary.  MyChart is used to connect with patients for Virtual Visits (Telemedicine).  Patients are able to view lab/test results, encounter notes, upcoming appointments, etc.  Non-urgent messages can be sent to your provider as well.   To learn more about what you can do with MyChart, go to [email or web address].    Your next appointment:   6-8 month(s)  The format for your next appointment:   In Person  Provider:   You may see Jenkins Rouge, MD or one of the following Advanced Practice Providers on your designated Care Team:    Kathyrn Drown, NP    Other Instructions None

## 2020-08-13 ENCOUNTER — Institutional Professional Consult (permissible substitution): Payer: Medicare PPO | Admitting: Internal Medicine

## 2020-08-26 ENCOUNTER — Other Ambulatory Visit (HOSPITAL_COMMUNITY)
Admission: RE | Admit: 2020-08-26 | Discharge: 2020-08-26 | Disposition: A | Discharge status: Home / Self Care | Payer: Medicare PPO | Source: Ambulatory Visit | Attending: Internal Medicine | Admitting: Internal Medicine

## 2020-08-26 ENCOUNTER — Other Ambulatory Visit (HOSPITAL_COMMUNITY): Payer: Self-pay | Admitting: Emergency Medicine

## 2020-08-26 ENCOUNTER — Other Ambulatory Visit: Payer: Self-pay

## 2020-08-26 DIAGNOSIS — Z20822 Contact with and (suspected) exposure to covid-19: Secondary | ICD-10-CM | POA: Diagnosis not present

## 2020-08-26 DIAGNOSIS — F17210 Nicotine dependence, cigarettes, uncomplicated: Secondary | ICD-10-CM | POA: Diagnosis not present

## 2020-08-26 DIAGNOSIS — R0600 Dyspnea, unspecified: Secondary | ICD-10-CM | POA: Insufficient documentation

## 2020-08-27 ENCOUNTER — Encounter: Payer: Self-pay | Admitting: *Deleted

## 2020-08-27 ENCOUNTER — Other Ambulatory Visit (HOSPITAL_COMMUNITY)
Admission: RE | Admit: 2020-08-27 | Discharge: 2020-08-27 | Disposition: A | Discharge status: Home / Self Care | Payer: Medicare PPO | Source: Ambulatory Visit | Attending: Internal Medicine | Admitting: Internal Medicine

## 2020-08-27 DIAGNOSIS — R0600 Dyspnea, unspecified: Secondary | ICD-10-CM | POA: Diagnosis not present

## 2020-08-27 DIAGNOSIS — F17210 Nicotine dependence, cigarettes, uncomplicated: Secondary | ICD-10-CM | POA: Diagnosis not present

## 2020-08-27 DIAGNOSIS — R0609 Other forms of dyspnea: Secondary | ICD-10-CM

## 2020-08-27 DIAGNOSIS — Z20822 Contact with and (suspected) exposure to covid-19: Secondary | ICD-10-CM | POA: Diagnosis not present

## 2020-08-27 LAB — PULMONARY FUNCTION TEST
DL/VA % pred: 59 %
DL/VA: 2.51 ml/min/mmHg/L
DLCO unc % pred: 30 %
DLCO unc: 5.27 ml/min/mmHg
FEF 25-75 Post: 0.3 L/sec
FEF 25-75 Pre: 0.39 L/sec
FEF2575-%Change-Post: -23 %
FEF2575-%Pred-Post: 20 %
FEF2575-%Pred-Pre: 26 %
FEV1-%Change-Post: -12 %
FEV1-%Pred-Post: 54 %
FEV1-%Pred-Pre: 61 %
FEV1-Post: 0.85 L
FEV1-Pre: 0.97 L
FEV1FVC-%Change-Post: -6 %
FEV1FVC-%Pred-Pre: 64 %
FEV6-%Change-Post: -6 %
FEV6-%Pred-Post: 89 %
FEV6-%Pred-Pre: 95 %
FEV6-Post: 1.74 L
FEV6-Pre: 1.86 L
FEV6FVC-%Change-Post: 0 %
FEV6FVC-%Pred-Post: 99 %
FEV6FVC-%Pred-Pre: 100 %
FVC-%Change-Post: -6 %
FVC-%Pred-Post: 89 %
FVC-%Pred-Pre: 95 %
FVC-Post: 1.82 L
FVC-Pre: 1.94 L
Post FEV1/FVC ratio: 47 %
Post FEV6/FVC ratio: 95 %
Pre FEV1/FVC ratio: 50 %
Pre FEV6/FVC Ratio: 96 %
RV % pred: 268 %
RV: 5.46 L
TLC % pred: 158 %
TLC: 7.32 L

## 2020-08-27 LAB — SARS CORONAVIRUS 2 (TAT 6-24 HRS): SARS Coronavirus 2: NEGATIVE

## 2020-08-27 MED ORDER — ALBUTEROL SULFATE (2.5 MG/3ML) 0.083% IN NEBU
2.5000 mg | INHALATION_SOLUTION | Freq: Once | RESPIRATORY_TRACT | Status: AC
  Administered 2020-08-27: 15:00:00 2.5 mg via RESPIRATORY_TRACT

## 2020-09-02 NOTE — Progress Notes (Signed)
Spoke with pt and notified of results per Dr. Wert. Pt verbalized understanding and denied any questions. 

## 2020-09-04 DIAGNOSIS — M549 Dorsalgia, unspecified: Secondary | ICD-10-CM | POA: Diagnosis not present

## 2020-09-04 DIAGNOSIS — J449 Chronic obstructive pulmonary disease, unspecified: Secondary | ICD-10-CM | POA: Diagnosis not present

## 2020-09-04 DIAGNOSIS — M25559 Pain in unspecified hip: Secondary | ICD-10-CM | POA: Diagnosis not present

## 2020-09-04 DIAGNOSIS — M059 Rheumatoid arthritis with rheumatoid factor, unspecified: Secondary | ICD-10-CM | POA: Diagnosis not present

## 2020-09-04 DIAGNOSIS — M1990 Unspecified osteoarthritis, unspecified site: Secondary | ICD-10-CM | POA: Diagnosis not present

## 2020-09-04 DIAGNOSIS — M8580 Other specified disorders of bone density and structure, unspecified site: Secondary | ICD-10-CM | POA: Diagnosis not present

## 2020-09-04 DIAGNOSIS — Z79899 Other long term (current) drug therapy: Secondary | ICD-10-CM | POA: Diagnosis not present

## 2020-09-05 DIAGNOSIS — J449 Chronic obstructive pulmonary disease, unspecified: Secondary | ICD-10-CM | POA: Diagnosis not present

## 2020-09-05 DIAGNOSIS — I10 Essential (primary) hypertension: Secondary | ICD-10-CM | POA: Diagnosis not present

## 2020-09-05 DIAGNOSIS — E559 Vitamin D deficiency, unspecified: Secondary | ICD-10-CM | POA: Diagnosis not present

## 2020-09-05 DIAGNOSIS — E039 Hypothyroidism, unspecified: Secondary | ICD-10-CM | POA: Diagnosis not present

## 2020-09-05 DIAGNOSIS — N62 Hypertrophy of breast: Secondary | ICD-10-CM | POA: Diagnosis not present

## 2020-09-05 DIAGNOSIS — Z1389 Encounter for screening for other disorder: Secondary | ICD-10-CM | POA: Diagnosis not present

## 2020-09-05 DIAGNOSIS — Z0001 Encounter for general adult medical examination with abnormal findings: Secondary | ICD-10-CM | POA: Diagnosis not present

## 2020-09-05 DIAGNOSIS — D510 Vitamin B12 deficiency anemia due to intrinsic factor deficiency: Secondary | ICD-10-CM | POA: Diagnosis not present

## 2020-09-05 DIAGNOSIS — Z6822 Body mass index (BMI) 22.0-22.9, adult: Secondary | ICD-10-CM | POA: Diagnosis not present

## 2020-09-05 DIAGNOSIS — Z720 Tobacco use: Secondary | ICD-10-CM | POA: Diagnosis not present

## 2020-09-18 ENCOUNTER — Other Ambulatory Visit (HOSPITAL_COMMUNITY): Payer: Self-pay | Admitting: Family Medicine

## 2020-09-18 DIAGNOSIS — F17210 Nicotine dependence, cigarettes, uncomplicated: Secondary | ICD-10-CM

## 2020-10-02 ENCOUNTER — Other Ambulatory Visit: Payer: Self-pay | Admitting: Cardiology

## 2020-10-04 ENCOUNTER — Other Ambulatory Visit: Payer: Self-pay

## 2020-10-04 ENCOUNTER — Ambulatory Visit (HOSPITAL_COMMUNITY)
Admission: RE | Admit: 2020-10-04 | Discharge: 2020-10-04 | Disposition: A | Discharge status: Home / Self Care | Payer: Medicare PPO | Source: Ambulatory Visit | Attending: Family Medicine | Admitting: Family Medicine

## 2020-10-04 DIAGNOSIS — F17210 Nicotine dependence, cigarettes, uncomplicated: Secondary | ICD-10-CM | POA: Insufficient documentation

## 2020-11-05 NOTE — Progress Notes (Signed)
Referring Provider: Sharilyn Sites, MD Primary Care Physician:  Sharilyn Sites, MD Primary GI Physician: Dr. Gala Romney  Chief Complaint  Patient presents with   Follow-up    Bowel movements are not regular and feels like she doesn't get it all out.    HPI:   Emily Valentine is a 70 y.o. female presenting today for follow-up of constipation.  History of diverticulitis and colonic polyps.  Last colonoscopy July 2018 with diverticulosis with evidence of recent diverticulitis, recommended repeat in 2023 due to history of tubular adenomas in the past.  Last seen in our office November 2021.  Bms twice a week which was her baseline, but felt her bowels should be moving more frequently.  Using Benefiber daily and MiraLAX a couple times a week.  Intermittent left-sided abdominal discomfort possibly secondary to gas.  No other significant GI symptoms.  Recommended continuing Benefiber, increase MiraLAX to 1 capful daily, try adding daily probiotic, avoid gas producing items, follow-up in 6 months.  Today: Bms 1-2 times a week. Incomplete. Not hard. Tried MiraLAX daily for about 1 week. Stools were "smeary". Now only taking MiraLAX if she hasn't moved her bowels in several days.  Continues to take Benefiber daily.  Occasional lower abdominal discomfort if she has skipped days between Bms. Improves with BM. No blood in the stool or black stool. Weight is stable.   2 bottles of water daily.  Occasional fruit and vegetables.   Eating well. No nausea, vomiting, GERD or dysphagia.   Past Medical History:  Diagnosis Date   Diverticulitis 08/2016   History of echocardiogram    Echo (8/15): Vigorous LV function, EF 65-70%, normal wall motion, grade 1 diastolic dysfunction, mild to moderate TR, PASP 31 mm Hg   Hx of cardiovascular stress test    ETT-Myoview (8/15): no ischemia, EF 56%, normal study   Hypertension    Hypothyroid    Pernicious anemia    PVC's (premature ventricular contractions)     Right bundle branch block    S/P colonoscopy 2001, 2004   2001: hyperplastic polyp, 2004: normal, inflammatory polyp    Past Surgical History:  Procedure Laterality Date   ABDOMINAL HYSTERECTOMY     BACK SURGERY     BREAST BIOPSY Right 10/29/2010   BREAST BIOPSY Right 11/27/2014   BREAST EXCISIONAL BIOPSY Right 02/21/2015   BREAST LUMPECTOMY WITH RADIOACTIVE SEED LOCALIZATION Right 02/21/2015   Procedure: RIGHT BREAST LUMPECTOMY WITH RADIOACTIVE SEED LOCALIZATION;  Surgeon: Rolm Bookbinder, MD;  Location: [facility];  Service: General;  Laterality: Right;   COLONOSCOPY  11/10/2010   Procedure: COLONOSCOPY;  Surgeon: Daneil Dolin, MD;  [facility];  Service: Endoscopy;  Laterality: N/A;   COLONOSCOPY N/A 09/28/2016   Dr. Gala Romney; Diverticulosis with evidence of recent diverticulitis.  Next colonoscopy in 2023 given history of tubular adenomas in the past.   THYROIDECTOMY      Current Outpatient Medications  Medication Sig Dispense Refill   acetaminophen (TYLENOL) 650 MG CR tablet Take 650 mg by mouth 2 (two) times daily as needed for pain.     aspirin EC 81 MG tablet Take 1 tablet (81 mg total) by mouth daily.     Cyanocobalamin (VITAMIN B-12 IJ) Inject 1,000 mcg as directed every 30 (thirty) days.     hydrochlorothiazide (HYDRODIURIL) 25 MG tablet TAKE (1) TABLET BY MOUTH ONCE DAILY. 90 tablet 0   hydrocortisone cream 1 % Apply 1 application topically daily as needed for itching.  levothyroxine (SYNTHROID, LEVOTHROID) 88 MCG tablet Take 88 mcg by mouth daily before breakfast.     losartan (COZAAR) 100 MG tablet Take 1 tablet (100 mg total) by mouth daily. 90 tablet 3   methotrexate 2.5 MG tablet Take 2.5 mg by mouth once a week. Take 7 tabs once per week     metoprolol succinate (TOPROL-XL) 25 MG 24 hr tablet TAKE (1) TABLET BY MOUTH DAILY. 90 tablet 3   Multiple Vitamins-Minerals (MULTIVITAMIN WITH MINERALS) tablet Take 1 tablet by mouth daily at 12  noon.      nitroGLYCERIN (NITROSTAT) 0.4 MG SL tablet Place 1 tablet (0.4 mg total) under the tongue every 5 (five) minutes as needed for chest pain. 25 tablet 3   Plecanatide (TRULANCE) 3 MG TABS Take 3 mg by mouth daily. 30 tablet 3   polyethylene glycol (MIRALAX / GLYCOLAX) packet Take 17 g by mouth daily as needed for mild constipation.     PROAIR HFA 108 (90 Base) MCG/ACT inhaler Inhale 108 mcg into the lungs as needed.     Probiotic Product (PROBIOTIC ADVANCED) CAPS Take 1 capsule by mouth daily.     traMADol (ULTRAM) 50 MG tablet Take 50 mg by mouth as needed.     umeclidinium-vilanterol (ANORO ELLIPTA) 62.5-25 MCG/INH AEPB Inhale 1 puff into the lungs daily. 30 each 11   Wheat Dextrin (BENEFIBER) POWD Take 1 Dose by mouth as needed.      No current facility-administered medications for this visit.    Allergies as of 11/06/2020 - Review Complete 11/06/2020  Allergen Reaction Noted   Codeine Nausea Only 08/24/2016   Oxycontin [oxycodone] Nausea And Vomiting 09/01/2011    Family History  Problem Relation Age of Onset   Stomach cancer Mother        deceased   Anemia Mother    Cancer Mother    Hypertension Mother    Thyroid disease Mother    Colon cancer Father        diagnosed age 61, died at age 55   Anemia Father    Cancer Father    Hypertension Father    Anemia Sister    Cancer Brother    Diabetes Sister    Hypertension Sister    Hypertension Brother    Thyroid disease Brother    Thyroid disease Sister     Social History   Socioeconomic History   Marital status: Married    Spouse name: Not on file   Number of children: Not on file   Years of education: Not on file   Highest education level: Not on file  Occupational History   Not on file  Tobacco Use   Smoking status: Every Day    Packs/day: 1.00    Years: 35.00    Pack years: 35.00    Types: Cigarettes   Smokeless tobacco: Former   Tobacco comments:    3-4 cigs per day 07/30/20//lmr  Vaping Use    Vaping Use: Former  Substance and Sexual Activity   Alcohol use: No   Drug use: No   Sexual activity: Yes    Birth control/protection: Surgical  Other Topics Concern   Not on file  Social History Narrative   Not on file   Social Determinants of Health   Financial Resource Strain: Medium Risk   Difficulty of Paying Living Expenses: Somewhat hard  Food Insecurity: No Food Insecurity   Worried About Running Out of Food in the Last Year: Never true   Ran  Out of Food in the Last Year: Never true  Transportation Needs: No Transportation Needs   Lack of Transportation (Medical): No   Lack of Transportation (Non-Medical): No  Physical Activity: Insufficiently Active   Days of Exercise per Week: 1 day   Minutes of Exercise per Session: 60 min  Stress: Stress Concern Present   Feeling of Stress : To some extent  Social Connections: Engineer, building services of Communication with Friends and Family: More than three times a week   Frequency of Social Gatherings with Friends and Family: Once a week   Attends Religious Services: More than 4 times per year   Active Member of [employer] or Organizations: Yes   Attends Music therapist: More than 4 times per year   Marital Status: Married    Review of Systems: Gen: Denies fever, chills, cold or flulike symptoms, presyncope, syncope. CV: Denies chest pain, palpitations Resp: Denies dyspnea or cough GI: See HPI  Heme: See HPI  Physical Exam: BP 125/72   Pulse 86   Temp (!) 97 F (36.1 C) (Temporal)   Ht '5\' 2"'$  (1.575 m)   Wt 124 lb 6.4 oz (56.4 kg)   BMI 22.75 kg/m  General:   Alert and oriented. No distress noted. Pleasant and cooperative.  Head:  Normocephalic and atraumatic. Eyes:  Conjuctiva clear without scleral icterus. Heart:  S1, S2 present without murmurs appreciated. Lungs:  Clear to auscultation bilaterally. No wheezes, rales, or rhonchi. No distress.  Abdomen:  +BS, soft, non-tender and non-distended. No  rebound or guarding. No HSM or masses noted. Msk:  Symmetrical without gross deformities. Normal posture. Extremities:  Without edema. Neurologic:  Alert and  oriented x4 Psych:  Normal mood and affect.    Assessment: 70 year old female with history of chronic constipation, diverticulitis June 2018, adenomatous colon polyps presenting today for follow-up.  Constipation: Not adequately managed.  Trial of MiraLAX daily caused "smeary" stools.  Now only taking Benefiber daily with bowel movements once to twice weekly, incomplete.  Associated mild abdominal discomfort that improves with bowel movements.  No alarm symptoms.  Tried and failed Linzess 290 mcg a couple years ago.  We will plan to try her on Trulance 3 mg daily as she prefers once a day dosing.   History of adenomatous colon polyps: Due for surveillance colonoscopy July 2023.   Plan: 1.  Stop MiraLAX and start Trulance 3 mg daily. 2.  Continue Benefiber daily. 3.  Drink 4-5 bottles of water daily. 4.  Requested progress report in 2 to 4 weeks. 5.  Follow-up in 4 months.    Aliene Altes, PA-C [facility] 11/06/2020

## 2020-11-06 ENCOUNTER — Other Ambulatory Visit: Payer: Self-pay

## 2020-11-06 ENCOUNTER — Telehealth: Payer: Self-pay | Admitting: Gastroenterology

## 2020-11-06 ENCOUNTER — Ambulatory Visit: Payer: Medicare PPO | Admitting: Gastroenterology

## 2020-11-06 ENCOUNTER — Encounter: Payer: Self-pay | Admitting: Gastroenterology

## 2020-11-06 VITALS — BP 125/72 | HR 86 | Temp 97.0°F | Ht 62.0 in | Wt 124.4 lb

## 2020-11-06 DIAGNOSIS — K5900 Constipation, unspecified: Secondary | ICD-10-CM

## 2020-11-06 MED ORDER — TRULANCE 3 MG PO TABS: 3.0000 mg | ORAL_TABLET | Freq: Every day | ORAL | 3 refills | Status: DC

## 2020-11-06 NOTE — Telephone Encounter (Signed)
PA for Trulance 3 mg capsule has been approved. Coverage begins 03/16/2020 - 03/15/2021. PA case: [id number].

## 2020-11-06 NOTE — Patient Instructions (Signed)
Stop MiraLAX and start Trulance 3 mg daily.  I have sent a prescription to your pharmacy.  Please let us know if you are not able to afford this medication.  You may continue Benefiber while taking Trulance.  Increase your water intake. Try drinking 4-5 bottles of water daily.   Call with a progress report in 2 to 4 weeks.   We will plan to see you in the office in about 4 months.  However, we can make medication adjustments over the telephone if needed.  It was great to see you again today!  Aliene Altes, PA-C [facility]

## 2020-11-14 DIAGNOSIS — U071 COVID-19: Secondary | ICD-10-CM

## 2020-11-14 HISTORY — DX: COVID-19: U07.1

## 2020-11-18 ENCOUNTER — Emergency Department (HOSPITAL_COMMUNITY): Payer: Medicare PPO

## 2020-11-18 ENCOUNTER — Other Ambulatory Visit: Payer: Self-pay

## 2020-11-18 ENCOUNTER — Emergency Department (HOSPITAL_COMMUNITY)
Admission: EM | Admit: 2020-11-18 | Discharge: 2020-11-18 | Disposition: A | Discharge status: Home / Self Care | Payer: Medicare PPO | Attending: Student | Admitting: Student

## 2020-11-18 ENCOUNTER — Encounter (HOSPITAL_COMMUNITY): Payer: Self-pay

## 2020-11-18 DIAGNOSIS — I700 Atherosclerosis of aorta: Secondary | ICD-10-CM | POA: Diagnosis not present

## 2020-11-18 DIAGNOSIS — J449 Chronic obstructive pulmonary disease, unspecified: Secondary | ICD-10-CM | POA: Diagnosis not present

## 2020-11-18 DIAGNOSIS — U071 COVID-19: Secondary | ICD-10-CM | POA: Insufficient documentation

## 2020-11-18 DIAGNOSIS — F17210 Nicotine dependence, cigarettes, uncomplicated: Secondary | ICD-10-CM | POA: Diagnosis not present

## 2020-11-18 DIAGNOSIS — I10 Essential (primary) hypertension: Secondary | ICD-10-CM | POA: Diagnosis not present

## 2020-11-18 DIAGNOSIS — K8020 Calculus of gallbladder without cholecystitis without obstruction: Secondary | ICD-10-CM | POA: Diagnosis not present

## 2020-11-18 DIAGNOSIS — Z7982 Long term (current) use of aspirin: Secondary | ICD-10-CM | POA: Insufficient documentation

## 2020-11-18 DIAGNOSIS — R519 Headache, unspecified: Secondary | ICD-10-CM | POA: Diagnosis not present

## 2020-11-18 DIAGNOSIS — E039 Hypothyroidism, unspecified: Secondary | ICD-10-CM | POA: Insufficient documentation

## 2020-11-18 DIAGNOSIS — K5792 Diverticulitis of intestine, part unspecified, without perforation or abscess without bleeding: Secondary | ICD-10-CM | POA: Insufficient documentation

## 2020-11-18 DIAGNOSIS — Z79899 Other long term (current) drug therapy: Secondary | ICD-10-CM | POA: Diagnosis not present

## 2020-11-18 DIAGNOSIS — N390 Urinary tract infection, site not specified: Secondary | ICD-10-CM | POA: Insufficient documentation

## 2020-11-18 DIAGNOSIS — R531 Weakness: Secondary | ICD-10-CM | POA: Diagnosis not present

## 2020-11-18 DIAGNOSIS — M4316 Spondylolisthesis, lumbar region: Secondary | ICD-10-CM | POA: Diagnosis not present

## 2020-11-18 LAB — CBC WITH DIFFERENTIAL/PLATELET
Abs Immature Granulocytes: 0.02 10*3/uL (ref 0.00–0.07)
Basophils Absolute: 0 10*3/uL (ref 0.0–0.1)
Basophils Relative: 0 %
Eosinophils Absolute: 0 10*3/uL (ref 0.0–0.5)
Eosinophils Relative: 0 %
HCT: 43.9 % (ref 36.0–46.0)
Hemoglobin: 15 g/dL (ref 12.0–15.0)
Immature Granulocytes: 0 %
Lymphocytes Relative: 25 %
Lymphs Abs: 1.3 10*3/uL (ref 0.7–4.0)
MCH: 33.1 pg (ref 26.0–34.0)
MCHC: 34.2 g/dL (ref 30.0–36.0)
MCV: 96.9 fL (ref 80.0–100.0)
Monocytes Absolute: 1.1 10*3/uL — ABNORMAL HIGH (ref 0.1–1.0)
Monocytes Relative: 22 %
Neutro Abs: 2.7 10*3/uL (ref 1.7–7.7)
Neutrophils Relative %: 53 %
Platelets: 185 10*3/uL (ref 150–400)
RBC: 4.53 MIL/uL (ref 3.87–5.11)
RDW: 14.6 % (ref 11.5–15.5)
WBC: 5.2 10*3/uL (ref 4.0–10.5)
nRBC: 0 % (ref 0.0–0.2)

## 2020-11-18 LAB — COMPREHENSIVE METABOLIC PANEL
ALT: 31 U/L (ref 0–44)
AST: 51 U/L — ABNORMAL HIGH (ref 15–41)
Albumin: 3.9 g/dL (ref 3.5–5.0)
Alkaline Phosphatase: 75 U/L (ref 38–126)
Anion gap: 8 (ref 5–15)
BUN: 25 mg/dL — ABNORMAL HIGH (ref 8–23)
CO2: 29 mmol/L (ref 22–32)
Calcium: 9.1 mg/dL (ref 8.9–10.3)
Chloride: 99 mmol/L (ref 98–111)
Creatinine, Ser: 1.56 mg/dL — ABNORMAL HIGH (ref 0.44–1.00)
GFR, Estimated: 36 mL/min — ABNORMAL LOW (ref >60)
Glucose, Bld: 102 mg/dL — ABNORMAL HIGH (ref 70–99)
Potassium: 3.2 mmol/L — ABNORMAL LOW (ref 3.5–5.1)
Sodium: 136 mmol/L (ref 135–145)
Total Bilirubin: 0.8 mg/dL (ref 0.3–1.2)
Total Protein: 8.4 g/dL — ABNORMAL HIGH (ref 6.5–8.1)

## 2020-11-18 LAB — URINALYSIS, ROUTINE W REFLEX MICROSCOPIC
Bilirubin Urine: NEGATIVE
Glucose, UA: NEGATIVE mg/dL
Ketones, ur: NEGATIVE mg/dL
Nitrite: NEGATIVE
Specific Gravity, Urine: 1.015 (ref 1.005–1.030)
pH: 5.5 (ref 5.0–8.0)

## 2020-11-18 LAB — URINALYSIS, MICROSCOPIC (REFLEX)
RBC / HPF: >50 RBC/hpf (ref 0–5)
Squamous Epithelial / HPF: >50 (ref 0–5)

## 2020-11-18 LAB — TSH: TSH: 0.344 u[IU]/mL — ABNORMAL LOW (ref 0.350–4.500)

## 2020-11-18 LAB — POC SARS CORONAVIRUS 2 AG -  ED: SARS Coronavirus 2 Ag: POSITIVE — AB

## 2020-11-18 MED ORDER — ONDANSETRON HCL 4 MG PO TABS
4.0000 mg | ORAL_TABLET | Freq: Once | ORAL | Status: AC
  Administered 2020-11-18: 4 mg via ORAL
  Filled 2020-11-18: qty 1

## 2020-11-18 MED ORDER — NIRMATRELVIR/RITONAVIR (PAXLOVID) TABLET (RENAL DOSING): 2.0000 | ORAL_TABLET | Freq: Two times a day (BID) | ORAL | 0 refills | Status: AC

## 2020-11-18 MED ORDER — CEFADROXIL 500 MG PO CAPS
500.0000 mg | ORAL_CAPSULE | Freq: Two times a day (BID) | ORAL | 0 refills | Status: DC
Start: 2020-11-18 — End: 2020-12-17

## 2020-11-18 NOTE — ED Notes (Signed)
Checked on pt. Completed  Vitals and saved and stored EKG

## 2020-11-18 NOTE — ED Provider Notes (Signed)
[facility] EMERGENCY DEPARTMENT Provider Note   CSN: [id number] Arrival date & time: 11/18/20  [id number]     History Chief Complaint  Patient presents with   Weakness    Generalized Weakness    Emily Valentine is a 70 y.o. female with past medical history of hypertension, hypothyroidism, COPD, and arthritis presents to the ED for evaluation of weakness and fatigue for 5 days.  She reports a decrease in appetite, but no nausea, vomiting, or abdominal pain.  She denies any syncope, fever, chest pain, shortness of breath, change in activity, dark stools, dysuria, hematuria, or hematochezia.  The patient reports a mild headache that was relieved with Tylenol and has not had one since.  The patient mentions that she also stopped smoking 5 days ago as well.  She denies any new medication or changes to medications.  She denies any surgeries, medication allergies, or new medical conditions.   Weakness Associated symptoms: headaches   Associated symptoms: no abdominal pain, no diarrhea, no dysuria, no nausea and no vomiting       Past Medical History:  Diagnosis Date   Diverticulitis 08/2016   History of echocardiogram    Echo (8/15): Vigorous LV function, EF 65-70%, normal wall motion, grade 1 diastolic dysfunction, mild to moderate TR, PASP 31 mm Hg   Hx of cardiovascular stress test    ETT-Myoview (8/15): no ischemia, EF 56%, normal study   Hypertension    Hypothyroid    Pernicious anemia    PVC's (premature ventricular contractions)    Right bundle branch block    S/P colonoscopy 2001, 2004   2001: hyperplastic polyp, 2004: normal, inflammatory polyp    Patient Active Problem List   Diagnosis Date Noted   COPD  GOLD 2  / still smoking 07/30/2020   Constipation 04/19/2018   Memory changes 06/26/2017   Mass of right breast 01/17/2015   Perimenopausal vasomotor symptoms 09/05/2014   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   Palpitations 10/04/2013   Right bundle branch block 08/28/2011    Cigarette smoker 08/28/2011   Essential hypertension 08/28/2011   Encounter for screening colonoscopy 10/14/2010    Past Surgical History:  Procedure Laterality Date   ABDOMINAL HYSTERECTOMY     BACK SURGERY     BREAST BIOPSY Right 10/29/2010   BREAST BIOPSY Right 11/27/2014   BREAST EXCISIONAL BIOPSY Right 02/21/2015   BREAST LUMPECTOMY WITH RADIOACTIVE SEED LOCALIZATION Right 02/21/2015   Procedure: RIGHT BREAST LUMPECTOMY WITH RADIOACTIVE SEED LOCALIZATION;  Surgeon: Rolm Bookbinder, MD;  Location: [facility];  Service: General;  Laterality: Right;   COLONOSCOPY  11/10/2010   Procedure: COLONOSCOPY;  Surgeon: Daneil Dolin, MD;  Location: [facility];  Service: Endoscopy;  Laterality: N/A;   COLONOSCOPY N/A 09/28/2016   Dr. Gala Romney; Diverticulosis with evidence of recent diverticulitis.  Next colonoscopy in 2023 given history of tubular adenomas in the past.   THYROIDECTOMY       OB History     Gravida  3   Para  3   Term      Preterm      AB      Living  3      SAB      IAB      Ectopic      Multiple      Live Births              Family History  Problem Relation Age of Onset   Stomach cancer Mother  deceased   Anemia Mother    Cancer Mother    Hypertension Mother    Thyroid disease Mother    Colon cancer Father        diagnosed age 87, died at age 14   Anemia Father    Cancer Father    Hypertension Father    Anemia Sister    Cancer Brother    Diabetes Sister    Hypertension Sister    Hypertension Brother    Thyroid disease Brother    Thyroid disease Sister     Social History   Tobacco Use   Smoking status: Every Day    Packs/day: 1.00    Years: 35.00    Pack years: 35.00    Types: Cigarettes   Smokeless tobacco: Former   Tobacco comments:    3-4 cigs per day 07/30/20//lmr  Vaping Use   Vaping Use: Former  Substance Use Topics   Alcohol use: No   Drug use: No    Home Medications Prior to Admission  medications   Medication Sig Start Date End Date Taking? Authorizing Provider  cefadroxil (DURICEF) 500 MG capsule Take 1 capsule (500 mg total) by mouth 2 (two) times daily. 11/18/20  Yes Sherrell Puller, PA-C  nirmatrelvir/ritonavir EUA, renal dosing, (PAXLOVID) 10 x 150 MG & 10 x '100MG'$  TABS Take 2 tablets by mouth 2 (two) times daily for 5 days. Patient GFR is 36. Take nirmatrelvir (150 mg) one tablet twice daily for 5 days and ritonavir (100 mg) one tablet twice daily for 5 days. 11/18/20 11/23/20 Yes Sherrell Puller, PA-C  acetaminophen (TYLENOL) 650 MG CR tablet Take 650 mg by mouth 2 (two) times daily as needed for pain.    [provider]  aspirin EC 81 MG tablet Take 1 tablet (81 mg total) by mouth daily. 10/04/13   Richardson Dopp T, PA-C  Cyanocobalamin (VITAMIN B-12 IJ) Inject 1,000 mcg as directed every 30 (thirty) days.    [provider]  hydrochlorothiazide (HYDRODIURIL) 25 MG tablet TAKE (1) TABLET BY MOUTH ONCE DAILY. 10/02/20   Isaiah Serge, NP  hydrocortisone cream 1 % Apply 1 application topically daily as needed for itching.    [provider]  levothyroxine (SYNTHROID, LEVOTHROID) 88 MCG tablet Take 88 mcg by mouth daily before breakfast.    [provider]  losartan (COZAAR) 100 MG tablet Take 1 tablet (100 mg total) by mouth daily. 08/09/20   Isaiah Serge, NP  methotrexate 2.5 MG tablet Take 2.5 mg by mouth once a week. Take 7 tabs once per week 06/09/18   [provider]  metoprolol succinate (TOPROL-XL) 25 MG 24 hr tablet TAKE (1) TABLET BY MOUTH DAILY. 08/09/20   Isaiah Serge, NP  Multiple Vitamins-Minerals (MULTIVITAMIN WITH MINERALS) tablet Take 1 tablet by mouth daily at 12 noon.     [provider]  nitroGLYCERIN (NITROSTAT) 0.4 MG SL tablet Place 1 tablet (0.4 mg total) under the tongue every 5 (five) minutes as needed for chest pain. 08/09/20   Isaiah Serge, NP  Plecanatide (TRULANCE) 3 MG TABS Take 3 mg by mouth  daily. 11/06/20   Erenest Rasher, PA-C  polyethylene glycol (MIRALAX / GLYCOLAX) packet Take 17 g by mouth daily as needed for mild constipation.    [provider]  PROAIR HFA 108 [phone] Base) MCG/ACT inhaler Inhale 108 mcg into the lungs as needed. 03/26/18   [provider]  Probiotic Product (PROBIOTIC ADVANCED) CAPS Take 1 capsule  by mouth daily.    [provider]  traMADol (ULTRAM) 50 MG tablet Take 50 mg by mouth as needed. 04/11/18   [provider]  umeclidinium-vilanterol (ANORO ELLIPTA) 62.5-25 MCG/INH AEPB Inhale 1 puff into the lungs daily. 07/30/20   Tanda Rockers, MD  Wheat Dextrin (BENEFIBER) POWD Take 1 Dose by mouth as needed.     [provider]    Allergies    Codeine and Oxycontin [oxycodone]  Review of Systems   Review of Systems  Constitutional:  Positive for fatigue.  HENT:  Negative for congestion and sore throat.   Gastrointestinal:  Negative for abdominal pain, blood in stool, constipation, diarrhea, nausea and vomiting.  Genitourinary:  Negative for decreased urine volume, dysuria and hematuria.  Musculoskeletal:  Negative for back pain and joint swelling.  Skin:  Negative for color change and rash.  Neurological:  Positive for weakness and headaches.  All other systems reviewed and are negative.  Physical Exam Updated Vital Signs BP 98/73   Pulse 70   Temp 98.1 F (36.7 C) (Oral)   Resp 20   Ht '5\' 2"'$  (1.575 m)   Wt 56.4 kg   SpO2 97%   BMI 22.74 kg/m   Physical Exam Vitals and nursing note reviewed.  Constitutional:      General: She is not in acute distress.    Appearance: Normal appearance. She is not toxic-appearing.  HENT:     Head: Normocephalic and atraumatic.     Mouth/Throat:     Mouth: Mucous membranes are moist.  Eyes:     General: No scleral icterus. Cardiovascular:     Rate and Rhythm: Normal rate and regular rhythm.  Pulmonary:     Effort: Pulmonary effort is normal. No respiratory  distress.     Breath sounds: No wheezing.     Comments: Decreased breath sounds bilaterally  Abdominal:     General: Abdomen is flat. Bowel sounds are normal.     Palpations: Abdomen is soft.  Musculoskeletal:        General: No swelling or deformity.     Cervical back: Normal range of motion.  Skin:    General: Skin is warm and dry.  Neurological:     General: No focal deficit present.     Mental Status: She is alert and oriented to person, place, and time. Mental status is at baseline.    ED Results / Procedures / Treatments   Labs (all labs ordered are listed, but only abnormal results are displayed) Labs Reviewed  COMPREHENSIVE METABOLIC PANEL - Abnormal; Notable for the following components:      Result Value   Potassium 3.2 (*)    Glucose, Bld 102 (*)    BUN 25 (*)    Creatinine, Ser 1.56 (*)    Total Protein 8.4 (*)    AST 51 (*)    GFR, Estimated 36 (*)    All other components within normal limits  CBC WITH DIFFERENTIAL/PLATELET - Abnormal; Notable for the following components:   Monocytes Absolute 1.1 (*)    All other components within normal limits  URINALYSIS, ROUTINE W REFLEX MICROSCOPIC - Abnormal; Notable for the following components:   Hgb urine dipstick MODERATE (*)    Protein, ur TRACE (*)    Leukocytes,Ua SMALL (*)    All other components within normal limits  TSH - Abnormal; Notable for the following components:   TSH 0.344 (*)    All other components within normal limits  URINALYSIS, MICROSCOPIC (REFLEX) - Abnormal; Notable for the following components:   Bacteria, UA MANY (*)    All other components within normal limits  POC SARS CORONAVIRUS 2 AG -  ED - Abnormal; Notable for the following components:   SARS Coronavirus 2 Ag Positive (*)    All other components within normal limits  URINE CULTURE    EKG None  Radiology DG Chest 2 View  Result Date: 11/18/2020 CLINICAL DATA:  Diminished breath sounds. Generalized weakness. Headache. EXAM:  CHEST - 2 VIEW COMPARISON:  CT chest 10/04/2020. FINDINGS: Heart size normal. Atherosclerotic changes are noted at the aortic arch. Changes of COPD stable. No edema or effusion. No focal airspace disease. IMPRESSION: 1. No acute cardiopulmonary disease. 2. Stable changes of COPD. Electronically Signed   By: San Morelle M.D.   On: 11/18/2020 11:25   CT Renal Stone Study  Result Date: 11/18/2020 CLINICAL DATA:  Pt c/o increased weakness x 1 week. EXAM: CT ABDOMEN AND PELVIS WITHOUT CONTRAST TECHNIQUE: Multidetector CT imaging of the abdomen and pelvis was performed following the standard protocol without IV contrast. COMPARISON:  CT abdomen pelvis 08/24/2016 FINDINGS: Lower chest: No acute abnormality. Evaluation of the abdominal viscera is limited by the lack of IV contrast. Hepatobiliary: No focal liver abnormality is seen. Multiple calcified gallstones. No gallbladder wall thickening or pericholecystic fluid. Pancreas: Unremarkable. No surrounding inflammatory changes. Spleen: Normal in size without focal abnormality. Adrenals/Urinary Tract: Adrenal glands are unremarkable. No renal calculi or hydronephrosis identified. No large mass lesion. Urinary bladder is unremarkable. Stomach/Bowel: Stomach is within normal limits. Appendix appears normal. There is probable persistent bowel wall thickening in the sigmoid colon. No evidence of obstruction. Multiple colonic diverticula. Vascular/Lymphatic: Aortic atherosclerosis. Vascular patency can not be assessed in the absence of IV contrast no enlarged abdominal or pelvic lymph nodes. Reproductive: Status post hysterectomy. No adnexal masses. Other: No abdominal wall hernia or abnormality. No abdominopelvic ascites. Musculoskeletal: Stable grade 1 anterolisthesis of L4 on L5. IMPRESSION: 1. No renal calculi or hydronephrosis. 2. There is probable persistent bowel wall thickening in the sigmoid colon, which may represent sequela of chronic diverticular disease.  3. Cholelithiasis without evidence of acute cholecystitis. 4. Aortic atherosclerosis. Aortic Atherosclerosis (ICD10-I70.0). Electronically Signed   By: Audie Pinto M.D.   On: 11/18/2020 15:49    Procedures Procedures   Medications Ordered in ED Medications  ondansetron (ZOFRAN) tablet 4 mg (4 mg Oral Given 11/18/20 1343)    ED Course  I have reviewed the triage vital signs and the nursing notes.  Pertinent labs & imaging results that were available during my care of the patient were reviewed by me and considered in my medical decision making (see chart for details).  Emily Valentine is a 70 year old female past medical history of hypertension, hypothyroidism, COPD, and arthritis presents to the ED for evaluation of weakness and fatigue for 5 days.  Differential includes hypothyroidism, anemia, UTI, dehydration, hypoglycemia.   I personally reviewed all labs and imaging for this patient.  COVID positive. CBC shows no signs of anemia.  The patient's creatinine has increased since last labs 2 years ago from 1.13 to 1.56.  BUN is also elevated.  Urinalysis shows hematuria and bacteria with leukocytes. Chest x-ray shows stable signs of COPD and no other acute cardiopulmonary process. CT abdomen/pelvis shows no renal calculi or hydronephrosis, probable persistent bowel wall thickening in the sigmoid colon, which may represent sequela of chronic diverticular disease, Cholelithiasis without evidence of acute cholecystitis, and aortic  atherosclerosis.  Given the labs and imaging, along with physical exam findings, it is less likely this is dehydration, hypoglycemia, or anemia. Patient's TSH slightly low. Recommended follow up with PCP for possible adjustment of levothyroxine.   Re-evaluated patient who is sitting comfortably in bed and is now joined with her husband. Denies any medication for pain.   With patient's COVID positive status, Paxlovid with renal dosing, GFR 36, was ordered. Will be  treating the patient's UTI with Duricef bid for the next 7 days. Urine culture ordered.   Discussed lab and imaging findings with the patient and husband. Recommended follow-up with PCP and urology. Reviewed prescriptions. Return precautions given. Patient and husband are agreeable to plan. The patient is stable and is being discharged in good condition.      MDM Rules/Calculators/A&P                          Final Clinical Impression(s) / ED Diagnoses Final diagnoses:  COVID  Urinary tract infection with hematuria, site unspecified    Rx / DC Orders ED Discharge Orders          Ordered    cefadroxil (DURICEF) 500 MG capsule  2 times daily        11/18/20 1617    nirmatrelvir/ritonavir EUA, renal dosing, (PAXLOVID) 10 x 150 MG & 10 x '100MG'$  TABS  2 times daily        11/18/20 1620             Sherrell Puller, PA-C 11/18/20 1844    Teressa Lower, MD 11/19/20 [phone]

## 2020-11-18 NOTE — ED Notes (Signed)
Patient transported to CT 

## 2020-11-18 NOTE — ED Triage Notes (Signed)
Generalized weakness since Tuesday, pt alert and oriented, complaining of a headache, skin warm and dry.  Pt denies pain at this time.  Pt states she feels like she just needs to sleep.

## 2020-11-20 LAB — URINE CULTURE

## 2020-11-26 ENCOUNTER — Telehealth: Payer: Self-pay | Admitting: Internal Medicine

## 2020-11-26 NOTE — Telephone Encounter (Signed)
Pt was made aware and verbalized understanding.  

## 2020-11-26 NOTE — Telephone Encounter (Signed)
Pt called this morning saying she is getting over covid and wanted to know if we could recommend something to increase her appetite. She uses [employer]. [phone]

## 2020-11-26 NOTE — Telephone Encounter (Signed)
Is there something that you can recommend or does she need to speak with her PCP regarding this?

## 2020-11-26 NOTE — Telephone Encounter (Signed)
Recommend discussing with PCP.

## 2020-11-28 DIAGNOSIS — U099 Post covid-19 condition, unspecified: Secondary | ICD-10-CM | POA: Diagnosis not present

## 2020-12-17 ENCOUNTER — Observation Stay (HOSPITAL_COMMUNITY)
Admission: EM | Admit: 2020-12-17 | Discharge: 2020-12-18 | Disposition: A | Discharge status: Home / Self Care | Payer: Medicare PPO | Attending: Internal Medicine | Admitting: Internal Medicine

## 2020-12-17 ENCOUNTER — Encounter (HOSPITAL_COMMUNITY): Payer: Self-pay | Admitting: *Deleted

## 2020-12-17 ENCOUNTER — Emergency Department (HOSPITAL_COMMUNITY): Payer: Medicare PPO

## 2020-12-17 ENCOUNTER — Other Ambulatory Visit: Payer: Self-pay

## 2020-12-17 ENCOUNTER — Observation Stay (HOSPITAL_BASED_OUTPATIENT_CLINIC_OR_DEPARTMENT_OTHER): Payer: Medicare PPO

## 2020-12-17 DIAGNOSIS — E559 Vitamin D deficiency, unspecified: Secondary | ICD-10-CM | POA: Diagnosis not present

## 2020-12-17 DIAGNOSIS — E039 Hypothyroidism, unspecified: Secondary | ICD-10-CM | POA: Diagnosis not present

## 2020-12-17 DIAGNOSIS — Z79899 Other long term (current) drug therapy: Secondary | ICD-10-CM | POA: Insufficient documentation

## 2020-12-17 DIAGNOSIS — U071 COVID-19: Secondary | ICD-10-CM | POA: Diagnosis not present

## 2020-12-17 DIAGNOSIS — E872 Acidosis, unspecified: Principal | ICD-10-CM | POA: Insufficient documentation

## 2020-12-17 DIAGNOSIS — I950 Idiopathic hypotension: Secondary | ICD-10-CM | POA: Diagnosis not present

## 2020-12-17 DIAGNOSIS — R531 Weakness: Secondary | ICD-10-CM | POA: Diagnosis not present

## 2020-12-17 DIAGNOSIS — Z720 Tobacco use: Secondary | ICD-10-CM | POA: Diagnosis not present

## 2020-12-17 DIAGNOSIS — I129 Hypertensive chronic kidney disease with stage 1 through stage 4 chronic kidney disease, or unspecified chronic kidney disease: Secondary | ICD-10-CM | POA: Insufficient documentation

## 2020-12-17 DIAGNOSIS — N1832 Chronic kidney disease, stage 3b: Secondary | ICD-10-CM | POA: Diagnosis not present

## 2020-12-17 DIAGNOSIS — I428 Other cardiomyopathies: Secondary | ICD-10-CM | POA: Diagnosis not present

## 2020-12-17 DIAGNOSIS — Z8616 Personal history of COVID-19: Secondary | ICD-10-CM | POA: Insufficient documentation

## 2020-12-17 DIAGNOSIS — I9589 Other hypotension: Secondary | ICD-10-CM

## 2020-12-17 DIAGNOSIS — J449 Chronic obstructive pulmonary disease, unspecified: Secondary | ICD-10-CM | POA: Diagnosis not present

## 2020-12-17 DIAGNOSIS — E861 Hypovolemia: Secondary | ICD-10-CM

## 2020-12-17 DIAGNOSIS — Z87891 Personal history of nicotine dependence: Secondary | ICD-10-CM | POA: Diagnosis not present

## 2020-12-17 DIAGNOSIS — Z7982 Long term (current) use of aspirin: Secondary | ICD-10-CM | POA: Insufficient documentation

## 2020-12-17 DIAGNOSIS — E876 Hypokalemia: Secondary | ICD-10-CM | POA: Diagnosis not present

## 2020-12-17 DIAGNOSIS — I10 Essential (primary) hypertension: Secondary | ICD-10-CM | POA: Diagnosis not present

## 2020-12-17 DIAGNOSIS — D510 Vitamin B12 deficiency anemia due to intrinsic factor deficiency: Secondary | ICD-10-CM | POA: Diagnosis not present

## 2020-12-17 DIAGNOSIS — M069 Rheumatoid arthritis, unspecified: Secondary | ICD-10-CM | POA: Diagnosis not present

## 2020-12-17 DIAGNOSIS — J439 Emphysema, unspecified: Secondary | ICD-10-CM | POA: Diagnosis not present

## 2020-12-17 HISTORY — DX: Rheumatoid arthritis, unspecified: M06.9

## 2020-12-17 LAB — LACTIC ACID, PLASMA
Lactic Acid, Venous: 2.7 mmol/L (ref 0.5–1.9)
Lactic Acid, Venous: 3.3 mmol/L (ref 0.5–1.9)
Lactic Acid, Venous: 1.3 mmol/L (ref 0.5–1.9)

## 2020-12-17 LAB — CBC WITH DIFFERENTIAL/PLATELET
Abs Immature Granulocytes: 0.05 10*3/uL (ref 0.00–0.07)
Basophils Absolute: 0 10*3/uL (ref 0.0–0.1)
Basophils Relative: 0 %
Eosinophils Absolute: 0 10*3/uL (ref 0.0–0.5)
Eosinophils Relative: 0 %
HCT: 39.1 % (ref 36.0–46.0)
Hemoglobin: 13.6 g/dL (ref 12.0–15.0)
Immature Granulocytes: 1 %
Lymphocytes Relative: 18 %
Lymphs Abs: 1.6 10*3/uL (ref 0.7–4.0)
MCH: 33.6 pg (ref 26.0–34.0)
MCHC: 34.8 g/dL (ref 30.0–36.0)
MCV: 96.5 fL (ref 80.0–100.0)
Monocytes Absolute: 1.2 10*3/uL — ABNORMAL HIGH (ref 0.1–1.0)
Monocytes Relative: 13 %
Neutro Abs: 6.1 10*3/uL (ref 1.7–7.7)
Neutrophils Relative %: 68 %
Platelets: 238 10*3/uL (ref 150–400)
RBC: 4.05 MIL/uL (ref 3.87–5.11)
RDW: 16.3 % — ABNORMAL HIGH (ref 11.5–15.5)
WBC: 8.9 10*3/uL (ref 4.0–10.5)
nRBC: 0 % (ref 0.0–0.2)

## 2020-12-17 LAB — ECHOCARDIOGRAM COMPLETE
AR max vel: 1.84 cm2
AV Area VTI: 2.32 cm2
AV Area mean vel: 1.93 cm2
AV Mean grad: 4 mmHg
AV Peak grad: 8.6 mmHg
Ao pk vel: 1.47 m/s
Area-P 1/2: 3.3 cm2
Height: 63 in
MV VTI: 2.69 cm2
S' Lateral: 1.9 cm
Weight: 1904 oz

## 2020-12-17 LAB — URINALYSIS, ROUTINE W REFLEX MICROSCOPIC
Bilirubin Urine: NEGATIVE
Glucose, UA: NEGATIVE mg/dL
Ketones, ur: NEGATIVE mg/dL
Nitrite: NEGATIVE
Protein, ur: NEGATIVE mg/dL
Specific Gravity, Urine: 1.005 (ref 1.005–1.030)
pH: 6 (ref 5.0–8.0)

## 2020-12-17 LAB — COMPREHENSIVE METABOLIC PANEL
ALT: 26 U/L (ref 0–44)
AST: 27 U/L (ref 15–41)
Albumin: 3.4 g/dL — ABNORMAL LOW (ref 3.5–5.0)
Alkaline Phosphatase: 79 U/L (ref 38–126)
Anion gap: 11 (ref 5–15)
BUN: 27 mg/dL — ABNORMAL HIGH (ref 8–23)
CO2: 26 mmol/L (ref 22–32)
Calcium: 9.1 mg/dL (ref 8.9–10.3)
Chloride: 97 mmol/L — ABNORMAL LOW (ref 98–111)
Creatinine, Ser: 1.42 mg/dL — ABNORMAL HIGH (ref 0.44–1.00)
GFR, Estimated: 40 mL/min — ABNORMAL LOW (ref >60)
Glucose, Bld: 119 mg/dL — ABNORMAL HIGH (ref 70–99)
Potassium: 3 mmol/L — ABNORMAL LOW (ref 3.5–5.1)
Sodium: 134 mmol/L — ABNORMAL LOW (ref 135–145)
Total Bilirubin: 2 mg/dL — ABNORMAL HIGH (ref 0.3–1.2)
Total Protein: 7.9 g/dL (ref 6.5–8.1)

## 2020-12-17 LAB — VITAMIN D 25 HYDROXY (VIT D DEFICIENCY, FRACTURES): Vit D, 25-Hydroxy: 28.8 ng/mL — ABNORMAL LOW (ref 30–100)

## 2020-12-17 LAB — PROTIME-INR
INR: 1 (ref 0.8–1.2)
Prothrombin Time: 13.4 seconds (ref 11.4–15.2)

## 2020-12-17 LAB — PHOSPHORUS: Phosphorus: 3.6 mg/dL (ref 2.5–4.6)

## 2020-12-17 LAB — VITAMIN B12: Vitamin B-12: 946 pg/mL — ABNORMAL HIGH (ref 180–914)

## 2020-12-17 LAB — TROPONIN I (HIGH SENSITIVITY)
Troponin I (High Sensitivity): 58 ng/L — ABNORMAL HIGH (ref <18)
Troponin I (High Sensitivity): 54 ng/L — ABNORMAL HIGH (ref <18)

## 2020-12-17 LAB — MAGNESIUM: Magnesium: 1.9 mg/dL (ref 1.7–2.4)

## 2020-12-17 LAB — TSH: TSH: 1.368 u[IU]/mL (ref 0.350–4.500)

## 2020-12-17 LAB — APTT: aPTT: 27 seconds (ref 24–36)

## 2020-12-17 LAB — CORTISOL: Cortisol, Plasma: 16.6 ug/dL

## 2020-12-17 LAB — PROCALCITONIN: Procalcitonin: <0.1 ng/mL

## 2020-12-17 MED ORDER — HEPARIN SODIUM (PORCINE) 5000 UNIT/ML IJ SOLN
5000.0000 [IU] | Freq: Three times a day (TID) | INTRAMUSCULAR | Status: DC
  Administered 2020-12-17 – 2020-12-18 (×2): 5000 [IU] via SUBCUTANEOUS
  Filled 2020-12-17 (×2): qty 1

## 2020-12-17 MED ORDER — ACETAMINOPHEN 650 MG RE SUPP: 650.0000 mg | Freq: Four times a day (QID) | RECTAL | Status: DC | PRN

## 2020-12-17 MED ORDER — POLYETHYLENE GLYCOL 3350 17 G PO PACK: 17.0000 g | PACK | Freq: Every day | ORAL | Status: DC | PRN

## 2020-12-17 MED ORDER — POTASSIUM CHLORIDE 10 MEQ/100ML IV SOLN
10.0000 meq | Freq: Once | INTRAVENOUS | Status: AC
  Administered 2020-12-17: 10 meq via INTRAVENOUS
  Filled 2020-12-17: qty 100

## 2020-12-17 MED ORDER — UMECLIDINIUM-VILANTEROL 62.5-25 MCG/INH IN AEPB
1.0000 | INHALATION_SPRAY | Freq: Every day | RESPIRATORY_TRACT | Status: DC
  Administered 2020-12-18: 1 via RESPIRATORY_TRACT
  Filled 2020-12-17: qty 14

## 2020-12-17 MED ORDER — LEVOFLOXACIN IN D5W 750 MG/150ML IV SOLN
750.0000 mg | Freq: Once | INTRAVENOUS | Status: AC
  Administered 2020-12-17: 750 mg via INTRAVENOUS

## 2020-12-17 MED ORDER — SODIUM CHLORIDE 0.9 % IV SOLN
1.0000 g | INTRAVENOUS | Status: DC
  Filled 2020-12-17: qty 10

## 2020-12-17 MED ORDER — SODIUM CHLORIDE 0.9 % IV SOLN
500.0000 mg | INTRAVENOUS | Status: DC
  Filled 2020-12-17: qty 500

## 2020-12-17 MED ORDER — ALBUTEROL SULFATE (2.5 MG/3ML) 0.083% IN NEBU: 2.5000 mg | INHALATION_SOLUTION | Freq: Four times a day (QID) | RESPIRATORY_TRACT | Status: DC | PRN

## 2020-12-17 MED ORDER — ONDANSETRON HCL 4 MG PO TABS: 4.0000 mg | ORAL_TABLET | Freq: Four times a day (QID) | ORAL | Status: DC | PRN

## 2020-12-17 MED ORDER — SODIUM CHLORIDE 0.9 % IV BOLUS (SEPSIS)
250.0000 mL | Freq: Once | INTRAVENOUS | Status: AC
  Administered 2020-12-17: 250 mL via INTRAVENOUS

## 2020-12-17 MED ORDER — LEVOTHYROXINE SODIUM 88 MCG PO TABS
88.0000 ug | ORAL_TABLET | Freq: Every day | ORAL | Status: DC
  Administered 2020-12-18: 88 ug via ORAL
  Filled 2020-12-17: qty 1

## 2020-12-17 MED ORDER — SODIUM CHLORIDE 0.9 % IV BOLUS (SEPSIS)
1000.0000 mL | Freq: Once | INTRAVENOUS | Status: AC
  Administered 2020-12-17: 1000 mL via INTRAVENOUS

## 2020-12-17 MED ORDER — LACTATED RINGERS IV SOLN: INTRAVENOUS | Status: AC

## 2020-12-17 MED ORDER — SODIUM CHLORIDE 0.9 % IV BOLUS (SEPSIS)
500.0000 mL | Freq: Once | INTRAVENOUS | Status: AC
  Administered 2020-12-17: 500 mL via INTRAVENOUS

## 2020-12-17 MED ORDER — LEVOFLOXACIN IN D5W 750 MG/150ML IV SOLN
750.0000 mg | Freq: Once | INTRAVENOUS | Status: DC
  Filled 2020-12-17: qty 150

## 2020-12-17 MED ORDER — UMECLIDINIUM-VILANTEROL 62.5-25 MCG/INH IN AEPB
1.0000 | INHALATION_SPRAY | Freq: Every day | RESPIRATORY_TRACT | Status: DC
Start: 2020-12-17 — End: 2020-12-17

## 2020-12-17 MED ORDER — POTASSIUM CHLORIDE CRYS ER 20 MEQ PO TBCR
40.0000 meq | EXTENDED_RELEASE_TABLET | Freq: Once | ORAL | Status: AC
  Administered 2020-12-17: 40 meq via ORAL
  Filled 2020-12-17: qty 2

## 2020-12-17 MED ORDER — ACETAMINOPHEN 325 MG PO TABS
650.0000 mg | ORAL_TABLET | Freq: Four times a day (QID) | ORAL | Status: DC | PRN
  Administered 2020-12-17: 650 mg via ORAL
  Filled 2020-12-17: qty 2

## 2020-12-17 MED ORDER — FLUTICASONE PROPIONATE 50 MCG/ACT NA SUSP
1.0000 | Freq: Every day | NASAL | Status: DC
  Filled 2020-12-17: qty 16

## 2020-12-17 MED ORDER — ONDANSETRON HCL 4 MG/2ML IJ SOLN: 4.0000 mg | Freq: Four times a day (QID) | INTRAMUSCULAR | Status: DC | PRN

## 2020-12-17 NOTE — Consult Note (Signed)
[facility]   Patient name: Emily Valentine Admit date: 12/17/2020  DOB: 1950/05/20 LOS: 0  MRN: [id number] Consult date: 12/17/2020  Referring provider: Dr. Dewayne Hatch, ER CC: Low BP    History:  70 yo female with hx of COPD, RA on MTX had COVID in September 2022.  Treated with paxlovid and steroids.  She has persisted cough, chest congestion, chills, and weakness.  Went to see her PCP and was advised to come to ER.  She was found to have low BP, and improved with IV fluids.  Had elevated lactic acid and creatinine.  PCCM asked to assist with management.  Past medical history:  Diverticulitis, HTN, Thyroidectomy, Hypothyroidism, Pernicious anemia, Rheumatoid arthritis, RBBB, COVID 19 infection September 2022, COPD  Significant events:    Studies:  PFT 08/27/20 >> FEV1 0.97 (61%), FEV1% 50, TLC 7.32 (158%), DLCO 30% LDCT chest 10/06/20 >> coronary atherosclerosis, moderate centrilobular and paraseptal emphysema, bronchial thickening  Micro:  Blood 10/4 >>   Lines:     Antibiotics:    Consults:      Interim history:  She has sinus congestion.  Has cough and chest congestion, but having trouble bringing up sputum.  Denies abdominal pain, nausea, or diarrhea.  Has been feeling chilled.  Feels better after getting IV fluid.  Vital signs:  BP 121/70   Pulse 95   Temp 97.7 F (36.5 C) (Oral)   Resp (!) 25   Ht 5\' 3"  (1.6 m)   Wt 54 kg   SpO2 99%   BMI 21.08 kg/m   Intake/output:  No intake/output data recorded.   Physical exam:   General - alert Eyes - pupils reactive ENT - no sinus tenderness, no stridor Cardiac - regular rate/rhythm, no murmur Chest - equal breath sounds b/l, no wheezing or rales Abdomen - soft, non tender, + bowel sounds Extremities - no cyanosis, clubbing, or edema Skin - no rashes Neuro - normal strength, moves extremities, follows commands Psych - normal mood and behavior  Best  practice:   DVT -   SUP -   Nutrition -   Mobility -     Assessment/plan:   Hypotension. - could be related to sepsis and/or relative adrenal insufficiency after recent prednisone therapy - would continue IV fluids - goal MAP > 65, SBP > 90 - would check cortisol - Abx per primary team - f/u blood culture results from 10/4  Hx of COPD with emphysema. - continue inhaler regimen >> uses anoro and prn albuterol as outpt)  Hx of rheumatoid arthritis. - might need to hold outpt methotrexate until certain she doesn't have recurrent infection  Elevated troponin. Hx of CAD, HTN. - f/u Echo - hold outpt cozaar, HCTZ, toprol  AKI prerenal from hypovolemia. Hypokalemia. - f/u BMET  Hypothyroidism. - continue synthroid   Resolved hospital problems:    Goals of care/Family discussions:  Code status: Full code  Updated pt's husband at bedside  Labs:   CMP Latest Ref Rng & Units 12/17/2020 11/18/2020 08/01/2018  Glucose 70 - 99 mg/dL 119(H) 102(H) 69  BUN 8 - 23 mg/dL 27(H) 25(H) 23  Creatinine 0.44 - 1.00 mg/dL 1.42(H) 1.56(H) 1.13(H)  Sodium 135 - 145 mmol/L 134(L) 136 141  Potassium 3.5 - 5.1 mmol/L 3.0(L) 3.2(L) 3.7  Chloride 98 - 111 mmol/L 97(L) 99 102  CO2 22 - 32 mmol/L 26 29 26   Calcium 8.9 - 10.3 mg/dL 9.1 9.1 10.0  Total Protein 6.5 - 8.1  g/dL 7.9 8.4(H) -  Total Bilirubin 0.3 - 1.2 mg/dL 2.0(H) 0.8 -  Alkaline Phos 38 - 126 U/L 79 75 -  AST 15 - 41 U/L 27 51(H) -  ALT 0 - 44 U/L 26 31 -    CBC Latest Ref Rng & Units 12/17/2020 11/18/2020 08/24/2016  WBC 4.0 - 10.5 K/uL 8.9 5.2 14.3(H)  Hemoglobin 12.0 - 15.0 g/dL 13.6 15.0 13.7  Hematocrit 36.0 - 46.0 % 39.1 43.9 39.8  Platelets 150 - 400 K/uL 238 185 274    ABG No results found for: PHART, PCO2ART, PO2ART, HCO3, TCO2, ACIDBASEDEF, O2SAT  CBG (last 3)  No results for input(s): GLUCAP in the last 72 hours.   Past surgical history:  She  has a past surgical history that includes Thyroidectomy; Abdominal  hysterectomy; Back surgery; Colonoscopy (11/10/2010); Breast lumpectomy with radioactive seed localization (Right, 02/21/2015); Colonoscopy (N/A, 09/28/2016); Breast biopsy (Right, 10/29/2010); Breast biopsy (Right, 11/27/2014); and Breast excisional biopsy (Right, 02/21/2015).  Social history:  She  reports that she has quit smoking. Her smoking use included cigarettes. She has quit using smokeless tobacco. She reports that she does not drink alcohol and does not use drugs.   Review of systems:  Reviewed and negative  Family history:  Her family history includes Anemia in her father, mother, and sister; Cancer in her brother, father, and mother; Colon cancer in her father; Diabetes in her sister; Hypertension in her brother, father, mother, and sister; Stomach cancer in her mother; Thyroid disease in her brother, mother, and sister.    Medications:   No current facility-administered medications on file prior to encounter.   Current Outpatient Medications on File Prior to Encounter  Medication Sig   acetaminophen (TYLENOL) 650 MG CR tablet Take 650 mg by mouth 2 (two) times daily as needed for pain.   albuterol (VENTOLIN HFA) 108 (90 Base) MCG/ACT inhaler Inhale 1-2 puffs into the lungs every 6 (six) hours as needed for wheezing or shortness of breath.   Cyanocobalamin (VITAMIN B-12 IJ) Inject 1,000 mcg as directed every 30 (thirty) days.   hydrochlorothiazide (HYDRODIURIL) 25 MG tablet TAKE (1) TABLET BY MOUTH ONCE DAILY. (Patient taking differently: Take 25 mg by mouth daily.)   hydrocortisone cream 1 % Apply 1 application topically daily as needed for itching.   levothyroxine (SYNTHROID, LEVOTHROID) 88 MCG tablet Take 88 mcg by mouth daily before breakfast.   losartan (COZAAR) 100 MG tablet Take 1 tablet (100 mg total) by mouth daily.   methotrexate 2.5 MG tablet Take 2.5 mg by mouth once a week. Take 7 tabs once per week   metoprolol succinate (TOPROL-XL) 25 MG 24 hr tablet TAKE (1)  TABLET BY MOUTH DAILY. (Patient taking differently: Take 25 mg by mouth daily.)   Multiple Vitamins-Minerals (MULTIVITAMIN WITH MINERALS) tablet Take 1 tablet by mouth daily at 12 noon.    Plecanatide (TRULANCE) 3 MG TABS Take 3 mg by mouth daily.   polyethylene glycol (MIRALAX / GLYCOLAX) packet Take 17 g by mouth daily as needed for mild constipation.   traMADol (ULTRAM) 50 MG tablet Take 50 mg by mouth as needed for moderate pain.   Wheat Dextrin (BENEFIBER) POWD Take 1 Dose by mouth as needed (stool).   aspirin EC 81 MG tablet Take 1 tablet (81 mg total) by mouth daily. (Patient not taking: Reported on 12/17/2020)   cefadroxil (DURICEF) 500 MG capsule Take 1 capsule (500 mg total) by mouth 2 (two) times daily. (Patient not taking: No sig  reported)   nitroGLYCERIN (NITROSTAT) 0.4 MG SL tablet Place 1 tablet (0.4 mg total) under the tongue every 5 (five) minutes as needed for chest pain.   umeclidinium-vilanterol (ANORO ELLIPTA) 62.5-25 MCG/INH AEPB Inhale 1 puff into the lungs daily. (Patient not taking: No sig reported)     Signature:  Chesley Mires, MD [place] Pager - [phone] 12/17/2020, 2:13 PM

## 2020-12-17 NOTE — ED Provider Notes (Signed)
[facility] EMERGENCY DEPARTMENT Provider Note   CSN: [id number] Arrival date & time: 12/17/20  1037     History Chief Complaint  Patient presents with   Shortness of Breath    Emily Valentine is a 70 y.o. female.  Patient presents with cough and weakness.  She was diagnosed with COVID on September 5 and was treated initially with Macrobid and also was given [facility] for UTI.  She was not improving so she was put on prednisone and Zithromax  The history is provided by the patient and medical records. No language interpreter was used.  Shortness of Breath Severity:  Moderate Onset quality:  Sudden Timing:  Constant Progression:  Worsening Chronicity:  Recurrent Context: activity   Relieved by:  Nothing Worsened by:  Nothing Ineffective treatments:  None tried Associated symptoms: no abdominal pain, no chest pain, no cough, no headaches and no rash       Past Medical History:  Diagnosis Date   Diverticulitis 08/2016   History of echocardiogram    Echo (8/15): Vigorous LV function, EF 65-70%, normal wall motion, grade 1 diastolic dysfunction, mild to moderate TR, PASP 31 mm Hg   Hx of cardiovascular stress test    ETT-Myoview (8/15): no ischemia, EF 56%, normal study   Hypertension    Hypothyroid    Pernicious anemia    PVC's (premature ventricular contractions)    Rheumatoid arthritis ([place])    Right bundle branch block    S/P colonoscopy 2001, 2004   2001: hyperplastic polyp, 2004: normal, inflammatory polyp    Patient Active Problem List   Diagnosis Date Noted   COPD  GOLD 2  / still smoking 07/30/2020   Constipation 04/19/2018   Memory changes 06/26/2017   Mass of right breast 01/17/2015   Perimenopausal vasomotor symptoms 09/05/2014   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   Palpitations 10/04/2013   Right bundle branch block 08/28/2011   Cigarette smoker 08/28/2011   Essential hypertension 08/28/2011   Encounter for screening colonoscopy 10/14/2010    Past Surgical  History:  Procedure Laterality Date   ABDOMINAL HYSTERECTOMY     BACK SURGERY     BREAST BIOPSY Right 10/29/2010   BREAST BIOPSY Right 11/27/2014   BREAST EXCISIONAL BIOPSY Right 02/21/2015   BREAST LUMPECTOMY WITH RADIOACTIVE SEED LOCALIZATION Right 02/21/2015   Procedure: RIGHT BREAST LUMPECTOMY WITH RADIOACTIVE SEED LOCALIZATION;  Surgeon: Rolm Bookbinder, MD;  Location: [facility];  Service: General;  Laterality: Right;   COLONOSCOPY  11/10/2010   Procedure: COLONOSCOPY;  Surgeon: Daneil Dolin, MD;  Location: [facility];  Service: Endoscopy;  Laterality: N/A;   COLONOSCOPY N/A 09/28/2016   Dr. Gala Romney; Diverticulosis with evidence of recent diverticulitis.  Next colonoscopy in 2023 given history of tubular adenomas in the past.   THYROIDECTOMY       OB History     Gravida  3   Para  3   Term      Preterm      AB      Living  3      SAB      IAB      Ectopic      Multiple      Live Births              Family History  Problem Relation Age of Onset   Stomach cancer Mother        deceased   Anemia Mother    Cancer Mother  Hypertension Mother    Thyroid disease Mother    Colon cancer Father        diagnosed age 35, died at age 46   Anemia Father    Cancer Father    Hypertension Father    Anemia Sister    Cancer Brother    Diabetes Sister    Hypertension Sister    Hypertension Brother    Thyroid disease Brother    Thyroid disease Sister     Social History   Tobacco Use   Smoking status: Former    Years: 35.00    Types: Cigarettes   Smokeless tobacco: Former   Tobacco comments:    3-4 cigs per day 07/30/20//lmr  Vaping Use   Vaping Use: Never used  Substance Use Topics   Alcohol use: No   Drug use: No    Home Medications Prior to Admission medications   Medication Sig Start Date End Date Taking? Authorizing Provider  acetaminophen (TYLENOL) 650 MG CR tablet Take 650 mg by mouth 2 (two) times daily as needed for  pain.   Yes [provider]  albuterol (VENTOLIN HFA) 108 (90 Base) MCG/ACT inhaler Inhale 1-2 puffs into the lungs every 6 (six) hours as needed for wheezing or shortness of breath.   Yes [provider]  Cyanocobalamin (VITAMIN B-12 IJ) Inject 1,000 mcg as directed every 30 (thirty) days.   Yes [provider]  hydrochlorothiazide (HYDRODIURIL) 25 MG tablet TAKE (1) TABLET BY MOUTH ONCE DAILY. Patient taking differently: Take 25 mg by mouth daily. 10/02/20  Yes Isaiah Serge, NP  hydrocortisone cream 1 % Apply 1 application topically daily as needed for itching.   Yes [provider]  levothyroxine (SYNTHROID, LEVOTHROID) 88 MCG tablet Take 88 mcg by mouth daily before breakfast.   Yes [provider]  losartan (COZAAR) 100 MG tablet Take 1 tablet (100 mg total) by mouth daily. 08/09/20  Yes Isaiah Serge, NP  methotrexate 2.5 MG tablet Take 2.5 mg by mouth once a week. Take 7 tabs once per week 06/09/18  Yes [provider]  metoprolol succinate (TOPROL-XL) 25 MG 24 hr tablet TAKE (1) TABLET BY MOUTH DAILY. Patient taking differently: Take 25 mg by mouth daily. 08/09/20  Yes Isaiah Serge, NP  Multiple Vitamins-Minerals (MULTIVITAMIN WITH MINERALS) tablet Take 1 tablet by mouth daily at 12 noon.    Yes [provider]  Plecanatide (TRULANCE) 3 MG TABS Take 3 mg by mouth daily. 11/06/20  Yes Aliene Altes S, PA-C  polyethylene glycol (MIRALAX / GLYCOLAX) packet Take 17 g by mouth daily as needed for mild constipation.   Yes [provider]  traMADol (ULTRAM) 50 MG tablet Take 50 mg by mouth as needed for moderate pain. 04/11/18  Yes [provider]  Wheat Dextrin (BENEFIBER) POWD Take 1 Dose by mouth as needed (stool).   Yes [provider]  aspirin EC 81 MG tablet Take 1 tablet (81 mg total) by mouth daily. Patient not taking: Reported on 12/17/2020 10/04/13   Richardson Dopp T, PA-C  cefadroxil (DURICEF) 500  MG capsule Take 1 capsule (500 mg total) by mouth 2 (two) times daily. Patient not taking: No sig reported 11/18/20   Sherrell Puller, PA-C  nitroGLYCERIN (NITROSTAT) 0.4 MG SL tablet Place 1 tablet (0.4 mg total) under the tongue every 5 (five) minutes as needed for chest pain. 08/09/20   Isaiah Serge, NP  umeclidinium-vilanterol (ANORO ELLIPTA) 62.5-25 MCG/INH AEPB Inhale 1 puff  into the lungs daily. Patient not taking: No sig reported 07/30/20   Tanda Rockers, MD    Allergies    Codeine and Oxycontin [oxycodone]  Review of Systems   Review of Systems  Constitutional:  Positive for fatigue. Negative for appetite change.  HENT:  Negative for congestion, ear discharge and sinus pressure.   Eyes:  Negative for discharge.  Respiratory:  Positive for shortness of breath. Negative for cough.   Cardiovascular:  Negative for chest pain.  Gastrointestinal:  Negative for abdominal pain and diarrhea.  Genitourinary:  Negative for frequency and hematuria.  Musculoskeletal:  Negative for back pain.  Skin:  Negative for rash.  Neurological:  Negative for seizures and headaches.  Psychiatric/Behavioral:  Negative for hallucinations.    Physical Exam Updated Vital Signs BP (!) 90/59   Pulse 86   Temp 97.7 F (36.5 C) (Oral)   Resp (!) 21   Ht 5\' 3"  (1.6 m)   Wt 54 kg   SpO2 100%   BMI 21.08 kg/m   Physical Exam Vitals and nursing note reviewed.  Constitutional:      Appearance: She is well-developed.  HENT:     Head: Normocephalic.     Mouth/Throat:     Mouth: Mucous membranes are moist.  Eyes:     General: No scleral icterus.    Conjunctiva/sclera: Conjunctivae normal.  Neck:     Thyroid: No thyromegaly.  Cardiovascular:     Rate and Rhythm: Normal rate and regular rhythm.     Heart sounds: No murmur heard.   No friction rub. No gallop.  Pulmonary:     Breath sounds: No stridor. No wheezing or rales.  Chest:     Chest wall: No tenderness.  Abdominal:     General: There is  no distension.     Tenderness: There is no abdominal tenderness. There is no rebound.  Musculoskeletal:        General: Normal range of motion.     Cervical back: Neck supple.  Lymphadenopathy:     Cervical: No cervical adenopathy.  Skin:    Findings: No erythema or rash.  Neurological:     Mental Status: She is alert and oriented to person, place, and time.     Motor: No abnormal muscle tone.     Coordination: Coordination normal.  Psychiatric:        Behavior: Behavior normal.    ED Results / Procedures / Treatments   Labs (all labs ordered are listed, but only abnormal results are displayed) Labs Reviewed  LACTIC ACID, PLASMA - Abnormal; Notable for the following components:      Result Value   Lactic Acid, Venous 2.7 (*)    All other components within normal limits  COMPREHENSIVE METABOLIC PANEL - Abnormal; Notable for the following components:   Sodium 134 (*)    Potassium 3.0 (*)    Chloride 97 (*)    Glucose, Bld 119 (*)    BUN 27 (*)    Creatinine, Ser 1.42 (*)    Albumin 3.4 (*)    Total Bilirubin 2.0 (*)    GFR, Estimated 40 (*)    All other components within normal limits  CBC WITH DIFFERENTIAL/PLATELET - Abnormal; Notable for the following components:   RDW 16.3 (*)    Monocytes Absolute 1.2 (*)    All other components within normal limits  TROPONIN I (HIGH SENSITIVITY) - Abnormal; Notable for the following components:   Troponin I (High Sensitivity) 58 (*)  All other components within normal limits  CULTURE, BLOOD (ROUTINE X 2)  CULTURE, BLOOD (ROUTINE X 2)  PROTIME-INR  APTT  LACTIC ACID, PLASMA  URINALYSIS, ROUTINE W REFLEX MICROSCOPIC  TROPONIN I (HIGH SENSITIVITY)    EKG None  Radiology DG Chest Port 1 View  Result Date: 12/17/2020 CLINICAL DATA:  Pt was diagnosed with Covid on 11/18/20. Pt has been seen by her PCP few weeks ago but has continued to stay weak. Pt called her doctor today and they told her to come to the ED for further  evaluation. EXAM: PORTABLE CHEST 1 VIEW COMPARISON:  11/18/2020. FINDINGS: Cardiac silhouette is normal in size. No mediastinal or hilar masses. Lungs are hyperexpanded. There are several linear opacities in the mid to lower lungs, most prominent in the left mid lung, consistent with scarring. No evidence of pneumonia or pulmonary edema. No pleural effusion or pneumothorax. Skeletal structures are grossly intact. IMPRESSION: 1. No acute cardiopulmonary disease. 2. Hyperexpanded lungs with areas of linear scarring, without change from the prior study. Electronically Signed   By: Lajean Manes M.D.   On: 12/17/2020 11:56    Procedures Procedures   Medications Ordered in ED Medications  lactated ringers infusion ( Intravenous Not Given 12/17/20 1214)  potassium chloride 10 mEq in 100 mL IVPB (10 mEq Intravenous New Bag/Given 12/17/20 1236)  sodium chloride 0.9 % bolus 1,000 mL (1,000 mLs Intravenous New Bag/Given 12/17/20 1137)    And  sodium chloride 0.9 % bolus 500 mL (0 mLs Intravenous Stopped 12/17/20 1234)    And  sodium chloride 0.9 % bolus 250 mL (250 mLs Intravenous New Bag/Given 12/17/20 1234)  levofloxacin (LEVAQUIN) IVPB 750 mg (750 mg Intravenous New Bag/Given 12/17/20 1149)  potassium chloride SA (KLOR-CON) CR tablet 40 mEq (40 mEq Oral Given 12/17/20 1231)    ED Course  I have reviewed the triage vital signs and the nursing notes.  Pertinent labs & imaging results that were available during my care of the patient were reviewed by me and considered in my medical decision making (see chart for details). CRITICAL CARE Performed by: Milton Ferguson Total critical care time: 40 minutes Critical care time was exclusive of separately billable procedures and treating other patients. Critical care was necessary to treat or prevent imminent or life-threatening deterioration. Critical care was time spent personally by me on the following activities: development of treatment plan with patient and/or  surrogate as well as nursing, discussions with consultants, evaluation of patient's response to treatment, examination of patient, obtaining history from patient or surrogate, ordering and performing treatments and interventions, ordering and review of laboratory studies, ordering and review of radiographic studies, pulse oximetry and re-evaluation of patient's condition. Patient had elevated troponin with ST depression that seems old.  I spoke with cardiology and they recommended an echo.  I also spoke with critical care who will see the patient but want medicine to admit.   MDM Rules/Calculators/A&P                           Patient with hypertension dehydration hypokalemia.  Patient will be admitted to medicine Final Clinical Impression(s) / ED Diagnoses Final diagnoses:  Idiopathic hypotension    Rx / DC Orders ED Discharge Orders     None        Milton Ferguson, MD 12/21/20 1124

## 2020-12-17 NOTE — Progress Notes (Signed)
Date and time results received: 12/17/20 1200  (use smartphrase ".now" to insert current time)  Test: Lactic Acid Critical Value: 2.7  Name of Provider Notified: MD Zammit  Orders Received? Or Actions Taken?:  No new orders obtained at this time

## 2020-12-17 NOTE — H&P (Signed)
History and Physical    Emily Valentine PPI:[id number] DOB: May 22, 1950 DOA: 12/17/2020  PCP: Sharilyn Sites, MD   Patient coming from: home  I have personally briefly reviewed patient's old medical records in [place]  Chief Complaint: weakness an general malaise.  HPI: Emily Valentine is a 70 y.o. female with medical history significant of HTN, hypothyroidism, CKD stage 3b, COPD, rheumatoid arthritis (chronically on methotrexate) and COVID infection approximately a month ago treated with Paxlovid and steroids;  who presented to the emergency department with complaints of general malaise and ongoing weakness.  Patient expressed decreased appetite and oral intake.  Patient expressed visiting her primary care doctor who decided to treat patient with Zithromax and Medrol pack. Patient denies coughing spells, fever, chills, focal weakness, dysuria, hematuria, melena, hematochezia, or any other complaints. In the ED work-up demonstrated no acute cardiopulmonary process on chest x-ray, hypotension and lactic acidosis.  WBC is within normal limits, mild hypokalemia and mild troponin elevation.  ED Course: Given elevated lactic acid patient was started on empiric antibiotics after collecting blood cultures; fluid resuscitation per sepsis protocol initiated and [facility] hospital was contacted to place in the hospital for further evaluation and management.  Review of Systems: As per HPI otherwise all other systems reviewed and are negative.   Past Medical History:  Diagnosis Date   Diverticulitis 08/2016   History of echocardiogram    Echo (8/15): Vigorous LV function, EF 65-70%, normal wall motion, grade 1 diastolic dysfunction, mild to moderate TR, PASP 31 mm Hg   Hx of cardiovascular stress test    ETT-Myoview (8/15): no ischemia, EF 56%, normal study   Hypertension    Hypothyroid    Pernicious anemia    PVC's (premature ventricular contractions)    Rheumatoid arthritis ([place])    Right  bundle branch block    S/P colonoscopy 2001, 2004   2001: hyperplastic polyp, 2004: normal, inflammatory polyp    Past Surgical History:  Procedure Laterality Date   ABDOMINAL HYSTERECTOMY     BACK SURGERY     BREAST BIOPSY Right 10/29/2010   BREAST BIOPSY Right 11/27/2014   BREAST EXCISIONAL BIOPSY Right 02/21/2015   BREAST LUMPECTOMY WITH RADIOACTIVE SEED LOCALIZATION Right 02/21/2015   Procedure: RIGHT BREAST LUMPECTOMY WITH RADIOACTIVE SEED LOCALIZATION;  Surgeon: Rolm Bookbinder, MD;  Location: [facility];  Service: General;  Laterality: Right;   COLONOSCOPY  11/10/2010   Procedure: COLONOSCOPY;  Surgeon: Daneil Dolin, MD;  [facility];  Service: Endoscopy;  Laterality: N/A;   COLONOSCOPY N/A 09/28/2016   Dr. Gala Romney; Diverticulosis with evidence of recent diverticulitis.  Next colonoscopy in 2023 given history of tubular adenomas in the past.   THYROIDECTOMY      Social History  reports that she has quit smoking. Her smoking use included cigarettes. She has quit using smokeless tobacco. She reports that she does not drink alcohol and does not use drugs.  Allergies  Allergen Reactions   Codeine Nausea Only   Oxycontin [Oxycodone] Nausea And Vomiting    Family History  Problem Relation Age of Onset   Stomach cancer Mother        deceased   Anemia Mother    Cancer Mother    Hypertension Mother    Thyroid disease Mother    Colon cancer Father        diagnosed age 71, died at age 49   Anemia Father    Cancer Father    Hypertension Father  Anemia Sister    Cancer Brother    Diabetes Sister    Hypertension Sister    Hypertension Brother    Thyroid disease Brother    Thyroid disease Sister     Prior to Admission medications   Medication Sig Start Date End Date Taking? Authorizing Provider  acetaminophen (TYLENOL) 650 MG CR tablet Take 650 mg by mouth 2 (two) times daily as needed for pain.   Yes [provider]  albuterol  (VENTOLIN HFA) 108 (90 Base) MCG/ACT inhaler Inhale 1-2 puffs into the lungs every 6 (six) hours as needed for wheezing or shortness of breath.   Yes [provider]  Cyanocobalamin (VITAMIN B-12 IJ) Inject 1,000 mcg as directed every 30 (thirty) days.   Yes [provider]  hydrochlorothiazide (HYDRODIURIL) 25 MG tablet TAKE (1) TABLET BY MOUTH ONCE DAILY. Patient taking differently: Take 25 mg by mouth daily. 10/02/20  Yes Isaiah Serge, NP  hydrocortisone cream 1 % Apply 1 application topically daily as needed for itching.   Yes [provider]  levothyroxine (SYNTHROID, LEVOTHROID) 88 MCG tablet Take 88 mcg by mouth daily before breakfast.   Yes [provider]  losartan (COZAAR) 100 MG tablet Take 1 tablet (100 mg total) by mouth daily. 08/09/20  Yes Isaiah Serge, NP  methotrexate 2.5 MG tablet Take 2.5 mg by mouth once a week. Take 7 tabs once per week 06/09/18  Yes [provider]  metoprolol succinate (TOPROL-XL) 25 MG 24 hr tablet TAKE (1) TABLET BY MOUTH DAILY. Patient taking differently: Take 25 mg by mouth daily. 08/09/20  Yes Isaiah Serge, NP  Multiple Vitamins-Minerals (MULTIVITAMIN WITH MINERALS) tablet Take 1 tablet by mouth daily at 12 noon.    Yes [provider]  Plecanatide (TRULANCE) 3 MG TABS Take 3 mg by mouth daily. 11/06/20  Yes Aliene Altes S, PA-C  polyethylene glycol (MIRALAX / GLYCOLAX) packet Take 17 g by mouth daily as needed for mild constipation.   Yes [provider]  traMADol (ULTRAM) 50 MG tablet Take 50 mg by mouth as needed for moderate pain. 04/11/18  Yes [provider]  Wheat Dextrin (BENEFIBER) POWD Take 1 Dose by mouth as needed (stool).   Yes [provider]  aspirin EC 81 MG tablet Take 1 tablet (81 mg total) by mouth daily. Patient not taking: Reported on 12/17/2020 10/04/13   Richardson Dopp T, PA-C  nitroGLYCERIN (NITROSTAT) 0.4 MG SL tablet Place 1 tablet (0.4 mg total)  under the tongue every 5 (five) minutes as needed for chest pain. 08/09/20   Isaiah Serge, NP  umeclidinium-vilanterol (ANORO ELLIPTA) 62.5-25 MCG/INH AEPB Inhale 1 puff into the lungs daily. Patient not taking: No sig reported 07/30/20   Tanda Rockers, MD    Physical Exam: Vitals:   12/17/20 1515 12/17/20 1530 12/17/20 1600 12/17/20 1615  BP: (!) 92/59 104/89 90/60 (!) 86/52  Pulse: 84 87 83 81  Resp: (!) 25 (!) 26 (!) 25 (!) 23  Temp:      TempSrc:      SpO2: 100% 99% 100% 98%  Weight:      Height:        Constitutional: No chest pain, no nausea, no vomiting, good saturation on room air.  Patient expressed general malaise. Vitals:   12/17/20 1515 12/17/20 1530 12/17/20 1600 12/17/20 1615  BP: (!) 92/59 104/89 90/60 (!) 86/52  Pulse: 84 87 83 81  Resp: (!) 25 (!) 26 (!) 25 Marland Kitchen)  23  Temp:      TempSrc:      SpO2: 100% 99% 100% 98%  Weight:      Height:       Eyes: PERRL, lids and conjunctivae normal, no icterus, no nystagmus. ENMT: Mucous membranes are moist. Posterior pharynx clear of any exudate or lesions. Neck: normal, supple, no masses, no thyromegaly, no JVD. Respiratory: Good air movement bilaterally, positive scattered rhonchi; no using accessory muscle.  Good saturation on room air.  No wheezing appreciated on exam. Cardiovascular: Regular rate and rhythm, no rubs or gallops; no murmurs appreciated. Abdomen: no tenderness, no masses palpated. No hepatosplenomegaly. Bowel sounds positive.  Musculoskeletal: no clubbing / cyanosis. No joint deformity upper and lower extremities. Good ROM, no contractures. Normal muscle tone.  Skin: no rashes, no petechiae. Neurologic: CN 2-12 grossly intact.  No focal deficits appreciated.  4 out of 5 muscle strength appreciated bilaterally upper and lower extremities, in the setting of poor effort. Psychiatric: Normal judgment and insight. Alert and oriented x 3. Normal mood.    Labs on Admission: I have personally reviewed  following labs and imaging studies  CBC: Recent Labs  Lab 12/17/20 1121  WBC 8.9  NEUTROABS 6.1  HGB 13.6  HCT 39.1  MCV 96.5  PLT 086    Basic Metabolic Panel: Recent Labs  Lab 12/17/20 1121  NA 134*  K 3.0*  CL 97*  CO2 26  GLUCOSE 119*  BUN 27*  CREATININE 1.42*  CALCIUM 9.1    GFR: Estimated Creatinine Clearance: 30.5 mL/min (A) (by C-G formula based on SCr of 1.42 mg/dL (H)).  Liver Function Tests: Recent Labs  Lab 12/17/20 1121  AST 27  ALT 26  ALKPHOS 79  BILITOT 2.0*  PROT 7.9  ALBUMIN 3.4*    Urine analysis:    Component Value Date/Time   COLORURINE YELLOW 12/17/2020 1400   APPEARANCEUR CLEAR 12/17/2020 1400   LABSPEC 1.005 12/17/2020 1400   PHURINE 6.0 12/17/2020 1400   GLUCOSEU NEGATIVE 12/17/2020 1400   HGBUR MODERATE (A) 12/17/2020 1400   BILIRUBINUR NEGATIVE 12/17/2020 1400   KETONESUR NEGATIVE 12/17/2020 1400   PROTEINUR NEGATIVE 12/17/2020 1400   NITRITE NEGATIVE 12/17/2020 1400   LEUKOCYTESUR TRACE (A) 12/17/2020 1400    Radiological Exams on Admission: DG Chest Port 1 View  Result Date: 12/17/2020 CLINICAL DATA:  Pt was diagnosed with Covid on 11/18/20. Pt has been seen by her PCP few weeks ago but has continued to stay weak. Pt called her doctor today and they told her to come to the ED for further evaluation. EXAM: PORTABLE CHEST 1 VIEW COMPARISON:  11/18/2020. FINDINGS: Cardiac silhouette is normal in size. No mediastinal or hilar masses. Lungs are hyperexpanded. There are several linear opacities in the mid to lower lungs, most prominent in the left mid lung, consistent with scarring. No evidence of pneumonia or pulmonary edema. No pleural effusion or pneumothorax. Skeletal structures are grossly intact. IMPRESSION: 1. No acute cardiopulmonary disease. 2. Hyperexpanded lungs with areas of linear scarring, without change from the prior study. Electronically Signed   By: Lajean Manes M.D.   On: 12/17/2020 11:56    EKG: Independently  reviewed.  No acute ischemic changes, sinus rhythm.    Assessment/Plan 1-Lactic acidosis -In the setting of dehydration most likely -No signs of acute infection appreciated -Will continue fluid resuscitation -Continue holding diuretic regimen -Follow daily weights and I's and O's. -Cultures were taken and pending at this time; will hold on antibiotics given no  source of infection appreciated. -After fluid resuscitation repeat lactic acid 1.9. -PT evaluation requested  2-generalized weakness and hypotension -Will hold antihypertensive agents -Will continue fluid resuscitation -Checking TSH, vitamin D and cortisol level (last 1 with concerns of recent steroid usage with potential mild adrenal insufficiency). -Patient blood pressure is a stabilizing after fluid resuscitation initiated.  3-elevated troponin -Patient denies chest pain -2D echo demonstrating preserved ejection fraction and no wall motion abnormalities. -Will replete electrolytes and monitor on telemetry overnight.    4-hypokalemia -Will check magnesium and phosphorus level -Continue repletion and follow trend.  5-essential hypertension -Holding antihypertensive agents in the presence of hypotension currently. -Continue to maintain adequate hydration and follow vital signs.  6-Gastroesophageal flux disease -Continue PPI.  7-COPD -Currently denies shortness of breath no wheezing Some nasal congestion appreciated -Will resume home bronchodilators-start treatment with Flonase.  8-CKD stage 3b -will hold nephrotoxic agents -avoid hypotension and contrast  -continue IVF's -follow renal function trend  9-history of rheumatoid arthritis -Holding methotrexate currently.  10-hyponatremia, hypokalemia, hypochloremia -will hold diuretics -replete electrolytes -continue IVF's.    DVT prophylaxis: heparin Code Status:   Full Code Family Communication:  Husband at bedside Disposition Plan:   Patient is  from:  home  Anticipated DC to:  home  Anticipated DC date:  12/18/20  Anticipated DC barriers: Stabilization of BP  Consults called:  PCCM by EDP Admission status:  Observation, telemetry, LOS < 2 midnights.   Severity of Illness: The appropriate patient status for this patient is OBSERVATION. Observation status is judged to be reasonable and necessary in order to provide the required intensity of service to ensure the patient's safety. The patient's presenting symptoms, physical exam findings, and initial radiographic and laboratory data in the context of their medical condition is felt to place them at decreased risk for further clinical deterioration. Furthermore, it is anticipated that the patient will be medically stable for discharge from the hospital within 2 midnights of admission. The following factors support the patient status of observation.   " The patient's presenting symptoms include weakness, general malaise. " The physical exam findings include hypotension. " The initial radiographic and laboratory data are hypokalemia, elevated lactic acid and hypochloremia.Barton Dubois MD [facility]  How to contact the [facility] Attending or Consulting provider [place] or covering provider during after hours [place], for this patient?   Check the care team in [facility] and look for a) attending/consulting [facility] provider listed and b) the [facility] team listed Log into [email or web address] and use [place]'s universal password to access. If you do not have the password, please contact the hospital operator. Locate the [facility] provider you are looking for under [facility] and page to a number that you can be directly reached. If you still have difficulty reaching the provider, please page the [facility] (Director on Call) for the Hospitalists listed on amion for assistance.  12/17/2020, 5:28 PM

## 2020-12-17 NOTE — ED Notes (Signed)
Both sets of cultures obtained prior to antibiotic administration. EDP and pharmacy changing antibiotics twice- delay in starting.

## 2020-12-17 NOTE — ED Triage Notes (Signed)
Pt was diagnosed with Covid on 11/18/20. Pt has been seen by her PCP few weeks ago but has continued to stay weak. Pt called her doctor today and they told her to come to the ED for further evaluation.

## 2020-12-17 NOTE — ED Notes (Signed)
Called Echo tech and informed of order for echo.

## 2020-12-17 NOTE — Sepsis Progress Note (Signed)
Notified bedside nurse of need to draw repeat lactic acid After completing the fluid boluses.

## 2020-12-17 NOTE — Sepsis Progress Note (Signed)
Notified provider of need to order additional fluid bolus and collect a third Lactate.

## 2020-12-17 NOTE — Sepsis Progress Note (Signed)
Notified bedside nurse of need to administer antibiotics.  

## 2020-12-17 NOTE — Progress Notes (Signed)
*  PRELIMINARY RESULTS* Echocardiogram 2D Echocardiogram has been performed.  Emily Valentine 12/17/2020, 5:27 PM

## 2020-12-17 NOTE — ED Notes (Signed)
Meal tray given to patient.

## 2020-12-17 NOTE — Sepsis Progress Note (Signed)
Elink tracking the Code Sepsis. 

## 2020-12-18 DIAGNOSIS — R7989 Other specified abnormal findings of blood chemistry: Secondary | ICD-10-CM

## 2020-12-18 DIAGNOSIS — J449 Chronic obstructive pulmonary disease, unspecified: Secondary | ICD-10-CM | POA: Diagnosis not present

## 2020-12-18 DIAGNOSIS — E872 Acidosis, unspecified: Secondary | ICD-10-CM | POA: Diagnosis not present

## 2020-12-18 DIAGNOSIS — I10 Essential (primary) hypertension: Secondary | ICD-10-CM | POA: Diagnosis not present

## 2020-12-18 DIAGNOSIS — E039 Hypothyroidism, unspecified: Secondary | ICD-10-CM | POA: Diagnosis not present

## 2020-12-18 DIAGNOSIS — E861 Hypovolemia: Secondary | ICD-10-CM | POA: Diagnosis not present

## 2020-12-18 DIAGNOSIS — M6281 Muscle weakness (generalized): Secondary | ICD-10-CM

## 2020-12-18 DIAGNOSIS — I9589 Other hypotension: Secondary | ICD-10-CM | POA: Diagnosis not present

## 2020-12-18 DIAGNOSIS — E860 Dehydration: Secondary | ICD-10-CM | POA: Diagnosis not present

## 2020-12-18 DIAGNOSIS — E876 Hypokalemia: Secondary | ICD-10-CM | POA: Diagnosis not present

## 2020-12-18 LAB — BASIC METABOLIC PANEL
Anion gap: 6 (ref 5–15)
BUN: 17 mg/dL (ref 8–23)
CO2: 26 mmol/L (ref 22–32)
Calcium: 8.3 mg/dL — ABNORMAL LOW (ref 8.9–10.3)
Chloride: 108 mmol/L (ref 98–111)
Creatinine, Ser: 1.02 mg/dL — ABNORMAL HIGH (ref 0.44–1.00)
GFR, Estimated: 59 mL/min — ABNORMAL LOW (ref >60)
Glucose, Bld: 81 mg/dL (ref 70–99)
Potassium: 3.9 mmol/L (ref 3.5–5.1)
Sodium: 140 mmol/L (ref 135–145)

## 2020-12-18 LAB — CBC
HCT: 29.3 % — ABNORMAL LOW (ref 36.0–46.0)
Hemoglobin: 10.3 g/dL — ABNORMAL LOW (ref 12.0–15.0)
MCH: 34.8 pg — ABNORMAL HIGH (ref 26.0–34.0)
MCHC: 35.2 g/dL (ref 30.0–36.0)
MCV: 99 fL (ref 80.0–100.0)
Platelets: 207 10*3/uL (ref 150–400)
RBC: 2.96 MIL/uL — ABNORMAL LOW (ref 3.87–5.11)
RDW: 16.4 % — ABNORMAL HIGH (ref 11.5–15.5)
WBC: 5.7 10*3/uL (ref 4.0–10.5)
nRBC: 0 % (ref 0.0–0.2)

## 2020-12-18 LAB — HIV ANTIBODY (ROUTINE TESTING W REFLEX): HIV Screen 4th Generation wRfx: NONREACTIVE

## 2020-12-18 NOTE — Evaluation (Signed)
Physical Therapy Evaluation Patient Details Name: MALEIGHA COLVARD MRN: [id number] DOB: 08-03-1950 Today's Date: 12/18/2020  History of Present Illness  Ellerie Arenz Landen is a 70 y.o. female with medical history significant of HTN, hypothyroidism, CKD stage 3b, COPD, rheumatoid arthritis (chronically on methotrexate) and COVID infection approximately a month ago treated with Paxlovid and steroids;  who presented to the emergency department with complaints of general malaise and ongoing weakness.  Patient expressed decreased appetite and oral intake.  Patient expressed visiting her primary care doctor who decided to treat patient with Zithromax and Medrol pack. Patient denies coughing spells, fever, chills, focal weakness, dysuria, hematuria, melena, hematochezia, or any other complaints.  In the ED work-up demonstrated no acute cardiopulmonary process on chest x-ray, hypotension and lactic acidosis.  WBC is within normal limits, mild hypokalemia and mild troponin elevation.   Clinical Impression  Patient demonstrates labored unsteady movement with near loss of balance during initial sit to stand, has tendency to lean on nearby objects for support during ambulation, safer using SPC demonstrating fair/good return for use with mostly step-to pattern without loss of balance.  Plan:  patient to be discharged from hospital to day and discharged from physical therapy to care of nursing for ambulation daily as tolerated for length of stay.        Recommendations for follow up therapy are one component of a multi-disciplinary discharge planning process, led by the attending physician.  Recommendations may be updated based on patient status, additional functional criteria and insurance authorization.  Follow Up Recommendations Home health PT;Supervision for mobility/OOB;Supervision - Intermittent    Equipment Recommendations  Cane    Recommendations for Other Services       Precautions / Restrictions  Precautions Precautions: Fall Restrictions Weight Bearing Restrictions: No      Mobility  Bed Mobility Overal bed mobility: Modified Independent                  Transfers Overall transfer level: Needs assistance Equipment used: None Transfers: Sit to/from Stand;Stand Pivot Transfers Sit to Stand: Supervision;Min guard Stand pivot transfers: Supervision       General transfer comment: slightly unsteady with near loss of balance during inital sit to stand without AD, required use of SPC for safety  Ambulation/Gait Ambulation/Gait assistance: Supervision;Min guard Gait Distance (Feet): 75 Feet Assistive device: None;Straight cane Gait Pattern/deviations: Decreased step length - right;Decreased step length - left;Decreased stride length Gait velocity: decreased   General Gait Details: slightly unsteady labored cadence with decreased arm swing, has to lean on nearby objects for support, safer using SPC with fair/good return for use with mostly 3 point gait pattern, no loss of balance  Stairs            Wheelchair Mobility    Modified Rankin (Stroke Patients Only)       Balance Overall balance assessment: Needs assistance Sitting-balance support: Feet supported;No upper extremity supported Sitting balance-Leahy Scale: Good Sitting balance - Comments: seated at EOB   Standing balance support: During functional activity;No upper extremity supported   Standing balance comment: fair/poor without AD, fair/good using SPC                             Pertinent Vitals/Pain Pain Assessment: No/denies pain    Home Living Family/patient expects to be discharged to:: Private residence Living Arrangements: Spouse/significant other Available Help at Discharge: Family;Available 24 hours/day Type of Home: House Home Access: Stairs to enter  Entrance Stairs-Rails: Right Entrance Stairs-Number of Steps: 4 Home Layout: One level Home Equipment: Grab bars  - tub/shower;Bedside commode;Shower seat      Prior Function Level of Independence: Independent         Comments: Hydrographic surveyor, drives     Hand Dominance   Dominant Hand: Right    Extremity/Trunk Assessment   Upper Extremity Assessment Upper Extremity Assessment: Overall WFL for tasks assessed    Lower Extremity Assessment Lower Extremity Assessment: Generalized weakness    Cervical / Trunk Assessment Cervical / Trunk Assessment: Normal  Communication   Communication: No difficulties  Cognition Arousal/Alertness: Awake/alert Behavior During Therapy: WFL for tasks assessed/performed Overall Cognitive Status: Within Functional Limits for tasks assessed                                        General Comments      Exercises     Assessment/Plan    PT Assessment All further PT needs can be met in the next venue of care  PT Problem List Decreased strength;Decreased activity tolerance;Decreased balance;Decreased mobility       PT Treatment Interventions      PT Goals (Current goals can be found in the Care Plan section)  Acute Rehab PT Goals Patient Stated Goal: return home with family to asisst PT Goal Formulation: With patient Time For Goal Achievement: 12/18/20 Potential to Achieve Goals: Good    Frequency     Barriers to discharge        Co-evaluation               AM-PAC PT "6 Clicks" Mobility  Outcome Measure Help needed turning from your back to your side while in a flat bed without using bedrails?: None Help needed moving from lying on your back to sitting on the side of a flat bed without using bedrails?: None Help needed moving to and from a bed to a chair (including a wheelchair)?: A Little Help needed standing up from a chair using your arms (e.g., wheelchair or bedside chair)?: A Little Help needed to walk in hospital room?: A Little Help needed climbing 3-5 steps with a railing? : A Lot 6 Click Score: 19     End of Session   Activity Tolerance: Patient tolerated treatment well;Patient limited by fatigue Patient left: in bed;with call bell/phone within reach;with family/visitor present Nurse Communication: Mobility status PT Visit Diagnosis: Unsteadiness on feet (R26.81);Other abnormalities of gait and mobility (R26.89);Muscle weakness (generalized) (M62.81)    Time: 2395-3202 PT Time Calculation (min) (ACUTE ONLY): 20 min   Charges:   PT Evaluation $PT Eval Moderate Complexity: 1 Mod PT Treatments $Gait Training: 8-22 mins        11:12 AM, 12/18/20 Lonell Grandchild, MPT Physical Therapist with [facility] [phone] office [phone] mobile phone

## 2020-12-18 NOTE — Discharge Instructions (Signed)
Discontinue home hydrochlorothiazide and losartan due to hypotension.  May continue home metoprolol 25 mg p.o. daily.  Recommend daily monitoring of BP and take log of blood pressure measurements to next PCP/cardiology visit.

## 2020-12-18 NOTE — Discharge Summary (Signed)
Physician Discharge Summary  Emily Valentine QIO:[id number] DOB: [birth year] DOA: 12/17/2020  PCP: Sharilyn Sites, MD  Admit date: 12/17/2020 Discharge date: 12/18/2020  Admitted From: Home Disposition: Home  Recommendations for Outpatient Follow-up:  Follow up with PCP in 1-2 weeks Discontinued home hydrochlorothiazide and losartan due to hypotension Please obtain BMP in one week to ensure renal function remains at baseline Recommend monitoring home BP and bring log to next PCP/cardiology visit  Home Health: No Equipment/Devices: None  Discharge Condition: Stable CODE STATUS: Full code Diet recommendation: Heart healthy diet  History of present illness:  Emily Valentine is a 70 y.o. female with medical history significant of HTN, hypothyroidism, CKD stage 3b, COPD, rheumatoid arthritis (chronically on methotrexate) and COVID infection approximately a month ago treated with Paxlovid and steroids;  who presented to the emergency department with complaints of general malaise and ongoing weakness.  Patient expressed decreased appetite and oral intake.  Patient expressed visiting her primary care doctor who decided to treat patient with Zithromax and Medrol pack. Patient denies coughing spells, fever, chills, focal weakness, dysuria, hematuria, melena, hematochezia, or any other complaints.  In the ED, patient with notable hypotension of 70/49.  Chest x-ray with no acute cardiopulmonary process. Lactic acid elevated.  WBC is within normal limits, mild hypokalemia and mild troponin elevation. Given elevated lactic acid patient was started on empiric antibiotics after collecting blood cultures; fluid resuscitation per sepsis protocol initiated and [facility] hospital was contacted to place in the hospital for further evaluation and management.  Hospital course:  Hypotension Lactic acidosis Patient presents ED with generalized malaise, weakness.  In the setting of poor appetite and oral intake.   Recently treated outpatient by PCP with Zithromax and Medrol Dosepak.  Patient was notably hypotensive with a blood pressure 70/49 on admission.  Patient on losartan 100 mg p.o. daily, hydrochlorothiazide 25 mg p.o. daily metoprolol succinate 25 mg p.o. daily.  These antihypertensives were held on admission.  Patient was given IV fluid hydration with improvement of symptoms and blood pressure to 127/74 at time of discharge.  We will discontinue home HCTZ and losartan.  May continue home metoprolol succinate 25 mg p.o. daily.  Recommended daily blood pressure log to bring to next PCP/cardiology visit.  Elevated troponin High sensitive troponin 58 followed by 54, flat.  TTE with LVEF 70-25% with no LV regional wall motion normalities.  Etiology likely secondary to type II demand ischemia in the setting of hypotension.  Denies chest pain.  Generalized weakness Patient presenting with generalized weakness and fatigue.  TSH and cortisol level within normal limits.  Patient is afebrile without leukocytosis, urinalysis unrevealing; likely infectious process.  Likely related to her underlying hypertension and aggressive antihypertensive regimen.  Patient was given IV fluid hydration and her antihypertensives were held with improvement of her symptoms and blood pressure.  Patient was able to ambulate in the room, to the bathroom without assistance or much issue at time of discharge.  Hypokalemia Potassium 3.0 on admission, likely secondary to poor oral intake in the preceding days from hospitalization.  Patient was given supplementation with improvement of potassium to 3.9 at time of discharge.  Sepsis, ruled out Upon initial presentation, given patient's hypotension, generalized malaise; thought may be related to infectious process.  Patient is afebrile without leukocytosis.  Urinalysis unrevealing.  Chest x-ray with no acute cardiopulmonary disease process.  Patient's symptoms improved with IV fluid hydration.   No infectious etiology elucidated.  Essential hypertension Discontinued home hydrochlorothiazide and losartan.  Continue metoprolol succinate 25 mg p.o. daily.  Continue aspirin.  Instructed to maintain BP log to bring to next PCP/cardiology visit.  Hypothyroidism: TSH 1.365, within normal limits.  Continue levothyroxine 88 mcg p.o. daily.  Rheumatoid arthritis: Continue methotrexate 2.5 mg once weekly.  Outpatient follow-up with rheumatology/PCP.  COPD: Continue Anoro Ellipta inhaler, albuterol MDI as needed.  Discharge Diagnoses:  Active Problems:   * No active hospital problems. *    Discharge Instructions  Discharge Instructions     Call MD for:  difficulty breathing, headache or visual disturbances   Complete by: As directed    Call MD for:  extreme fatigue   Complete by: As directed    Call MD for:  persistant dizziness or light-headedness   Complete by: As directed    Call MD for:  persistant nausea and vomiting   Complete by: As directed    Call MD for:  severe uncontrolled pain   Complete by: As directed    Call MD for:  temperature >100.4   Complete by: As directed    Diet - low sodium heart healthy   Complete by: As directed    Increase activity slowly   Complete by: As directed       Allergies as of 12/18/2020       Reactions   Codeine Nausea Only   Oxycontin [oxycodone] Nausea And Vomiting        Medication List     STOP taking these medications    aspirin EC 81 MG tablet   hydrochlorothiazide 25 MG tablet Commonly known as: HYDRODIURIL   losartan 100 MG tablet Commonly known as: COZAAR       TAKE these medications    acetaminophen 650 MG CR tablet Commonly known as: TYLENOL Take 650 mg by mouth 2 (two) times daily as needed for pain.   albuterol 108 (90 Base) MCG/ACT inhaler Commonly known as: VENTOLIN HFA Inhale 1-2 puffs into the lungs every 6 (six) hours as needed for wheezing or shortness of breath.   Anoro Ellipta 62.5-25  MCG/INH Aepb Generic drug: umeclidinium-vilanterol Inhale 1 puff into the lungs daily.   Benefiber Powd Take 1 Dose by mouth as needed (stool).   hydrocortisone cream 1 % Apply 1 application topically daily as needed for itching.   levothyroxine 88 MCG tablet Commonly known as: SYNTHROID Take 88 mcg by mouth daily before breakfast.   methotrexate 2.5 MG tablet Take 2.5 mg by mouth once a week. Take 7 tabs once per week   metoprolol succinate 25 MG 24 hr tablet Commonly known as: TOPROL-XL TAKE (1) TABLET BY MOUTH DAILY. What changed:  how much to take how to take this when to take this additional instructions   multivitamin with minerals tablet Take 1 tablet by mouth daily at 12 noon.   nitroGLYCERIN 0.4 MG SL tablet Commonly known as: Nitrostat Place 1 tablet (0.4 mg total) under the tongue every 5 (five) minutes as needed for chest pain.   polyethylene glycol 17 g packet Commonly known as: MIRALAX / GLYCOLAX Take 17 g by mouth daily as needed for mild constipation.   traMADol 50 MG tablet Commonly known as: ULTRAM Take 50 mg by mouth as needed for moderate pain.   Trulance 3 MG Tabs Generic drug: Plecanatide Take 3 mg by mouth daily.   VITAMIN B-12 IJ Inject 1,000 mcg as directed every 30 (thirty) days.        Follow-up Information     Sharilyn Sites, MD.  Schedule an appointment as soon as possible for a visit in 1 week(s).   Specialty: Family Medicine Contact information: [address] [place] [address] [phone]         Josue Hector, MD .   Specialty: Cardiology Contact information: [phone] [place] [address] [phone]                Allergies  Allergen Reactions   Codeine Nausea Only   Oxycontin [Oxycodone] Nausea And Vomiting    Consultations: Pulmonary critical care medicine   Procedures/Studies: DG Chest 2 View  Result Date: 11/18/2020 CLINICAL DATA:  Diminished breath  sounds. Generalized weakness. Headache. EXAM: CHEST - 2 VIEW COMPARISON:  CT chest 10/04/2020. FINDINGS: Heart size normal. Atherosclerotic changes are noted at the aortic arch. Changes of COPD stable. No edema or effusion. No focal airspace disease. IMPRESSION: 1. No acute cardiopulmonary disease. 2. Stable changes of COPD. Electronically Signed   By: San Morelle M.D.   On: 11/18/2020 11:25   DG Chest Port 1 View  Result Date: 12/17/2020 CLINICAL DATA:  Pt was diagnosed with Covid on 11/18/20. Pt has been seen by her PCP few weeks ago but has continued to stay weak. Pt called her doctor today and they told her to come to the ED for further evaluation. EXAM: PORTABLE CHEST 1 VIEW COMPARISON:  11/18/2020. FINDINGS: Cardiac silhouette is normal in size. No mediastinal or hilar masses. Lungs are hyperexpanded. There are several linear opacities in the mid to lower lungs, most prominent in the left mid lung, consistent with scarring. No evidence of pneumonia or pulmonary edema. No pleural effusion or pneumothorax. Skeletal structures are grossly intact. IMPRESSION: 1. No acute cardiopulmonary disease. 2. Hyperexpanded lungs with areas of linear scarring, without change from the prior study. Electronically Signed   By: Lajean Manes M.D.   On: 12/17/2020 11:56   ECHOCARDIOGRAM COMPLETE  Result Date: 12/17/2020    ECHOCARDIOGRAM REPORT   Patient Name:   TONYE TANCREDI Date of Exam: 12/17/2020 Medical Rec #:  [id number]       Height:       63.0 in Accession #:    [id number]      Weight:       119.0 lb Date of Birth:  Aug 12, 1950       BSA:          1.551 m Patient Age:    34 years        BP:           90/60 mmHg Patient Gender: F               HR:           83 bpm. Exam Location:  [facility] Procedure: 2D Echo, Cardiac Doppler and Color Doppler Indications:    I42.9 Cardiomyopathy (unspecified)  History:        Patient has prior history of Echocardiogram examinations, most                 recent 10/16/2013.  COPD, Arrythmias:RBBB; Risk                 Factors:Hypertension and Current Smoker. COVID + one month ago.  Sonographer:    Wenda Low Referring Phys: [place] [facility] IMPRESSIONS  1. Left ventricular ejection fraction, by estimation, is 70 to 75%. The left ventricle has hyperdynamic function. The left ventricle has no regional wall motion abnormalities. There is moderate left ventricular hypertrophy.  Left ventricular diastolic parameters were normal.  2. Right ventricular systolic function is mildly reduced. The right ventricular size is moderately enlarged. There is normal pulmonary artery systolic pressure. The estimated right ventricular systolic pressure is 25.6 mmHg.  3. Right atrial size was mild to moderately dilated.  4. The mitral valve is grossly normal. Trivial mitral valve regurgitation.  5. Tricuspid valve regurgitation is mild to moderate.  6. The aortic valve is tricuspid. Aortic valve regurgitation is not visualized. No aortic stenosis is present. Aortic valve mean gradient measures 4.0 mmHg.  7. The inferior vena cava is normal in size with greater than 50% respiratory variability, suggesting right atrial pressure of 3 mmHg. Comparison(s): Prior images unable to be directly viewed. FINDINGS  Left Ventricle: Left ventricular ejection fraction, by estimation, is 70 to 75%. The left ventricle has hyperdynamic function. The left ventricle has no regional wall motion abnormalities. The left ventricular internal cavity size was small. There is moderate left ventricular hypertrophy. Left ventricular diastolic parameters were normal. Right Ventricle: The right ventricular size is moderately enlarged. No increase in right ventricular wall thickness. Right ventricular systolic function is mildly reduced. There is normal pulmonary artery systolic pressure. The tricuspid regurgitant velocity is 2.64 m/s, and with an assumed right atrial pressure of 3 mmHg, the estimated right ventricular systolic  pressure is 38.9 mmHg. Left Atrium: Left atrial size was normal in size. Right Atrium: Right atrial size was mild to moderately dilated. Pericardium: There is no evidence of pericardial effusion. Mitral Valve: The mitral valve is grossly normal. Mild mitral annular calcification. Trivial mitral valve regurgitation. MV peak gradient, 3.1 mmHg. The mean mitral valve gradient is 1.0 mmHg. Tricuspid Valve: The tricuspid valve is grossly normal. Tricuspid valve regurgitation is mild to moderate. Aortic Valve: The aortic valve is tricuspid. There is mild aortic valve annular calcification. Aortic valve regurgitation is not visualized. No aortic stenosis is present. Aortic valve mean gradient measures 4.0 mmHg. Aortic valve peak gradient measures 8.6 mmHg. Aortic valve area, by VTI measures 2.32 cm. Pulmonic Valve: The pulmonic valve was grossly normal. Pulmonic valve regurgitation is trivial. Aorta: The aortic root is normal in size and structure. Venous: The inferior vena cava is normal in size with greater than 50% respiratory variability, suggesting right atrial pressure of 3 mmHg. IAS/Shunts: No atrial level shunt detected by color flow Doppler.  LEFT VENTRICLE PLAX 2D LVIDd:         3.70 cm  Diastology LVIDs:         1.90 cm  LV e' medial:    7.72 cm/s LV PW:         1.30 cm  LV E/e' medial:  9.5 LV IVS:        1.40 cm  LV e' lateral:   8.27 cm/s LVOT diam:     2.00 cm  LV E/e' lateral: 8.9 LV SV:         61 LV SV Index:   40 LVOT Area:     3.14 cm  RIGHT VENTRICLE RV Basal diam:  3.95 cm RV Mid diam:    2.50 cm RV S prime:     11.00 cm/s TAPSE (M-mode): 2.2 cm LEFT ATRIUM             Index       RIGHT ATRIUM           Index LA diam:        2.80 cm 1.81 cm/m  RA Area:  18.70 cm LA Vol (A2C):   39.3 ml 25.34 ml/m RA Volume:   56.50 ml  36.43 ml/m LA Vol (A4C):   10.4 ml 6.71 ml/m LA Biplane Vol: 21.5 ml 13.86 ml/m  AORTIC VALVE AV Area (Vmax):    1.84 cm AV Area (Vmean):   1.93 cm AV Area (VTI):     2.32  cm AV Vmax:           147.00 cm/s AV Vmean:          89.700 cm/s AV VTI:            0.264 m AV Peak Grad:      8.6 mmHg AV Mean Grad:      4.0 mmHg LVOT Vmax:         86.20 cm/s LVOT Vmean:        55.000 cm/s LVOT VTI:          0.195 m LVOT/AV VTI ratio: 0.74  AORTA Ao Root diam: 3.00 cm MITRAL VALVE               TRICUSPID VALVE MV Area (PHT): 3.30 cm    TR Peak grad:   27.9 mmHg MV Area VTI:   2.69 cm    TR Vmax:        264.00 cm/s MV Peak grad:  3.1 mmHg MV Mean grad:  1.0 mmHg    SHUNTS MV Vmax:       0.88 m/s    Systemic VTI:  0.20 m MV Vmean:      54.9 cm/s   Systemic Diam: 2.00 cm MV Decel Time: 230 msec MV E velocity: 73.30 cm/s MV A velocity: 78.40 cm/s MV E/A ratio:  0.93 Rozann Lesches MD Electronically signed by Rozann Lesches MD Signature Date/Time: 12/17/2020/5:33:46 PM    Final    CT Renal Stone Study  Result Date: 11/18/2020 CLINICAL DATA:  Pt c/o increased weakness x 1 week. EXAM: CT ABDOMEN AND PELVIS WITHOUT CONTRAST TECHNIQUE: Multidetector CT imaging of the abdomen and pelvis was performed following the standard protocol without IV contrast. COMPARISON:  CT abdomen pelvis 08/24/2016 FINDINGS: Lower chest: No acute abnormality. Evaluation of the abdominal viscera is limited by the lack of IV contrast. Hepatobiliary: No focal liver abnormality is seen. Multiple calcified gallstones. No gallbladder wall thickening or pericholecystic fluid. Pancreas: Unremarkable. No surrounding inflammatory changes. Spleen: Normal in size without focal abnormality. Adrenals/Urinary Tract: Adrenal glands are unremarkable. No renal calculi or hydronephrosis identified. No large mass lesion. Urinary bladder is unremarkable. Stomach/Bowel: Stomach is within normal limits. Appendix appears normal. There is probable persistent bowel wall thickening in the sigmoid colon. No evidence of obstruction. Multiple colonic diverticula. Vascular/Lymphatic: Aortic atherosclerosis. Vascular patency can not be assessed in the  absence of IV contrast no enlarged abdominal or pelvic lymph nodes. Reproductive: Status post hysterectomy. No adnexal masses. Other: No abdominal wall hernia or abnormality. No abdominopelvic ascites. Musculoskeletal: Stable grade 1 anterolisthesis of L4 on L5. IMPRESSION: 1. No renal calculi or hydronephrosis. 2. There is probable persistent bowel wall thickening in the sigmoid colon, which may represent sequela of chronic diverticular disease. 3. Cholelithiasis without evidence of acute cholecystitis. 4. Aortic atherosclerosis. Aortic Atherosclerosis (ICD10-I70.0). Electronically Signed   By: Audie Pinto M.D.   On: 11/18/2020 15:49     Subjective: Patient seen examined bedside, resting comfortably.  Eating breakfast.  Was able to ambulate in the room into the bathroom this morning without issue.  Feels back to her normal baseline.  Blood pressure improved with IV fluids and holding home antihypertensives.  Discussed with patient needs to hold her home HCTZ and losartan and may resume her metoprolol on discharge until follows up with her PCP or cardiologist.  No other questions or concerns at this time.  Denies headache, no visual changes, no chest pain, palpitations, no shortness of breath, no fever/chills/night sweats, no abdominal pain.  No nausea/vomiting/diarrhea, no current weakness/fatigue, no paresthesias.  No acute events overnight per nursing.  Discharge Exam: Vitals:   12/18/20 0450 12/18/20 0851  BP: 127/74   Pulse: 82   Resp: 20   Temp: 98.2 F (36.8 C)   SpO2: 100% 97%   Vitals:   12/18/20 0300 12/18/20 0415 12/18/20 0450 12/18/20 0851  BP: 108/67 112/72 127/74   Pulse: 70 72 82   Resp: 20 20 20    Temp:  98.4 F (36.9 C) 98.2 F (36.8 C)   TempSrc:  Oral Oral   SpO2: 100% 100% 100% 97%  Weight:   55.2 kg   Height:   5\' 5"  (1.651 m)     General: Pt is alert, awake, not in acute distress Cardiovascular: RRR, S1/S2 +, no rubs, no gallops Respiratory: CTA  bilaterally, no wheezing, no rhonchi, on room air Abdominal: Soft, NT, ND, bowel sounds + Extremities: no edema, no cyanosis    The results of significant diagnostics from this hospitalization (including imaging, microbiology, ancillary and laboratory) are listed below for reference.     Microbiology: Recent Results (from the past 240 hour(s))  Blood Culture (routine x 2)     Status: None (Preliminary result)   Collection Time: 12/17/20 11:21 AM   Specimen: Left Antecubital; Blood  Result Value Ref Range Status   Specimen Description LEFT ANTECUBITAL  Final   Special Requests   Final    Blood Culture adequate volume BOTTLES DRAWN AEROBIC AND ANAEROBIC Performed at [facility], [address] [place], [place] [address]    Culture PENDING  Incomplete   Report Status PENDING  Incomplete  Blood Culture (routine x 2)     Status: None (Preliminary result)   Collection Time: 12/17/20 11:44 AM   Specimen: BLOOD LEFT HAND  Result Value Ref Range Status   Specimen Description BLOOD LEFT HAND  Final   Special Requests   Final    Blood Culture adequate volume BOTTLES DRAWN AEROBIC ONLY Performed at [facility], [address] [place], [place] [address]    Culture PENDING  Incomplete   Report Status PENDING  Incomplete     Labs: BNP (last 3 results) No results for input(s): BNP in the last 8760 hours. Basic Metabolic Panel: Recent Labs  Lab 12/17/20 1121 12/17/20 1131 12/18/20 0600  NA 134*  --  140  K 3.0*  --  3.9  CL 97*  --  108  CO2 26  --  26  GLUCOSE 119*  --  81  BUN 27*  --  17  CREATININE 1.42*  --  1.02*  CALCIUM 9.1  --  8.3*  MG  --  1.9  --   PHOS  --  3.6  --    Liver Function Tests: Recent Labs  Lab 12/17/20 1121  AST 27  ALT 26  ALKPHOS 79  BILITOT 2.0*  PROT 7.9  ALBUMIN 3.4*   No results for input(s): LIPASE, AMYLASE in the last 168 hours. No results for input(s): AMMONIA in the last 168 hours. CBC: Recent Labs  Lab 12/17/20 1121  12/18/20 0600  WBC 8.9 5.7  NEUTROABS 6.1  --   HGB 13.6 10.3*  HCT 39.1 29.3*  MCV 96.5 99.0  PLT 238 207   Cardiac Enzymes: No results for input(s): CKTOTAL, CKMB, CKMBINDEX, TROPONINI in the last 168 hours. BNP: Invalid input(s): POCBNP CBG: No results for input(s): GLUCAP in the last 168 hours. D-Dimer No results for input(s): DDIMER in the last 72 hours. Hgb A1c No results for input(s): HGBA1C in the last 72 hours. Lipid Profile No results for input(s): CHOL, HDL, LDLCALC, TRIG, CHOLHDL, LDLDIRECT in the last 72 hours. Thyroid function studies Recent Labs    12/17/20 1131  TSH 1.368   Anemia work up Recent Labs    12/17/20 1131  VITAMINB12 946*   Urinalysis    Component Value Date/Time   COLORURINE YELLOW 12/17/2020 1400   APPEARANCEUR CLEAR 12/17/2020 1400   LABSPEC 1.005 12/17/2020 1400   PHURINE 6.0 12/17/2020 1400   GLUCOSEU NEGATIVE 12/17/2020 1400   HGBUR MODERATE (A) 12/17/2020 1400   BILIRUBINUR NEGATIVE 12/17/2020 1400   KETONESUR NEGATIVE 12/17/2020 1400   PROTEINUR NEGATIVE 12/17/2020 1400   NITRITE NEGATIVE 12/17/2020 1400   LEUKOCYTESUR TRACE (A) 12/17/2020 1400   Sepsis Labs Invalid input(s): PROCALCITONIN,  WBC,  LACTICIDVEN Microbiology Recent Results (from the past 240 hour(s))  Blood Culture (routine x 2)     Status: None (Preliminary result)   Collection Time: 12/17/20 11:21 AM   Specimen: Left Antecubital; Blood  Result Value Ref Range Status   Specimen Description LEFT ANTECUBITAL  Final   Special Requests   Final    Blood Culture adequate volume BOTTLES DRAWN AEROBIC AND ANAEROBIC Performed at [facility], [address] [place], [place] [address]    Culture PENDING  Incomplete   Report Status PENDING  Incomplete  Blood Culture (routine x 2)     Status: None (Preliminary result)   Collection Time: 12/17/20 11:44 AM   Specimen: BLOOD LEFT HAND  Result Value Ref Range Status   Specimen Description BLOOD LEFT HAND  Final    Special Requests   Final    Blood Culture adequate volume BOTTLES DRAWN AEROBIC ONLY Performed at [facility], [address] [place], [place] [address]    Culture PENDING  Incomplete   Report Status PENDING  Incomplete     Time coordinating discharge: Over 30 minutes  SIGNED:   Shayn Madole J [place] DO  [facility] 12/18/2020, 9:22 AM

## 2020-12-23 LAB — CULTURE, BLOOD (ROUTINE X 2)
Culture: NO GROWTH
Culture: NO GROWTH
Special Requests: ADEQUATE
Special Requests: ADEQUATE

## 2020-12-24 DIAGNOSIS — J439 Emphysema, unspecified: Secondary | ICD-10-CM | POA: Diagnosis not present

## 2020-12-24 DIAGNOSIS — D510 Vitamin B12 deficiency anemia due to intrinsic factor deficiency: Secondary | ICD-10-CM | POA: Diagnosis not present

## 2020-12-24 DIAGNOSIS — E559 Vitamin D deficiency, unspecified: Secondary | ICD-10-CM | POA: Diagnosis not present

## 2020-12-24 DIAGNOSIS — E063 Autoimmune thyroiditis: Secondary | ICD-10-CM | POA: Diagnosis not present

## 2020-12-24 DIAGNOSIS — E039 Hypothyroidism, unspecified: Secondary | ICD-10-CM | POA: Diagnosis not present

## 2020-12-24 DIAGNOSIS — U071 COVID-19: Secondary | ICD-10-CM | POA: Diagnosis not present

## 2020-12-24 DIAGNOSIS — M069 Rheumatoid arthritis, unspecified: Secondary | ICD-10-CM | POA: Diagnosis not present

## 2020-12-24 DIAGNOSIS — J449 Chronic obstructive pulmonary disease, unspecified: Secondary | ICD-10-CM | POA: Diagnosis not present

## 2020-12-24 DIAGNOSIS — I10 Essential (primary) hypertension: Secondary | ICD-10-CM | POA: Diagnosis not present

## 2021-01-09 ENCOUNTER — Telehealth: Payer: Self-pay | Admitting: Cardiovascular Disease

## 2021-01-09 NOTE — Telephone Encounter (Signed)
Called patient back about her message. Patient complaining of swelling in her feet that comes on through out the day and then goes down over night. Patient had a recent echo done at [facility]. Will have Dr. Johnsie Cancel review it and see if there is any cause for edema from a heart standpoint. Encouraged patient to elevated her feet when they start to swell and reduce her salt intake. Will forward to Dr. Johnsie Cancel for further advisement.

## 2021-01-09 NOTE — Telephone Encounter (Signed)
Pt c/o swelling: STAT is pt has developed SOB within 24 hours  How much weight have you gained and in what time span? PT DOES NOT WEIGH HERSELF DAILY  If swelling, where is the swelling located? BILATERAL FEET SWELLING   Are you currently taking a fluid pill? PT IS NOT TAKING A MY FLUID MEDS  Are you currently SOB? NO  Do you have a log of your daily weights (if so, list)? PT DOES NOT WEIGH HERSELF DAILY  Have you gained 3 pounds in a day or 5 pounds in a week? N/A  Have you traveled recently? NO  PT STATES SHE'S NOT SWOLLEN NOW BUT SHE DOES SWELL UP THROUGH OUT THE DAY

## 2021-01-13 NOTE — Telephone Encounter (Signed)
Called patient back with Dr. Kyla Balzarine advisement, "Echo was fine with normal EF edema not from heart f/u primary". Patient verbalized understanding and will follow up with her primary doctor.

## 2021-01-15 DIAGNOSIS — J449 Chronic obstructive pulmonary disease, unspecified: Secondary | ICD-10-CM | POA: Diagnosis not present

## 2021-01-15 DIAGNOSIS — E039 Hypothyroidism, unspecified: Secondary | ICD-10-CM | POA: Diagnosis not present

## 2021-01-15 DIAGNOSIS — U071 COVID-19: Secondary | ICD-10-CM | POA: Diagnosis not present

## 2021-01-15 DIAGNOSIS — E559 Vitamin D deficiency, unspecified: Secondary | ICD-10-CM | POA: Diagnosis not present

## 2021-01-15 DIAGNOSIS — D510 Vitamin B12 deficiency anemia due to intrinsic factor deficiency: Secondary | ICD-10-CM | POA: Diagnosis not present

## 2021-01-15 DIAGNOSIS — J439 Emphysema, unspecified: Secondary | ICD-10-CM | POA: Diagnosis not present

## 2021-01-15 DIAGNOSIS — Z6822 Body mass index (BMI) 22.0-22.9, adult: Secondary | ICD-10-CM | POA: Diagnosis not present

## 2021-01-15 DIAGNOSIS — I10 Essential (primary) hypertension: Secondary | ICD-10-CM | POA: Diagnosis not present

## 2021-01-15 DIAGNOSIS — E063 Autoimmune thyroiditis: Secondary | ICD-10-CM | POA: Diagnosis not present

## 2021-01-30 ENCOUNTER — Other Ambulatory Visit: Payer: Self-pay | Admitting: Cardiology

## 2021-02-05 DIAGNOSIS — R600 Localized edema: Secondary | ICD-10-CM | POA: Diagnosis not present

## 2021-02-05 DIAGNOSIS — Z6823 Body mass index (BMI) 23.0-23.9, adult: Secondary | ICD-10-CM | POA: Diagnosis not present

## 2021-02-05 DIAGNOSIS — I10 Essential (primary) hypertension: Secondary | ICD-10-CM | POA: Diagnosis not present

## 2021-02-25 NOTE — Progress Notes (Signed)
Cardiology Office Note:    Date:  02/26/2021   ID:  Emily Valentine, DOB 1950-08-04, MRN [id number]  PCP:  Sharilyn Sites, MD   [facility] Providers Cardiologist:  Jenkins Rouge, MD     Referring MD: Sharilyn Sites, MD   Chief Complaint: fatigue  History of Present Illness:    Emily Valentine is a 70 y.o. female with a hx of right BBB, HTN, frequent PVCs, COPD Gold 2, tobacco abuse, aortic atherosclerosis and CAD seen on CT.   She established care with our group in 2013 for abnormal EKG in the setting of presurgical clearance for bunion surgery.  She had a nuclear stress test which was low risk with no ischemia.  She was referred back to our office 2 years later by primary care for palpitations.  She was felt to have symptoms consistent with angina, and additional testing was ordered.  Her nuclear stress test showed no evidence of ischemia and LVEF 56%. Her echocardiogram showed an ejection fraction of 65 to 70% with vigorous contraction and no significant valve abnormalities  Her last office visit was 08/09/2020 with Cecilie Kicks, NP.  She was advised to follow-up in 8 to 9 months with Dr. Johnsie Cancel.  She called our office on 01/09/2021 with concerns about bilateral feet swelling. She did not have weights to report because she does not weigh herself daily.  States swelling was increasing through the day and going down overnight.  She had a recent echo at [facility] which revealed hyperdynamic left ventricular function with an EF of 70 to 75%, moderate left ventricular hypertrophy, trivial MR, mild to moderate TR. she was advised by Dr. Alfonse Spruce to follow-up with her primary care provider.   The appointment for today was scheduled for post hospital follow-up for admission 12/17/20 for generalized malaise and weakness.  She had a mildly elevated troponin of 58 ?54, flat, her losartan and hctz were held due to hypotension.  She was found to be hypokalemic likely in the setting of decreased oral  intake for several days prior to hospitalization.   Today, she is here with her husband. She reports that since being diagnosed with Covid-19 infection in September, she has struggled with feeling fatigued.  During her admission 12/17/2020 her blood pressure medicines were held in the setting of hypotension.  Since that time, she reports that she called our office and spoke with an on-call provider due to elevated blood pressure readings at home and was told she could resume losartan 100 mg daily and hydrochlorothiazide 25 mg daily. She denies chest pain, shortness of breath, palpitations, melena, hematuria, hemoptysis, diaphoresis, weakness, presyncope, syncope, orthopnea, and PND. Reports fatigue is consistent over past 2 months and that she is able to complete her daily activities at home, but feels that she continues to be more fatigued in general than she was prior to Covid infection. Has bilateral leg swelling that occurs throughout the day and improves overnight; states left leg swells more than right. She has not taken furosemide for swelling. She is not exercising on a regular basis. Admits she is not drinking but about 24 oz of water daily.    Past Medical History:  Diagnosis Date   Diverticulitis 08/2016   History of echocardiogram    Echo (8/15): Vigorous LV function, EF 65-70%, normal wall motion, grade 1 diastolic dysfunction, mild to moderate TR, PASP 31 mm Hg   Hx of cardiovascular stress test    ETT-Myoview (8/15): no ischemia, EF 56%, normal  study   Hypertension    Hypothyroid    Pernicious anemia    PVC's (premature ventricular contractions)    Rheumatoid arthritis (HCC)    Right bundle branch block    S/P colonoscopy 2001, 2004   2001: hyperplastic polyp, 2004: normal, inflammatory polyp    Past Surgical History:  Procedure Laterality Date   ABDOMINAL HYSTERECTOMY     BACK SURGERY     BREAST BIOPSY Right 10/29/2010   BREAST BIOPSY Right 11/27/2014   BREAST EXCISIONAL  BIOPSY Right 02/21/2015   BREAST LUMPECTOMY WITH RADIOACTIVE SEED LOCALIZATION Right 02/21/2015   Procedure: RIGHT BREAST LUMPECTOMY WITH RADIOACTIVE SEED LOCALIZATION;  Surgeon: Rolm Bookbinder, MD;  Location: [facility];  Service: General;  Laterality: Right;   COLONOSCOPY  11/10/2010   Procedure: COLONOSCOPY;  Surgeon: Daneil Dolin, MD;  [facility];  Service: Endoscopy;  Laterality: N/A;   COLONOSCOPY N/A 09/28/2016   Dr. Gala Romney; Diverticulosis with evidence of recent diverticulitis.  Next colonoscopy in 2023 given history of tubular adenomas in the past.   THYROIDECTOMY      Current Medications: Current Meds  Medication Sig   acetaminophen (TYLENOL) 650 MG CR tablet Take 650 mg by mouth 2 (two) times daily as needed for pain.   albuterol (VENTOLIN HFA) 108 (90 Base) MCG/ACT inhaler Inhale 1-2 puffs into the lungs every 6 (six) hours as needed for wheezing or shortness of breath.   Cyanocobalamin (VITAMIN B-12 IJ) Inject 1,000 mcg as directed every 30 (thirty) days.   hydrocortisone cream 1 % Apply 1 application topically daily as needed for itching.   levothyroxine (SYNTHROID, LEVOTHROID) 88 MCG tablet Take 88 mcg by mouth daily before breakfast.   methotrexate 2.5 MG tablet Take 2.5 mg by mouth once a week. Take 7 tabs once per week   metoprolol succinate (TOPROL-XL) 25 MG 24 hr tablet TAKE (1) TABLET BY MOUTH DAILY.   Multiple Vitamins-Minerals (MULTIVITAMIN WITH MINERALS) tablet Take 1 tablet by mouth daily at 12 noon.    nitroGLYCERIN (NITROSTAT) 0.4 MG SL tablet Place 1 tablet (0.4 mg total) under the tongue every 5 (five) minutes as needed for chest pain.   Plecanatide (TRULANCE) 3 MG TABS Take 3 mg by mouth daily.   polyethylene glycol (MIRALAX / GLYCOLAX) packet Take 17 g by mouth daily as needed for mild constipation.   traMADol (ULTRAM) 50 MG tablet Take 50 mg by mouth as needed for moderate pain.   umeclidinium-vilanterol (ANORO ELLIPTA) 62.5-25  MCG/INH AEPB Inhale 1 puff into the lungs daily.   Wheat Dextrin (BENEFIBER) POWD Take 1 Dose by mouth as needed (stool).     Allergies:   Codeine and Oxycontin [oxycodone]   Social History   Socioeconomic History   Marital status: Married    Spouse name: Not on file   Number of children: Not on file   Years of education: Not on file   Highest education level: Not on file  Occupational History   Not on file  Tobacco Use   Smoking status: Former    Years: 35.00    Types: Cigarettes   Smokeless tobacco: Former   Tobacco comments:    3-4 cigs per day 07/30/20//lmr  Vaping Use   Vaping Use: Never used  Substance and Sexual Activity   Alcohol use: No   Drug use: No   Sexual activity: Yes    Birth control/protection: Surgical  Other Topics Concern   Not on file  Social History Narrative  Not on file   Social Determinants of Health   Financial Resource Strain: Not on file  Food Insecurity: Not on file  Transportation Needs: Not on file  Physical Activity: Not on file  Stress: Not on file  Social Connections: Not on file     Family History: The patient's family history includes Anemia in her father, mother, and sister; Cancer in her brother, father, and mother; Colon cancer in her father; Diabetes in her sister; Hypertension in her brother, father, mother, and sister; Stomach cancer in her mother; Thyroid disease in her brother, mother, and sister.  ROS:   Please see the history of present illness.  All other systems reviewed and are negative.  Labs/Other Studies Reviewed:    The following studies were reviewed today:  Echo 12/17/20  Left Ventricle: Left ventricular ejection fraction, by estimation, is 70  to 75%. The left ventricle has hyperdynamic function. The left ventricle  has no regional wall motion abnormalities. The left ventricular internal  cavity size was small. There is moderate left ventricular hypertrophy. Left ventricular diastolic parameters were  normal.  Right Ventricle: The right ventricular size is moderately enlarged. No  increase in right ventricular wall thickness. Right ventricular systolic  function is mildly reduced. There is normal pulmonary artery systolic  pressure. The tricuspid regurgitant velocity is 2.64 m/s, and with an assumed right atrial pressure of 3 mmHg,the estimated right ventricular systolic pressure is 10.0 mmHg.  Left Atrium: Left atrial size was normal in size.  Right Atrium: Right atrial size was mild to moderately dilated.  Pericardium: There is no evidence of pericardial effusion.  Mitral Valve: The mitral valve is grossly normal. Mild mitral annular  calcification. Trivial mitral valve regurgitation. MV peak gradient, 3.1  mmHg. The mean mitral valve gradient is 1.0 mmHg.  Tricuspid Valve: The tricuspid valve is grossly normal. Tricuspid valve  regurgitation is mild to moderate.  Aortic Valve: The aortic valve is tricuspid. There is mild aortic valve  annular calcification. Aortic valve regurgitation is not visualized. No  aortic stenosis is present. Aortic valve mean gradient measures 4.0 mmHg. Aortic valve peak gradient measures 8.6 mmHg. Aortic valve area, by VTI measures 2.32 cm.  Pulmonic Valve: The pulmonic valve was grossly normal. Pulmonic valve regurgitation is trivial.  Aorta: The aortic root is normal in size and structure.  Venous: The inferior vena cava is normal in size with greater than 50%  respiratory variability, suggesting right atrial pressure of 3 mmHg.  IAS/Shunts: No atrial level shunt detected by color flow Doppler.   CT Chest Lung Cancer screening  IMPRESSION: 1. Lung-RADS 2, benign appearance or behavior. Continue annual screening with low-dose chest CT without contrast in 12 months. 2. Three-vessel coronary atherosclerosis. 3. Aortic Atherosclerosis (ICD10-I70.0) and Emphysema (ICD10-J43.9).    Myoview 8/15  No ischemia.    Recent Labs: 12/17/2020: ALT 26;  Magnesium 1.9; TSH 1.368 12/18/2020: BUN 17; Creatinine, Ser 1.02; Hemoglobin 10.3; Platelets 207; Potassium 3.9; Sodium 140  Recent Lipid Panel    Component Value Date/Time   CHOL 151 10/16/2013 1023   TRIG 116.0 10/16/2013 1023   HDL 37.50 (L) 10/16/2013 1023   CHOLHDL 4 10/16/2013 1023   VLDL 23.2 10/16/2013 1023   LDLCALC 90 10/16/2013 1023      Physical Exam:    VS:  BP 140/82    Pulse 74    Ht _0  (1.575 m)    Wt 131 lb 12.8 oz (59.8 kg)    SpO2 98%  BMI 24.11 kg/m     Wt Readings from Last 3 Encounters:  02/26/21 131 lb 12.8 oz (59.8 kg)  12/18/20 121 lb 11.1 oz (55.2 kg)  11/18/20 124 lb 5.4 oz (56.4 kg)     GEN:  Well nourished, well developed in no acute distress HEENT: Normal NECK: No JVD; No carotid bruits LYMPHATICS: No lymphadenopathy CARDIAC: RRR, no murmurs, rubs, gallops RESPIRATORY:  Decreased air movement bilateral lung bases. No rales, wheezing or rhonchi.  ABDOMEN: Soft, non-tender, non-distended MUSCULOSKELETAL:  No edema; No deformity  SKIN: Warm and dry NEUROLOGIC:  Alert and oriented x 3 PSYCHIATRIC:  Normal affect   EKG:  EKG is not ordered today.    Diagnoses:    1. Aortic atherosclerosis ([place])   2. Hypokalemia   3. Essential hypertension   4. RBBB    Assessment and Plan:     Essential hypertension: She has resumed losartan and hydrochlorothiazide. Reports home blood pressure readings range from the high 130s to 497 over 02O diastolic.  We discussed changing losartan to a different ARB however she feels that she may have difficulty adjusting to a new medication.  Advised her to monitor blood pressure at least 2 hours after morning medications and send in readings in 1 week.  Encouraged her to limit sodium intake to 2000 mg/day and to increase physical activity to 150 minutes/week.  Would consider addition of spironolactone also, in the setting of hypokalemia during her October hospitalization.Continue Toprol, losartan, hctz.   Aortic  atherosclerosis: Aortic atherosclerosis and three-vessel coronary atherosclerosis noted on chest CT 7/22.  Last LDL 76 May 2021.  She is not on statin.  We will recheck lipid panel today as she is fasting. Will advise her to start aspirin 81 mg in the setting of aortic atherosclerosis.   Hypokalemia: Serum potassium level was low during hospitalization in October 2022.  She was given supplementation during the hospitalization and level returned to within normal limits.  She has not taken any supplementation since that time.  Encouraged her to increase intake of potassium rich foods and we will recheck basic metabolic panel today to determine if additional supplementation is needed.  She is on hydrochlorothiazide 25 mg daily. Could also consider addition of spironolactone if blood pressure remains elevated.  Smoking cessation: She stopped smoking September 2022 in the setting of Covid infection. I congratulated her on this achievement.   Right bundle branch block: History of right bundle branch block.  EKG was not done today.  Echo 12/17/2020 reveals normal systolic pumping function.  Disposition: 6 months with Dr. Johnsie Cancel     Medication Adjustments/Labs and Tests Ordered: Current medicines are reviewed at length with the patient today.  Concerns regarding medicines are outlined above.  Orders Placed This Encounter  Procedures   Comp Met (CMET)   Lipid Profile   No orders of the defined types were placed in this encounter.   Patient Instructions  Medication Instructions:   Your physician recommends that you continue on your current medications as directed. Please refer to the Current Medication list given to you today.  *If you need a refill on your cardiac medications before your next appointment, please call your pharmacy*   Lab Work:  TODAY!!!! CMET/LIPID  If you have labs (blood work) drawn today and your tests are completely normal, you will receive your results only by: [place] (if you have MyChart) OR A paper copy in the mail If you have any lab test that is abnormal or  we need to change your treatment, we will call you to review the results.   Testing/Procedures:  -NONE   Follow-Up: At [facility], you and your health needs are our priority.  As part of our continuing mission to provide you with exceptional heart care, we have created designated Provider Care Teams.  These Care Teams include your primary Cardiologist (physician) and Advanced Practice Providers (APPs -  Physician Assistants and Nurse Practitioners) who all work together to provide you with the care you need, when you need it.  We recommend signing up for the patient portal called "MyChart".  Sign up information is provided on this After Visit Summary.  MyChart is used to connect with patients for Virtual Visits (Telemedicine).  Patients are able to view lab/test results, encounter notes, upcoming appointments, etc.  Non-urgent messages can be sent to your provider as well.   To learn more about what you can do with MyChart, go to [email or web address].    Your next appointment:   6 month(s)  The format for your next appointment:   In Person  Provider:   Jenkins Rouge, MD      Other Instructions  Please check blood pressure for one week and send readings either call @ [phone] or send through mychart.   Blood Pressure Record Sheet To take your blood pressure, you will need a blood pressure machine. You can buy a blood pressure machine (blood pressure monitor) at your clinic, drug store, or online. When choosing one, consider: An automatic monitor that has an arm cuff. A cuff that wraps snugly around your upper arm. You should be able to fit only one finger between your arm and the cuff. A device that stores blood pressure reading results. Do not choose a monitor that measures your blood pressure from your wrist or finger. Follow your health care provider's instructions  for how to take your blood pressure. To use this form: Get one reading in the morning (a.m.) before you take any medicines. Get one reading in the evening (p.m.) before supper. Take at least 2 readings with each blood pressure check. This makes sure the results are correct. Wait 1-2 minutes between measurements. Write down the results in the spaces on this form. Repeat this once a week, or as told by your health care provider. Make a follow-up appointment with your health care provider to discuss the results. Blood pressure log Date: _______________________ a.m. _____________________(1st reading) _____________________(2nd reading) p.m. _____________________(1st reading) _____________________(2nd reading) Date: _______________________ a.m. _____________________(1st reading) _____________________(2nd reading) p.m. _____________________(1st reading) _____________________(2nd reading) Date: _______________________ a.m. _____________________(1st reading) _____________________(2nd reading) p.m. _____________________(1st reading) _____________________(2nd reading) Date: _______________________ a.m. _____________________(1st reading) _____________________(2nd reading) p.m. _____________________(1st reading) _____________________(2nd reading) Date: _______________________ a.m. _____________________(1st reading) _____________________(2nd reading) p.m. _____________________(1st reading) _____________________(2nd reading) This information is not intended to replace advice given to you by your health care provider. Make sure you discuss any questions you have with your health care provider. Document Revised: 06/20/2019 Document Reviewed: 06/21/2019 Elsevier Patient Education  2022 [place], Emmaline Life, NP  02/26/2021 8:36 AM    [place]

## 2021-02-26 ENCOUNTER — Other Ambulatory Visit: Payer: Self-pay

## 2021-02-26 ENCOUNTER — Encounter: Payer: Self-pay | Admitting: Nurse Practitioner

## 2021-02-26 ENCOUNTER — Ambulatory Visit: Payer: Medicare PPO | Admitting: Nurse Practitioner

## 2021-02-26 VITALS — BP 140/82 | HR 74 | Ht 62.0 in | Wt 131.8 lb

## 2021-02-26 DIAGNOSIS — I10 Essential (primary) hypertension: Secondary | ICD-10-CM

## 2021-02-26 DIAGNOSIS — I700 Atherosclerosis of aorta: Secondary | ICD-10-CM

## 2021-02-26 DIAGNOSIS — I4510 Unspecified right bundle-branch block: Secondary | ICD-10-CM

## 2021-02-26 DIAGNOSIS — E876 Hypokalemia: Secondary | ICD-10-CM | POA: Diagnosis not present

## 2021-02-26 LAB — COMPREHENSIVE METABOLIC PANEL
ALT: 17 IU/L (ref 0–32)
AST: 34 IU/L (ref 0–40)
Albumin/Globulin Ratio: 1.2 (ref 1.2–2.2)
Albumin: 3.8 g/dL (ref 3.7–4.7)
Alkaline Phosphatase: 86 IU/L (ref 44–121)
BUN/Creatinine Ratio: 17 (ref 12–28)
BUN: 19 mg/dL (ref 8–27)
Bilirubin Total: 0.6 mg/dL (ref 0.0–1.2)
CO2: 28 mmol/L (ref 20–29)
Calcium: 9.5 mg/dL (ref 8.7–10.3)
Chloride: 103 mmol/L (ref 96–106)
Creatinine, Ser: 1.1 mg/dL — ABNORMAL HIGH (ref 0.57–1.00)
Globulin, Total: 3.3 g/dL (ref 1.5–4.5)
Glucose: 80 mg/dL (ref 70–99)
Potassium: 4 mmol/L (ref 3.5–5.2)
Sodium: 143 mmol/L (ref 134–144)
Total Protein: 7.1 g/dL (ref 6.0–8.5)
eGFR: 54 mL/min/{1.73_m2} — ABNORMAL LOW (ref >59)

## 2021-02-26 LAB — LIPID PANEL
Chol/HDL Ratio: 2.6 ratio (ref 0.0–4.4)
Cholesterol, Total: 169 mg/dL (ref 100–199)
HDL: 66 mg/dL (ref >39)
LDL Chol Calc (NIH): 83 mg/dL (ref 0–99)
Triglycerides: 113 mg/dL (ref 0–149)
VLDL Cholesterol Cal: 20 mg/dL (ref 5–40)

## 2021-02-26 NOTE — Patient Instructions (Signed)
Medication Instructions:   Your physician recommends that you continue on your current medications as directed. Please refer to the Current Medication list given to you today.  *If you need a refill on your cardiac medications before your next appointment, please call your pharmacy*   Lab Work:  TODAY!!!! CMET/LIPID  If you have labs (blood work) drawn today and your tests are completely normal, you will receive your results only by: [place] (if you have MyChart) OR A paper copy in the mail If you have any lab test that is abnormal or we need to change your treatment, we will call you to review the results.   Testing/Procedures:  -NONE   Follow-Up: At [facility], you and your health needs are our priority.  As part of our continuing mission to provide you with exceptional heart care, we have created designated Provider Care Teams.  These Care Teams include your primary Cardiologist (physician) and Advanced Practice Providers (APPs -  Physician Assistants and Nurse Practitioners) who all work together to provide you with the care you need, when you need it.  We recommend signing up for the patient portal called "MyChart".  Sign up information is provided on this After Visit Summary.  MyChart is used to connect with patients for Virtual Visits (Telemedicine).  Patients are able to view lab/test results, encounter notes, upcoming appointments, etc.  Non-urgent messages can be sent to your provider as well.   To learn more about what you can do with MyChart, go to [email or web address].    Your next appointment:   6 month(s)  The format for your next appointment:   In Person  Provider:   Jenkins Rouge, MD      Other Instructions  Please check blood pressure for one week and send readings either call @ [phone] or send through mychart.   Blood Pressure Record Sheet To take your blood pressure, you will need a blood pressure machine. You can buy a blood pressure  machine (blood pressure monitor) at your clinic, drug store, or online. When choosing one, consider: An automatic monitor that has an arm cuff. A cuff that wraps snugly around your upper arm. You should be able to fit only one finger between your arm and the cuff. A device that stores blood pressure reading results. Do not choose a monitor that measures your blood pressure from your wrist or finger. Follow your health care provider's instructions for how to take your blood pressure. To use this form: Get one reading in the morning (a.m.) before you take any medicines. Get one reading in the evening (p.m.) before supper. Take at least 2 readings with each blood pressure check. This makes sure the results are correct. Wait 1-2 minutes between measurements. Write down the results in the spaces on this form. Repeat this once a week, or as told by your health care provider. Make a follow-up appointment with your health care provider to discuss the results. Blood pressure log Date: _______________________ a.m. _____________________(1st reading) _____________________(2nd reading) p.m. _____________________(1st reading) _____________________(2nd reading) Date: _______________________ a.m. _____________________(1st reading) _____________________(2nd reading) p.m. _____________________(1st reading) _____________________(2nd reading) Date: _______________________ a.m. _____________________(1st reading) _____________________(2nd reading) p.m. _____________________(1st reading) _____________________(2nd reading) Date: _______________________ a.m. _____________________(1st reading) _____________________(2nd reading) p.m. _____________________(1st reading) _____________________(2nd reading) Date: _______________________ a.m. _____________________(1st reading) _____________________(2nd reading) p.m. _____________________(1st reading) _____________________(2nd reading) This information is not intended to  replace advice given to you by your health care provider. Make sure you discuss any questions you have with your health care provider. Document  Revised: 06/20/2019 Document Reviewed: 06/21/2019 Elsevier Patient Education  [place].

## 2021-03-11 ENCOUNTER — Telehealth: Payer: Self-pay

## 2021-03-11 NOTE — Telephone Encounter (Signed)
PA for Trulance 3 mg tablets has been renewed and approved through 03/15/2022. Approval letter to be scanned into patient's chart.

## 2021-03-13 ENCOUNTER — Other Ambulatory Visit: Payer: Self-pay | Admitting: Family Medicine

## 2021-03-13 DIAGNOSIS — Z1231 Encounter for screening mammogram for malignant neoplasm of breast: Secondary | ICD-10-CM

## 2021-03-14 ENCOUNTER — Ambulatory Visit
Admission: RE | Admit: 2021-03-14 | Discharge: 2021-03-14 | Disposition: A | Discharge status: Home / Self Care | Payer: Medicare PPO | Source: Ambulatory Visit | Attending: Family Medicine | Admitting: Family Medicine

## 2021-03-14 ENCOUNTER — Other Ambulatory Visit: Payer: Self-pay

## 2021-03-14 DIAGNOSIS — Z1231 Encounter for screening mammogram for malignant neoplasm of breast: Secondary | ICD-10-CM

## 2021-03-25 DIAGNOSIS — M1990 Unspecified osteoarthritis, unspecified site: Secondary | ICD-10-CM | POA: Diagnosis not present

## 2021-03-25 DIAGNOSIS — Z79899 Other long term (current) drug therapy: Secondary | ICD-10-CM | POA: Diagnosis not present

## 2021-03-25 DIAGNOSIS — M8580 Other specified disorders of bone density and structure, unspecified site: Secondary | ICD-10-CM | POA: Diagnosis not present

## 2021-03-25 DIAGNOSIS — M549 Dorsalgia, unspecified: Secondary | ICD-10-CM | POA: Diagnosis not present

## 2021-03-25 DIAGNOSIS — M059 Rheumatoid arthritis with rheumatoid factor, unspecified: Secondary | ICD-10-CM | POA: Diagnosis not present

## 2021-03-25 DIAGNOSIS — J449 Chronic obstructive pulmonary disease, unspecified: Secondary | ICD-10-CM | POA: Diagnosis not present

## 2021-03-29 NOTE — Progress Notes (Signed)
Referring Provider: Sharilyn Sites, MD Primary Care Physician:  Sharilyn Sites, MD Primary GI Physician: Dr. Gala Romney  Chief Complaint  Patient presents with   Constipation    Takes trulance 3 times a week and will have a BM 1-2 times per week, takes benefiber with trulance 3 times per week    HPI:   Emily Valentine is a 71 y.o. female presenting today for follow-up of constipation.  History of constipation, diverticulitis and colonic polyps.  Last colonoscopy July 2018 with diverticulosis with evidence of recent diverticulitis, recommended repeat in 2023 due to history of tubular adenomas in the past.  Last seen in our office 11/06/2020.  Constipation not adequately managed with 1-2 BMs a week that were incomplete.  Associated lower abdominal pain that improved with a bowel movement.  No alarm symptoms.  MiraLAX not helping enough.  She was taking Benefiber daily. Previously failed Linzess 290 mcg daily. Plan to start Trulance, requested progress report in 2-4 weeks, f/u in 4 months.   Today:  Taking Trulance and Benefiber 3 times a week with 1-2 BMs per week.  She will get the urge to have a bowel movement more frequently than this, but only ends up with a little staining in her undergarments.  States she has been playing around with the dosage of Trulance, taking twice a week, 3 times a week, in the morning, or in the evening.  She has never taken Trulance every day.  Admits to occasional abdominal discomfort related to constipation that improves when she has a BM. No brbpr or melena.  No unintentional weight loss.   Tramadol is rare. 3-5 times a month.   No upper GI issues.  Denies nausea, vomiting, reflux symptoms, or dysphagia.  Past Medical History:  Diagnosis Date   Chronic constipation    COVID-19 11/2020   Diverticulitis 08/2016   History of echocardiogram    Echo (8/15): Vigorous LV function, EF 65-70%, normal wall motion, grade 1 diastolic dysfunction, mild to moderate TR,  PASP 31 mm Hg   Hx of cardiovascular stress test    ETT-Myoview (8/15): no ischemia, EF 56%, normal study   Hypertension    Hypothyroid    Pernicious anemia    PVC's (premature ventricular contractions)    Rheumatoid arthritis ([place])    Right bundle branch block    S/P colonoscopy 2001, 2004   2001: hyperplastic polyp, 2004: normal, inflammatory polyp    Past Surgical History:  Procedure Laterality Date   ABDOMINAL HYSTERECTOMY     BACK SURGERY     BREAST BIOPSY Right 10/29/2010   BREAST BIOPSY Right 11/27/2014   BREAST EXCISIONAL BIOPSY Right 02/21/2015   BREAST LUMPECTOMY WITH RADIOACTIVE SEED LOCALIZATION Right 02/21/2015   Procedure: RIGHT BREAST LUMPECTOMY WITH RADIOACTIVE SEED LOCALIZATION;  Surgeon: Rolm Bookbinder, MD;  Location: [place];  Service: General;  Laterality: Right;   COLONOSCOPY  11/10/2010   Procedure: COLONOSCOPY;  Surgeon: Daneil Dolin, MD;  Location[facility];  Service: Endoscopy;  Laterality: N/A;   COLONOSCOPY N/A 09/28/2016   Dr. Gala Romney; Diverticulosis with evidence of recent diverticulitis.  Next colonoscopy in 2023 given history of tubular adenomas in the past.   THYROIDECTOMY      Current Outpatient Medications  Medication Sig Dispense Refill   acetaminophen (TYLENOL) 650 MG CR tablet Take 650 mg by mouth 2 (two) times daily as needed for pain.     albuterol (VENTOLIN HFA) 108 (90 Base) MCG/ACT inhaler Inhale 1-2  puffs into the lungs every 6 (six) hours as needed for wheezing or shortness of breath.     aspirin EC 81 MG tablet Take 81 mg by mouth daily. Swallow whole.     Cyanocobalamin (VITAMIN B-12 IJ) Inject 1,000 mcg as directed every 30 (thirty) days.     hydrocortisone cream 1 % Apply 1 application topically daily as needed for itching.     levothyroxine (SYNTHROID, LEVOTHROID) 88 MCG tablet Take 88 mcg by mouth daily before breakfast.     losartan-hydrochlorothiazide (HYZAAR) 100-25 MG tablet daily.     methotrexate  2.5 MG tablet Take 4 tabs once per week     metoprolol succinate (TOPROL-XL) 25 MG 24 hr tablet TAKE (1) TABLET BY MOUTH DAILY. 90 tablet 3   nitroGLYCERIN (NITROSTAT) 0.4 MG SL tablet Place 1 tablet (0.4 mg total) under the tongue every 5 (five) minutes as needed for chest pain. 25 tablet 3   Plecanatide (TRULANCE) 3 MG TABS Take 3 mg by mouth daily. (Patient taking differently: Take 3 mg by mouth. 3 times per week) 30 tablet 3   traMADol (ULTRAM) 50 MG tablet Take 50 mg by mouth as needed for moderate pain.     Wheat Dextrin (BENEFIBER) POWD Take 1 Dose by mouth. 3 times per week     No current facility-administered medications for this visit.    Allergies as of 03/31/2021 - Review Complete 03/31/2021  Allergen Reaction Noted   Codeine Nausea Only 08/24/2016   Oxycontin [oxycodone] Nausea And Vomiting 09/01/2011    Family History  Problem Relation Age of Onset   Stomach cancer Mother        deceased   Anemia Mother    Cancer Mother    Hypertension Mother    Thyroid disease Mother    Colon cancer Father        diagnosed age 62, died at age 4   Anemia Father    Cancer Father    Hypertension Father    Anemia Sister    Cancer Brother    Diabetes Sister    Hypertension Sister    Hypertension Brother    Thyroid disease Brother    Thyroid disease Sister     Social History   Socioeconomic History   Marital status: Married    Spouse name: Not on file   Number of children: Not on file   Years of education: Not on file   Highest education level: Not on file  Occupational History   Not on file  Tobacco Use   Smoking status: Former    Years: 35.00    Types: Cigarettes   Smokeless tobacco: Former   Tobacco comments:    3-4 cigs per day 07/30/20//lmr  Vaping Use   Vaping Use: Never used  Substance and Sexual Activity   Alcohol use: No   Drug use: No   Sexual activity: Yes    Birth control/protection: Surgical  Other Topics Concern   Not on file  Social History  Narrative   Not on file   Social Determinants of Health   Financial Resource Strain: Not on file  Food Insecurity: Not on file  Transportation Needs: Not on file  Physical Activity: Not on file  Stress: Not on file  Social Connections: Not on file    Review of Systems: Gen: Denies fever, chills, cold or flulike symptoms, presyncope, syncope.  She does note brain fog since having COVID in September 2022. CV: Denies chest pain, palpitations. Resp: Denies dyspnea  or cough. GI: See HPI Heme: See HPI  Physical Exam: BP 139/86    Pulse 79    Temp (!) 96.8 F (36 C)    Ht 5\' 2"  (1.575 m)    Wt 132 lb 9.6 oz (60.1 kg)    BMI 24.25 kg/m  General:   Alert and oriented. No distress noted. Pleasant and cooperative.  Head:  Normocephalic and atraumatic. Eyes:  Conjuctiva clear without scleral icterus. Heart:  S1, S2 present without murmurs appreciated. Lungs:  Clear to auscultation bilaterally. No wheezes, rales, or rhonchi. No distress.  Abdomen:  +BS, soft, non-tender and non-distended. No rebound or guarding. No HSM or masses noted. Msk:  Symmetrical without gross deformities. Normal posture. Extremities:  Without edema. Neurologic:  Alert and  oriented x4 Psych:  Normal mood and affect.    Assessment:  71 year old female presenting today for follow-up of chronic constipation, likely with component if IBS.  She does take tramadol, but this is sparingly and less likely to be the etiology of her chronic constipation.  Currently, symptoms or not adequately managed on Trulance and Benefiber though she is only taking these medications 3 times per week which is allowing her to have 1-2 bowel movements per week.  Previously failed Linzess 290 mcg daily.  Has never tried Amitiza.  She has no alarm symptoms.  Her colonoscopy is up-to-date, due for surveillance in July this year.  Plan: Take Trulance 3 mg every day at the same time with or without food. Take Benefiber 2 teaspoons daily x2 weeks  then increase to twice daily. Advised to let me know if constipation symptoms do not improve in 4-6 weeks and we can try Amitiza. Follow-up in about 4 months.  We will discuss scheduling colonoscopy at that time as well.   Aliene Altes, PA-C [facility] 03/31/2021

## 2021-03-31 ENCOUNTER — Encounter: Payer: Self-pay | Admitting: Gastroenterology

## 2021-03-31 ENCOUNTER — Other Ambulatory Visit: Payer: Self-pay

## 2021-03-31 ENCOUNTER — Ambulatory Visit: Payer: Medicare PPO | Admitting: Gastroenterology

## 2021-03-31 VITALS — BP 139/86 | HR 79 | Temp 96.8°F | Ht 62.0 in | Wt 132.6 lb

## 2021-03-31 DIAGNOSIS — K5900 Constipation, unspecified: Secondary | ICD-10-CM

## 2021-03-31 DIAGNOSIS — Z86010 Personal history of colonic polyps: Secondary | ICD-10-CM

## 2021-03-31 NOTE — Patient Instructions (Signed)
Take Trulance 3 mg every day at the same time with or without food.  Take Benefiber 2 teaspoons every day x2 weeks then increase to twice daily.  Monitor your constipation symptoms with the above recommendations for the next 4-6 weeks.  If you do not see any improvement in your symptoms, please let me know and we can try changing your medications.  We will plan to see you back in about 4 months.  Do not hesitate to call if you have any questions or concerns prior to your next visit.  It was good to see you again today!  Aliene Altes, PA-C [facility]

## 2021-05-08 DIAGNOSIS — Z6823 Body mass index (BMI) 23.0-23.9, adult: Secondary | ICD-10-CM | POA: Diagnosis not present

## 2021-05-08 DIAGNOSIS — I10 Essential (primary) hypertension: Secondary | ICD-10-CM | POA: Diagnosis not present

## 2021-05-08 DIAGNOSIS — J449 Chronic obstructive pulmonary disease, unspecified: Secondary | ICD-10-CM | POA: Diagnosis not present

## 2021-05-08 DIAGNOSIS — M069 Rheumatoid arthritis, unspecified: Secondary | ICD-10-CM | POA: Diagnosis not present

## 2021-05-08 DIAGNOSIS — J439 Emphysema, unspecified: Secondary | ICD-10-CM | POA: Diagnosis not present

## 2021-05-26 ENCOUNTER — Emergency Department (HOSPITAL_COMMUNITY): Payer: Medicare PPO

## 2021-05-26 ENCOUNTER — Other Ambulatory Visit: Payer: Self-pay

## 2021-05-26 ENCOUNTER — Encounter (HOSPITAL_COMMUNITY): Payer: Self-pay

## 2021-05-26 ENCOUNTER — Emergency Department (HOSPITAL_COMMUNITY)
Admission: EM | Admit: 2021-05-26 | Discharge: 2021-05-26 | Disposition: A | Discharge status: Home / Self Care | Payer: Medicare PPO | Attending: Emergency Medicine | Admitting: Emergency Medicine

## 2021-05-26 DIAGNOSIS — R531 Weakness: Secondary | ICD-10-CM | POA: Diagnosis present

## 2021-05-26 DIAGNOSIS — I10 Essential (primary) hypertension: Secondary | ICD-10-CM | POA: Insufficient documentation

## 2021-05-26 DIAGNOSIS — Z7982 Long term (current) use of aspirin: Secondary | ICD-10-CM | POA: Insufficient documentation

## 2021-05-26 DIAGNOSIS — R000 Tachycardia, unspecified: Secondary | ICD-10-CM | POA: Insufficient documentation

## 2021-05-26 DIAGNOSIS — R7989 Other specified abnormal findings of blood chemistry: Secondary | ICD-10-CM | POA: Insufficient documentation

## 2021-05-26 DIAGNOSIS — E039 Hypothyroidism, unspecified: Secondary | ICD-10-CM | POA: Diagnosis not present

## 2021-05-26 DIAGNOSIS — R059 Cough, unspecified: Secondary | ICD-10-CM | POA: Diagnosis not present

## 2021-05-26 DIAGNOSIS — R0602 Shortness of breath: Secondary | ICD-10-CM | POA: Insufficient documentation

## 2021-05-26 DIAGNOSIS — Z20822 Contact with and (suspected) exposure to covid-19: Secondary | ICD-10-CM | POA: Diagnosis not present

## 2021-05-26 DIAGNOSIS — I517 Cardiomegaly: Secondary | ICD-10-CM | POA: Diagnosis not present

## 2021-05-26 DIAGNOSIS — J449 Chronic obstructive pulmonary disease, unspecified: Secondary | ICD-10-CM | POA: Diagnosis not present

## 2021-05-26 DIAGNOSIS — N3001 Acute cystitis with hematuria: Secondary | ICD-10-CM | POA: Insufficient documentation

## 2021-05-26 LAB — COMPREHENSIVE METABOLIC PANEL
ALT: 39 U/L (ref 0–44)
AST: 45 U/L — ABNORMAL HIGH (ref 15–41)
Albumin: 3.2 g/dL — ABNORMAL LOW (ref 3.5–5.0)
Alkaline Phosphatase: 88 U/L (ref 38–126)
Anion gap: 11 (ref 5–15)
BUN: 24 mg/dL — ABNORMAL HIGH (ref 8–23)
CO2: 26 mmol/L (ref 22–32)
Calcium: 9.1 mg/dL (ref 8.9–10.3)
Chloride: 96 mmol/L — ABNORMAL LOW (ref 98–111)
Creatinine, Ser: 1.52 mg/dL — ABNORMAL HIGH (ref 0.44–1.00)
GFR, Estimated: 36 mL/min — ABNORMAL LOW (ref >60)
Glucose, Bld: 156 mg/dL — ABNORMAL HIGH (ref 70–99)
Potassium: 3.7 mmol/L (ref 3.5–5.1)
Sodium: 133 mmol/L — ABNORMAL LOW (ref 135–145)
Total Bilirubin: 1.6 mg/dL — ABNORMAL HIGH (ref 0.3–1.2)
Total Protein: 8.2 g/dL — ABNORMAL HIGH (ref 6.5–8.1)

## 2021-05-26 LAB — CBC WITH DIFFERENTIAL/PLATELET
Abs Immature Granulocytes: 0.05 10*3/uL (ref 0.00–0.07)
Basophils Absolute: 0 10*3/uL (ref 0.0–0.1)
Basophils Relative: 0 %
Eosinophils Absolute: 0.1 10*3/uL (ref 0.0–0.5)
Eosinophils Relative: 1 %
HCT: 40 % (ref 36.0–46.0)
Hemoglobin: 13.4 g/dL (ref 12.0–15.0)
Immature Granulocytes: 1 %
Lymphocytes Relative: 16 %
Lymphs Abs: 1.6 10*3/uL (ref 0.7–4.0)
MCH: 31.2 pg (ref 26.0–34.0)
MCHC: 33.5 g/dL (ref 30.0–36.0)
MCV: 93 fL (ref 80.0–100.0)
Monocytes Absolute: 1.8 10*3/uL — ABNORMAL HIGH (ref 0.1–1.0)
Monocytes Relative: 18 %
Neutro Abs: 6.4 10*3/uL (ref 1.7–7.7)
Neutrophils Relative %: 64 %
Platelets: 316 10*3/uL (ref 150–400)
RBC: 4.3 MIL/uL (ref 3.87–5.11)
RDW: 13.8 % (ref 11.5–15.5)
WBC: 10 10*3/uL (ref 4.0–10.5)
nRBC: 0 % (ref 0.0–0.2)

## 2021-05-26 LAB — URINALYSIS, ROUTINE W REFLEX MICROSCOPIC
Bilirubin Urine: NEGATIVE
Glucose, UA: NEGATIVE mg/dL
Ketones, ur: NEGATIVE mg/dL
Nitrite: NEGATIVE
Protein, ur: 30 mg/dL — AB
Specific Gravity, Urine: 1.006 (ref 1.005–1.030)
WBC, UA: >50 WBC/hpf — ABNORMAL HIGH (ref 0–5)
pH: 6 (ref 5.0–8.0)

## 2021-05-26 LAB — RESP PANEL BY RT-PCR (FLU A&B, COVID) ARPGX2
Influenza A by PCR: NEGATIVE
Influenza B by PCR: NEGATIVE
SARS Coronavirus 2 by RT PCR: NEGATIVE

## 2021-05-26 LAB — TROPONIN I (HIGH SENSITIVITY)
Troponin I (High Sensitivity): 33 ng/L — ABNORMAL HIGH (ref <18)
Troponin I (High Sensitivity): 33 ng/L — ABNORMAL HIGH (ref <18)

## 2021-05-26 MED ORDER — SODIUM CHLORIDE 0.9 % IV BOLUS
1000.0000 mL | Freq: Once | INTRAVENOUS | Status: AC
  Administered 2021-05-26: 1000 mL via INTRAVENOUS

## 2021-05-26 MED ORDER — CEPHALEXIN 500 MG PO CAPS: 500.0000 mg | ORAL_CAPSULE | Freq: Two times a day (BID) | ORAL | 0 refills | Status: AC

## 2021-05-26 NOTE — ED Provider Notes (Cosign Needed)
[place] Provider Note   CSN: [id number] Arrival date & time: 05/26/21  1128     History  Chief Complaint  Patient presents with   Weakness    Emily Valentine is a 71 y.o. female.  With past medical history of COPD, hypertension who presents to the emergency department with weakness.  Patient states that she feels "lethargic and tired."  She states that this has been ongoing for about 2 to 3 weeks.  She endorses intermittent shortness of breath, cough, decreased appetite, nausea.  She denies fever, vomiting, diarrhea, chest pain, palpitations, urinary symptoms.  She has difficulty elaborating on any further symptoms.   Weakness Associated symptoms: cough, nausea and shortness of breath   Associated symptoms: no abdominal pain, no chest pain, no diarrhea, no dysuria, no fever, no frequency, no urgency and no vomiting       Home Medications Prior to Admission medications   Medication Sig Start Date End Date Taking? Authorizing Provider  acetaminophen (TYLENOL) 650 MG CR tablet Take 650 mg by mouth 2 (two) times daily as needed for pain.    [provider]  albuterol (VENTOLIN HFA) 108 (90 Base) MCG/ACT inhaler Inhale 1-2 puffs into the lungs every 6 (six) hours as needed for wheezing or shortness of breath.    [provider]  aspirin EC 81 MG tablet Take 81 mg by mouth daily. Swallow whole.    [provider]  Cyanocobalamin (VITAMIN B-12 IJ) Inject 1,000 mcg as directed every 30 (thirty) days.    [provider]  hydrocortisone cream 1 % Apply 1 application topically daily as needed for itching.    [provider]  levothyroxine (SYNTHROID, LEVOTHROID) 88 MCG tablet Take 88 mcg by mouth daily before breakfast.    [provider]  losartan-hydrochlorothiazide (HYZAAR) 100-25 MG tablet daily.    [provider]  methotrexate 2.5 MG tablet Take 4 tabs once per week 06/09/18   [provider]  metoprolol succinate (TOPROL-XL) 25 MG 24 hr tablet TAKE (1) TABLET BY MOUTH DAILY. 08/09/20   Isaiah Serge, NP  nitroGLYCERIN (NITROSTAT) 0.4 MG SL tablet Place 1 tablet (0.4 mg total) under the tongue every 5 (five) minutes as needed for chest pain. 08/09/20   Isaiah Serge, NP  Plecanatide (TRULANCE) 3 MG TABS Take 3 mg by mouth daily. Patient taking differently: Take 3 mg by mouth. 3 times per week 11/06/20   Erenest Rasher, PA-C  traMADol (ULTRAM) 50 MG tablet Take 50 mg by mouth as needed for moderate pain. 04/11/18   [provider]  Wheat Dextrin (BENEFIBER) POWD Take 1 Dose by mouth. 3 times per week    [provider]      Allergies    Codeine and Oxycontin [oxycodone]    Review of Systems   Review of Systems  Constitutional:  Positive for appetite change and fatigue. Negative for fever.  HENT:  Negative for congestion, rhinorrhea and sore throat.   Respiratory:  Positive for cough and shortness of breath.   Cardiovascular:  Negative for chest pain and palpitations.  Gastrointestinal:  Positive for nausea. Negative for abdominal pain, diarrhea and vomiting.  Genitourinary:  Negative for dysuria, frequency and urgency.  Neurological:  Positive for weakness.  All other systems reviewed and are negative.  Physical Exam Updated Vital Signs BP 102/78    Pulse (!) 119    Temp 98.6 F (37 C) (Oral)    Resp 18  Ht '5\' 2"'$  (1.575 m)    Wt 61.2 kg    SpO2 93%    BMI 24.69 kg/m  Physical Exam Vitals and nursing note reviewed.  Constitutional:      General: She is not in acute distress.    Appearance: Normal appearance. She is ill-appearing. She is not toxic-appearing or diaphoretic.     Comments: Tired appearing  HENT:     Head: Normocephalic and atraumatic.     Nose: Nose normal.     Mouth/Throat:     Mouth: Mucous membranes are moist.     Pharynx: Oropharynx is clear. No posterior oropharyngeal erythema.  Eyes:     General: No scleral icterus.     Extraocular Movements: Extraocular movements intact.     Conjunctiva/sclera: Conjunctivae normal.  Cardiovascular:     Rate and Rhythm: Regular rhythm. Tachycardia present.     Pulses: Normal pulses.     Heart sounds: Normal heart sounds. No murmur heard. Pulmonary:     Effort: Tachypnea present.     Breath sounds: Examination of the right-lower field reveals rales. Examination of the left-lower field reveals rales. Rales present.  Abdominal:     General: Bowel sounds are normal. There is no distension.     Palpations: Abdomen is soft.     Tenderness: There is no abdominal tenderness.  Musculoskeletal:        General: Normal range of motion.     Cervical back: Neck supple.  Skin:    General: Skin is warm and dry.     Capillary Refill: Capillary refill takes less than 2 seconds.  Neurological:     General: No focal deficit present.     Mental Status: She is alert and oriented to person, place, and time. Mental status is at baseline.  Psychiatric:        Mood and Affect: Mood normal.        Behavior: Behavior normal.        Thought Content: Thought content normal.        Judgment: Judgment normal.    ED Results / Procedures / Treatments   Labs (all labs ordered are listed, but only abnormal results are displayed) Labs Reviewed  CBC WITH DIFFERENTIAL/PLATELET - Abnormal; Notable for the following components:      Result Value   Monocytes Absolute 1.8 (*)    All other components within normal limits  COMPREHENSIVE METABOLIC PANEL - Abnormal; Notable for the following components:   Sodium 133 (*)    Chloride 96 (*)    Glucose, Bld 156 (*)    BUN 24 (*)    Creatinine, Ser 1.52 (*)    Total Protein 8.2 (*)    Albumin 3.2 (*)    AST 45 (*)    Total Bilirubin 1.6 (*)    GFR, Estimated 36 (*)    All other components within normal limits  URINALYSIS, ROUTINE W REFLEX MICROSCOPIC - Abnormal; Notable for the following components:   APPearance HAZY (*)    Hgb urine dipstick LARGE  (*)    Protein, ur 30 (*)    Leukocytes,Ua LARGE (*)    WBC, UA >50 (*)    Bacteria, UA RARE (*)    All other components within normal limits  TROPONIN I (HIGH SENSITIVITY) - Abnormal; Notable for the following components:   Troponin I (High Sensitivity) 33 (*)    All other components within normal limits  TROPONIN I (HIGH SENSITIVITY) - Abnormal; Notable for the following components:  Troponin I (High Sensitivity) 33 (*)    All other components within normal limits  RESP PANEL BY RT-PCR (FLU A&B, COVID) [id number]  URINE CULTURE   EKG EKG Interpretation  Date/Time:  Monday May 26 2021 13:45:05 EDT Ventricular Rate:  109 PR Interval:  144 QRS Duration: 150 QT Interval:  374 QTC Calculation: 503 R Axis:   -65 Text Interpretation: Sinus tachycardia Right atrial enlargement Left axis deviation Right bundle branch block Septal infarct , age undetermined Abnormal ECG When compared with ECG of 17-Dec-2020 11:02, No significant change since last tracing Confirmed by Aletta Edouard [phone]) on 05/26/2021 5:24:36 PM  Radiology DG Chest 2 View  Result Date: 05/26/2021 CLINICAL DATA:  Cough.  Long COVID. EXAM: CHEST - 2 VIEW COMPARISON:  12/17/2020 FINDINGS: Midline trachea. Mild cardiomegaly. Atherosclerosis in the transverse aorta. No pleural effusion or pneumothorax. Clear lungs. IMPRESSION: Cardiomegaly without congestive failure. Aortic Atherosclerosis (ICD10-I70.0). Electronically Signed   By: Abigail Miyamoto M.D.   On: 05/26/2021 14:31    Procedures Procedures   Medications Ordered in ED Medications  sodium chloride 0.9 % bolus 1,000 mL (0 mLs Intravenous Stopped 05/26/21 1540)    ED Course/ Medical Decision Making/ A&P                           Medical Decision Making Amount and/or Complexity of Data Reviewed Labs: ordered. Radiology: ordered. ECG/medicine tests: ordered.  Risk Prescription drug management.  This patient presents to the ED for concern of lethargy, this  involves an extensive number of treatment options, and is a complaint that carries with it a high risk of complications and morbidity.  The differential diagnosis includes infection, failure to thrive, dehydration, intracranial event  Co morbidities that complicate the patient evaluation HTN, hypothyroid, anemia  Additional history obtained:  Additional history obtained from: husband at bedside External records from outside source obtained and reviewed including: previous ED visits  EKG: EKG: unchanged from previous tracings, sinus tachycardia, RBBB.   Cardiac Monitoring: The patient was maintained on a cardiac monitor.  I personally viewed and interpreted the cardiac monitored which showed an underlying rhythm of: sinus tachycardia  Lab Results: I personally ordered, reviewed, and interpreted labs. Pertinent results include: CMP with Creatinine 1.52, elevated from previous, T/bili mild elevation to 1.6 CBC without leukocytosis COVID and flu negative  UA + UTI Troponin x2 33, delta 0 negative Urine culture pending  Imaging Studies ordered:  I ordered imaging studies which included x-ray.  I independently reviewed & interpreted imaging & am in agreement with radiology impression. Imaging shows: CXR with cardiomegaly without congestion   Medications  I ordered medication including IVF for dehydration Reevaluation of the patient after medication shows that patient improved  ED Course: 71 year old female who presents to the emergency department with lethargy.  CBC without anemia, CMP without electrolyte derangement.  COVID and Flu negative EKG without ischemia or infarction, troponin x2 negative, doubt ACS CXR without pneumonia  UA with evidence of UTI. She does have slightly elevated creatinine compared to previous.  Think lethargy, nausea likely 2/2 UTI. Sent for culture.   On reassessment, she feels okay after fluids. I offered to give first dose antibiotics with Rocephin  here in ED. The patient declines, which I think is reasonable. She was able to ambulate on her own here in ED. Tolerated PO trial.   Will give her Rx for Keflex, given return precautions for worsening symptoms. She verbalizes understanding.  After  consideration of the diagnostic results and the patients response to treatment, I feel that the patent would benefit from discharge. The patient has been appropriately medically screened and/or stabilized in the ED. I have low suspicion for any other emergent medical condition which would require further screening, evaluation or treatment in the ED or require inpatient management. The patient is overall well appearing and non-toxic in appearance. They are hemodynamically stable at time of discharge.   Final Clinical Impression(s) / ED Diagnoses Final diagnoses:  Acute cystitis with hematuria    Rx / DC Orders ED Discharge Orders          Ordered    cephALEXin (KEFLEX) 500 MG capsule  2 times daily        05/26/21 1732              Mickie Hillier, PA-C 05/26/21 2353

## 2021-05-26 NOTE — ED Notes (Signed)
Pt was wheeled to restroom. Pt was unable to provide clean UA sample at this time due to having a BM.  ?

## 2021-05-26 NOTE — Discharge Instructions (Signed)
You were seen in the emergency department today for feeling weak and nauseous.  You were diagnosed with a UTI.  I have prescribed you antibiotics that you will take twice a day over the next 7 days.  Please return to the emergency department for worsening symptoms with fever, nausea vomiting and unable to drink fluids or worsening lethargy.  Otherwise please follow-up with your primary care provider in 1 week to ensure that your symptoms are resolving. ?

## 2021-05-26 NOTE — ED Triage Notes (Signed)
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 Family reports generalized weakness with lethargy that started yesterday. Patient reports not feeling well.  ?

## 2021-05-28 LAB — URINE CULTURE: Culture: <10000 — AB

## 2021-06-05 DIAGNOSIS — Z6823 Body mass index (BMI) 23.0-23.9, adult: Secondary | ICD-10-CM | POA: Diagnosis not present

## 2021-06-05 DIAGNOSIS — E559 Vitamin D deficiency, unspecified: Secondary | ICD-10-CM | POA: Diagnosis not present

## 2021-06-05 DIAGNOSIS — R7309 Other abnormal glucose: Secondary | ICD-10-CM | POA: Diagnosis not present

## 2021-06-05 DIAGNOSIS — I10 Essential (primary) hypertension: Secondary | ICD-10-CM | POA: Diagnosis not present

## 2021-06-05 DIAGNOSIS — D510 Vitamin B12 deficiency anemia due to intrinsic factor deficiency: Secondary | ICD-10-CM | POA: Diagnosis not present

## 2021-06-05 DIAGNOSIS — M069 Rheumatoid arthritis, unspecified: Secondary | ICD-10-CM | POA: Diagnosis not present

## 2021-06-05 DIAGNOSIS — J449 Chronic obstructive pulmonary disease, unspecified: Secondary | ICD-10-CM | POA: Diagnosis not present

## 2021-06-05 DIAGNOSIS — E039 Hypothyroidism, unspecified: Secondary | ICD-10-CM | POA: Diagnosis not present

## 2021-06-11 DIAGNOSIS — M059 Rheumatoid arthritis with rheumatoid factor, unspecified: Secondary | ICD-10-CM | POA: Diagnosis not present

## 2021-06-11 DIAGNOSIS — Z79899 Other long term (current) drug therapy: Secondary | ICD-10-CM | POA: Diagnosis not present

## 2021-06-11 DIAGNOSIS — M549 Dorsalgia, unspecified: Secondary | ICD-10-CM | POA: Diagnosis not present

## 2021-06-11 DIAGNOSIS — M1990 Unspecified osteoarthritis, unspecified site: Secondary | ICD-10-CM | POA: Diagnosis not present

## 2021-06-11 DIAGNOSIS — J449 Chronic obstructive pulmonary disease, unspecified: Secondary | ICD-10-CM | POA: Diagnosis not present

## 2021-06-11 DIAGNOSIS — M8580 Other specified disorders of bone density and structure, unspecified site: Secondary | ICD-10-CM | POA: Diagnosis not present

## 2021-06-23 DIAGNOSIS — J449 Chronic obstructive pulmonary disease, unspecified: Secondary | ICD-10-CM | POA: Diagnosis not present

## 2021-06-23 DIAGNOSIS — M059 Rheumatoid arthritis with rheumatoid factor, unspecified: Secondary | ICD-10-CM | POA: Diagnosis not present

## 2021-06-23 DIAGNOSIS — M549 Dorsalgia, unspecified: Secondary | ICD-10-CM | POA: Diagnosis not present

## 2021-06-23 DIAGNOSIS — M1990 Unspecified osteoarthritis, unspecified site: Secondary | ICD-10-CM | POA: Diagnosis not present

## 2021-06-23 DIAGNOSIS — Z79899 Other long term (current) drug therapy: Secondary | ICD-10-CM | POA: Diagnosis not present

## 2021-06-23 DIAGNOSIS — M8580 Other specified disorders of bone density and structure, unspecified site: Secondary | ICD-10-CM | POA: Diagnosis not present

## 2021-06-30 DIAGNOSIS — N189 Chronic kidney disease, unspecified: Secondary | ICD-10-CM | POA: Diagnosis not present

## 2021-06-30 DIAGNOSIS — Z6823 Body mass index (BMI) 23.0-23.9, adult: Secondary | ICD-10-CM | POA: Diagnosis not present

## 2021-07-16 ENCOUNTER — Encounter: Payer: Self-pay | Admitting: Internal Medicine

## 2021-07-31 DIAGNOSIS — J069 Acute upper respiratory infection, unspecified: Secondary | ICD-10-CM | POA: Diagnosis not present

## 2021-07-31 DIAGNOSIS — Z6823 Body mass index (BMI) 23.0-23.9, adult: Secondary | ICD-10-CM | POA: Diagnosis not present

## 2021-07-31 DIAGNOSIS — M069 Rheumatoid arthritis, unspecified: Secondary | ICD-10-CM | POA: Diagnosis not present

## 2021-07-31 DIAGNOSIS — J449 Chronic obstructive pulmonary disease, unspecified: Secondary | ICD-10-CM | POA: Diagnosis not present

## 2021-08-02 ENCOUNTER — Inpatient Hospital Stay (HOSPITAL_COMMUNITY)
Admission: EM | Admit: 2021-08-02 | Discharge: 2021-08-05 | DRG: 871 | Disposition: A | Discharge status: Home / Self Care | Payer: Medicare PPO | Attending: Internal Medicine | Admitting: Internal Medicine

## 2021-08-02 ENCOUNTER — Encounter (HOSPITAL_COMMUNITY): Payer: Self-pay

## 2021-08-02 ENCOUNTER — Other Ambulatory Visit: Payer: Self-pay

## 2021-08-02 ENCOUNTER — Emergency Department (HOSPITAL_COMMUNITY): Payer: Medicare PPO

## 2021-08-02 DIAGNOSIS — E039 Hypothyroidism, unspecified: Secondary | ICD-10-CM | POA: Diagnosis present

## 2021-08-02 DIAGNOSIS — E876 Hypokalemia: Secondary | ICD-10-CM | POA: Diagnosis present

## 2021-08-02 DIAGNOSIS — R739 Hyperglycemia, unspecified: Secondary | ICD-10-CM | POA: Diagnosis present

## 2021-08-02 DIAGNOSIS — Z7982 Long term (current) use of aspirin: Secondary | ICD-10-CM | POA: Diagnosis not present

## 2021-08-02 DIAGNOSIS — J449 Chronic obstructive pulmonary disease, unspecified: Secondary | ICD-10-CM

## 2021-08-02 DIAGNOSIS — Z0389 Encounter for observation for other suspected diseases and conditions ruled out: Secondary | ICD-10-CM | POA: Diagnosis not present

## 2021-08-02 DIAGNOSIS — J122 Parainfluenza virus pneumonia: Secondary | ICD-10-CM | POA: Diagnosis present

## 2021-08-02 DIAGNOSIS — Z885 Allergy status to narcotic agent status: Secondary | ICD-10-CM

## 2021-08-02 DIAGNOSIS — R6510 Systemic inflammatory response syndrome (SIRS) of non-infectious origin without acute organ dysfunction: Secondary | ICD-10-CM | POA: Diagnosis not present

## 2021-08-02 DIAGNOSIS — K8020 Calculus of gallbladder without cholecystitis without obstruction: Secondary | ICD-10-CM | POA: Diagnosis not present

## 2021-08-02 DIAGNOSIS — Z79899 Other long term (current) drug therapy: Secondary | ICD-10-CM | POA: Diagnosis not present

## 2021-08-02 DIAGNOSIS — I10 Essential (primary) hypertension: Secondary | ICD-10-CM | POA: Diagnosis present

## 2021-08-02 DIAGNOSIS — J441 Chronic obstructive pulmonary disease with (acute) exacerbation: Secondary | ICD-10-CM

## 2021-08-02 DIAGNOSIS — Z87891 Personal history of nicotine dependence: Secondary | ICD-10-CM

## 2021-08-02 DIAGNOSIS — M069 Rheumatoid arthritis, unspecified: Secondary | ICD-10-CM | POA: Diagnosis present

## 2021-08-02 DIAGNOSIS — Z8616 Personal history of COVID-19: Secondary | ICD-10-CM | POA: Diagnosis not present

## 2021-08-02 DIAGNOSIS — Z20822 Contact with and (suspected) exposure to covid-19: Secondary | ICD-10-CM | POA: Diagnosis present

## 2021-08-02 DIAGNOSIS — J470 Bronchiectasis with acute lower respiratory infection: Secondary | ICD-10-CM | POA: Diagnosis present

## 2021-08-02 DIAGNOSIS — R918 Other nonspecific abnormal finding of lung field: Secondary | ICD-10-CM | POA: Diagnosis not present

## 2021-08-02 DIAGNOSIS — J159 Unspecified bacterial pneumonia: Secondary | ICD-10-CM | POA: Diagnosis present

## 2021-08-02 DIAGNOSIS — D72829 Elevated white blood cell count, unspecified: Secondary | ICD-10-CM | POA: Diagnosis not present

## 2021-08-02 DIAGNOSIS — R531 Weakness: Secondary | ICD-10-CM | POA: Diagnosis not present

## 2021-08-02 DIAGNOSIS — E872 Acidosis, unspecified: Secondary | ICD-10-CM | POA: Diagnosis present

## 2021-08-02 DIAGNOSIS — Z8249 Family history of ischemic heart disease and other diseases of the circulatory system: Secondary | ICD-10-CM

## 2021-08-02 DIAGNOSIS — R410 Disorientation, unspecified: Secondary | ICD-10-CM | POA: Diagnosis present

## 2021-08-02 DIAGNOSIS — A419 Sepsis, unspecified organism: Principal | ICD-10-CM | POA: Diagnosis present

## 2021-08-02 DIAGNOSIS — I672 Cerebral atherosclerosis: Secondary | ICD-10-CM | POA: Diagnosis not present

## 2021-08-02 DIAGNOSIS — R9431 Abnormal electrocardiogram [ECG] [EKG]: Secondary | ICD-10-CM | POA: Diagnosis not present

## 2021-08-02 DIAGNOSIS — Z79890 Hormone replacement therapy (postmenopausal): Secondary | ICD-10-CM

## 2021-08-02 LAB — COMPREHENSIVE METABOLIC PANEL
ALT: 19 U/L (ref 0–44)
AST: 27 U/L (ref 15–41)
Albumin: 3.5 g/dL (ref 3.5–5.0)
Alkaline Phosphatase: 90 U/L (ref 38–126)
Anion gap: 7 (ref 5–15)
BUN: 32 mg/dL — ABNORMAL HIGH (ref 8–23)
CO2: 26 mmol/L (ref 22–32)
Calcium: 9 mg/dL (ref 8.9–10.3)
Chloride: 104 mmol/L (ref 98–111)
Creatinine, Ser: 1.09 mg/dL — ABNORMAL HIGH (ref 0.44–1.00)
GFR, Estimated: 54 mL/min — ABNORMAL LOW (ref >60)
Glucose, Bld: 164 mg/dL — ABNORMAL HIGH (ref 70–99)
Potassium: 3.3 mmol/L — ABNORMAL LOW (ref 3.5–5.1)
Sodium: 137 mmol/L (ref 135–145)
Total Bilirubin: 1.1 mg/dL (ref 0.3–1.2)
Total Protein: 7.7 g/dL (ref 6.5–8.1)

## 2021-08-02 LAB — CBC WITH DIFFERENTIAL/PLATELET
Abs Immature Granulocytes: 0.1 10*3/uL — ABNORMAL HIGH (ref 0.00–0.07)
Basophils Absolute: 0 10*3/uL (ref 0.0–0.1)
Basophils Relative: 0 %
Eosinophils Absolute: 0.1 10*3/uL (ref 0.0–0.5)
Eosinophils Relative: 1 %
HCT: 35.7 % — ABNORMAL LOW (ref 36.0–46.0)
Hemoglobin: 12.5 g/dL (ref 12.0–15.0)
Immature Granulocytes: 1 %
Lymphocytes Relative: 6 %
Lymphs Abs: 0.9 10*3/uL (ref 0.7–4.0)
MCH: 31.9 pg (ref 26.0–34.0)
MCHC: 35 g/dL (ref 30.0–36.0)
MCV: 91.1 fL (ref 80.0–100.0)
Monocytes Absolute: 1.1 10*3/uL — ABNORMAL HIGH (ref 0.1–1.0)
Monocytes Relative: 8 %
Neutro Abs: 12 10*3/uL — ABNORMAL HIGH (ref 1.7–7.7)
Neutrophils Relative %: 84 %
Platelets: 227 10*3/uL (ref 150–400)
RBC: 3.92 MIL/uL (ref 3.87–5.11)
RDW: 14.2 % (ref 11.5–15.5)
WBC: 14.3 10*3/uL — ABNORMAL HIGH (ref 4.0–10.5)
nRBC: 0 % (ref 0.0–0.2)

## 2021-08-02 LAB — URINALYSIS, ROUTINE W REFLEX MICROSCOPIC
Bilirubin Urine: NEGATIVE
Glucose, UA: NEGATIVE mg/dL
Ketones, ur: NEGATIVE mg/dL
Nitrite: NEGATIVE
Protein, ur: NEGATIVE mg/dL
RBC / HPF: >50 RBC/hpf — ABNORMAL HIGH (ref 0–5)
Specific Gravity, Urine: 1.013 (ref 1.005–1.030)
pH: 7 (ref 5.0–8.0)

## 2021-08-02 LAB — LACTIC ACID, PLASMA: Lactic Acid, Venous: 1.5 mmol/L (ref 0.5–1.9)

## 2021-08-02 LAB — RESP PANEL BY RT-PCR (FLU A&B, COVID) ARPGX2
Influenza A by PCR: NEGATIVE
Influenza B by PCR: NEGATIVE
SARS Coronavirus 2 by RT PCR: NEGATIVE

## 2021-08-02 LAB — PROTIME-INR
INR: 1.1 (ref 0.8–1.2)
Prothrombin Time: 14.2 seconds (ref 11.4–15.2)

## 2021-08-02 LAB — APTT: aPTT: 25 seconds (ref 24–36)

## 2021-08-02 MED ORDER — POTASSIUM CHLORIDE CRYS ER 20 MEQ PO TBCR
40.0000 meq | EXTENDED_RELEASE_TABLET | Freq: Once | ORAL | Status: AC
  Administered 2021-08-03: 40 meq via ORAL
  Filled 2021-08-02: qty 2

## 2021-08-02 MED ORDER — ACETAMINOPHEN 325 MG PO TABS
650.0000 mg | ORAL_TABLET | Freq: Once | ORAL | Status: AC
  Administered 2021-08-02: 650 mg via ORAL
  Filled 2021-08-02: qty 2

## 2021-08-02 MED ORDER — LACTATED RINGERS IV BOLUS (SEPSIS)
1000.0000 mL | Freq: Once | INTRAVENOUS | Status: AC
  Administered 2021-08-02: 1000 mL via INTRAVENOUS

## 2021-08-02 MED ORDER — LACTATED RINGERS IV SOLN: INTRAVENOUS | Status: DC

## 2021-08-02 MED ORDER — SODIUM CHLORIDE 0.9 % IV SOLN
2.0000 g | INTRAVENOUS | Status: DC
  Administered 2021-08-02: 2 g via INTRAVENOUS
  Filled 2021-08-02: qty 20

## 2021-08-02 MED ORDER — SODIUM CHLORIDE 0.9 % IV SOLN
500.0000 mg | INTRAVENOUS | Status: DC
  Administered 2021-08-02 – 2021-08-03 (×2): 500 mg via INTRAVENOUS
  Filled 2021-08-02 (×2): qty 5

## 2021-08-02 NOTE — ED Provider Notes (Incomplete)
[facility] EMERGENCY DEPARTMENT Provider Note   CSN: [id number] Arrival date & time: 08/02/21  2052     History {Add pertinent medical, surgical, social history, OB history to HPI:1} Chief Complaint  Patient presents with  . Weakness    Emily Valentine is a 71 y.o. female.  HPI She presents for evaluation of weakness with cough and sputum production which is green in color for 2 days.  Today she got weaker while working outside.  She was apparently given a trazodone today as well for unknown reason.  Patient has been complaining of generalized pain today.  She did not know she had a fever.  She has asthma but has not been using her inhaler much.  She is not coughing.  She denies dysuria.  She has had COVID vaccines but not a flu vaccine.   Home Medications Prior to Admission medications   Medication Sig Start Date End Date Taking? Authorizing Provider  acetaminophen (TYLENOL) 650 MG CR tablet Take 650 mg by mouth 2 (two) times daily as needed for pain.    [provider]  albuterol (VENTOLIN HFA) 108 (90 Base) MCG/ACT inhaler Inhale 1-2 puffs into the lungs every 6 (six) hours as needed for wheezing or shortness of breath.    [provider]  aspirin EC 81 MG tablet Take 81 mg by mouth daily. Swallow whole.    [provider]  Cyanocobalamin (VITAMIN B-12 IJ) Inject 1,000 mcg as directed every 30 (thirty) days.    [provider]  hydrocortisone cream 1 % Apply 1 application topically daily as needed for itching.    [provider]  levothyroxine (SYNTHROID, LEVOTHROID) 88 MCG tablet Take 88 mcg by mouth daily before breakfast.    [provider]  losartan-hydrochlorothiazide (HYZAAR) 100-25 MG tablet daily.    [provider]  methotrexate 2.5 MG tablet Take 4 tabs once per week 06/09/18   [provider]  metoprolol succinate (TOPROL-XL) 25 MG 24 hr tablet TAKE (1) TABLET BY MOUTH DAILY. 08/09/20   Isaiah Serge, NP  nitroGLYCERIN (NITROSTAT) 0.4 MG SL tablet Place 1 tablet (0.4 mg total) under the tongue every 5 (five) minutes as needed for chest pain. 08/09/20   Isaiah Serge, NP  Plecanatide (TRULANCE) 3 MG TABS Take 3 mg by mouth daily. Patient taking differently: Take 3 mg by mouth. 3 times per week 11/06/20   Erenest Rasher, PA-C  traMADol (ULTRAM) 50 MG tablet Take 50 mg by mouth as needed for moderate pain. 04/11/18   [provider]  Wheat Dextrin (BENEFIBER) POWD Take 1 Dose by mouth. 3 times per week    [provider]      Allergies    Codeine and Oxycontin [oxycodone]    Review of Systems   Review of Systems  Physical Exam Updated Vital Signs BP (!) 141/78   Pulse 82   Temp (!) 101.6 F (38.7 C) (Rectal)   Resp 20   Ht '5\' 2"'$  (1.575 m)   Wt 61.2 kg   SpO2 93%   BMI 24.68 kg/m  Physical Exam Vitals and nursing note reviewed.  Constitutional:      General: She is in acute distress.     Appearance: She is well-developed. She is ill-appearing. She is not toxic-appearing or diaphoretic.  HENT:     Head: Normocephalic and atraumatic.     Nose: No congestion or rhinorrhea.     Mouth/Throat:     Pharynx: No  oropharyngeal exudate or posterior oropharyngeal erythema.  Eyes:     Conjunctiva/sclera: Conjunctivae normal.     Pupils: Pupils are equal, round, and reactive to light.  Neck:     Trachea: Phonation normal.  Cardiovascular:     Rate and Rhythm: Normal rate.  Pulmonary:     Effort: Pulmonary effort is normal. No respiratory distress.     Breath sounds: No stridor.  Chest:     Chest wall: No tenderness.  Abdominal:     General: There is no distension.     Palpations: Abdomen is soft.     Tenderness: There is no abdominal tenderness. There is no guarding.  Musculoskeletal:        General: No swelling. Normal range of motion.     Cervical back: Normal range of motion and neck supple. Tenderness (Scattered posterior cervical chain adenopathy.   They are mildly tender.) present. No rigidity.  Skin:    General: Skin is warm and dry.     Coloration: Skin is not jaundiced.     Findings: No rash.  Neurological:     Mental Status: She is alert.     Motor: No abnormal muscle tone.     Comments: No dysarthria or aphasia.  Normal strength arms and legs bilaterally.  Psychiatric:        Mood and Affect: Mood normal.        Behavior: Behavior normal.    ED Results / Procedures / Treatments   Labs (all labs ordered are listed, but only abnormal results are displayed) Labs Reviewed  COMPREHENSIVE METABOLIC PANEL - Abnormal; Notable for the following components:      Result Value   Potassium 3.3 (*)    Glucose, Bld 164 (*)    BUN 32 (*)    Creatinine, Ser 1.09 (*)    GFR, Estimated 54 (*)    All other components within normal limits  CBC WITH DIFFERENTIAL/PLATELET - Abnormal; Notable for the following components:   WBC 14.3 (*)    HCT 35.7 (*)    Neutro Abs 12.0 (*)    Monocytes Absolute 1.1 (*)    Abs Immature Granulocytes 0.10 (*)    All other components within normal limits  URINALYSIS, ROUTINE W REFLEX MICROSCOPIC - Abnormal; Notable for the following components:   Hgb urine dipstick LARGE (*)    Leukocytes,Ua SMALL (*)    RBC / HPF >50 (*)    Bacteria, UA RARE (*)    All other components within normal limits  CULTURE, BLOOD (ROUTINE X 2)  CULTURE, BLOOD (ROUTINE X 2)  RESP PANEL BY RT-PCR (FLU A&B, COVID) ARPGX2  URINE CULTURE  LACTIC ACID, PLASMA  PROTIME-INR  APTT  LACTIC ACID, PLASMA    EKG EKG Interpretation  Date/Time:  Saturday Aug 02 2021 21:08:05 EDT Ventricular Rate:  77 PR Interval:  153 QRS Duration: 157 QT Interval:  409 QTC Calculation: 463 R Axis:   11 Text Interpretation: Sinus rhythm Consider right atrial enlargement IVCD, consider atypical RBBB since last tracing no significant change Confirmed by Daleen Bo [phone]) on 08/02/2021 9:27:47 PM  Radiology DG Chest Port 1 View  Result  Date: 08/02/2021 CLINICAL DATA:  Questionable sepsis. EXAM: PORTABLE CHEST 1 VIEW COMPARISON:  Chest x-ray 09/01/2011 FINDINGS: The heart size and mediastinal contours are within normal limits. Both lungs are clear. The visualized skeletal structures are unremarkable. IMPRESSION: No active disease. Electronically Signed   By: Ronney Asters M.D.   On: 08/02/2021 22:30  Procedures Procedures  {Document cardiac monitor, telemetry assessment procedure when appropriate:1}  Medications Ordered in ED Medications  lactated ringers infusion (has no administration in time range)  cefTRIAXone (ROCEPHIN) 2 g in sodium chloride 0.9 % 100 mL IVPB (0 g Intravenous Stopped 08/02/21 2230)  azithromycin (ZITHROMAX) 500 mg in sodium chloride 0.9 % 250 mL IVPB (500 mg Intravenous New Bag/Given 08/02/21 2234)  potassium chloride SA (KLOR-CON M) CR tablet 40 mEq (has no administration in time range)  lactated ringers bolus 1,000 mL (1,000 mLs Intravenous New Bag/Given 08/02/21 2231)    And  lactated ringers bolus 1,000 mL (1,000 mLs Intravenous New Bag/Given 08/02/21 2231)  acetaminophen (TYLENOL) tablet 650 mg (650 mg Oral Given 08/02/21 2247)    ED Course/ Medical Decision Making/ A&P Clinical Course as of 08/02/21 2306  Sat Aug 02, 2021  2247 DG Chest [place] 1 View [EW]    Clinical Course User Index [EW] Daleen Bo, MD                           Medical Decision Making Patient presenting with sepsis picture, and signs and symptoms of pneumonia.  She has a history of rheumatologic disease and COPD.    Rule out viral illness as well.  Initial blood pressure and heart rate were reassuring.  We will treat with empiric antibiotics for bacterial pneumonia, and begin assessment, with sepsis bundle.  Patient was treated by her PCP 4 days ago for "bacterial infection," with Augmentin, and cough suppressant.  She also was given a prescription for prednisone but did not take it.  Amount and/or Complexity of Data  Reviewed Independent Historian: EMS    Details: Patient is a poor historian. External Data Reviewed: notes.    Details: Prior notes for rheumatology visits and COPD visits. Labs: ordered.    Details: CBC, metabolic panel, urinalysis, urine culture, blood culture, viral panel-normal except potassium low, glucose high, BUN high, creatinine high, GFR low, white count high, urinalysis with hemoglobin and greater than 50 RBCs and rare bacteria. Radiology: ordered and independent interpretation performed. Decision-making details documented in ED Course.    Details: Chest x-ray ECG/medicine tests: ordered and independent interpretation performed.    Details: Cardiac monitor-normal sinus rhythm  Risk OTC drugs. Prescription drug management. Decision regarding hospitalization. Risk Details: Patient presenting with clinical concern for sepsis, with most likely etiology being pneumonia.  Chest x-ray does not show pneumonia.  She does have cough during ED evaluation.  She has urinary frequency, but no evidence for UTI urinalysis.  Mild persistent tachypnea with reassuring blood pressure and pulse.  COVID and flu test negative.  Patient has had some confusion and occasionally had difficulty giving history.  Some concern exists for meningitis/encephalitis.     {Document critical care time when appropriate:1} {Document review of labs and clinical decision tools ie heart score, Chads2Vasc2 etc:1}  {Document your independent review of radiology images, and any outside records:1} {Document your discussion with family members, caretakers, and with consultants:1} {Document social determinants of health affecting pt's care:1} {Document your decision making why or why not admission, treatments were needed:1} Final Clinical Impression(s) / ED Diagnoses Final diagnoses:  None    Rx / DC Orders ED Discharge Orders     None

## 2021-08-02 NOTE — ED Notes (Signed)
ED Provider at bedside. 

## 2021-08-02 NOTE — Sepsis Progress Note (Signed)
Elink following for Sepsis Protocol 

## 2021-08-02 NOTE — ED Provider Notes (Signed)
[facility] EMERGENCY DEPARTMENT Provider Note   CSN: [id number] Arrival date & time: 08/02/21  2052     History  Chief Complaint  Patient presents with   Weakness    Emily Valentine is a 71 y.o. female.  HPI She presents for evaluation of weakness with cough and sputum production which is green in color for 2 days.  Today she got weaker while working outside.  She was apparently given a trazodone today as well for unknown reason.  Patient has been complaining of generalized pain today.  She did not know she had a fever.  She has asthma but has not been using her inhaler much.  She is not coughing.  She denies dysuria.  She has had COVID vaccines but not a flu vaccine.   Home Medications Prior to Admission medications   Medication Sig Start Date End Date Taking? Authorizing Provider  acetaminophen (TYLENOL) 650 MG CR tablet Take 650 mg by mouth 2 (two) times daily as needed for pain.    [provider]  albuterol (VENTOLIN HFA) 108 (90 Base) MCG/ACT inhaler Inhale 1-2 puffs into the lungs every 6 (six) hours as needed for wheezing or shortness of breath.    [provider]  aspirin EC 81 MG tablet Take 81 mg by mouth daily. Swallow whole.    [provider]  Cyanocobalamin (VITAMIN B-12 IJ) Inject 1,000 mcg as directed every 30 (thirty) days.    [provider]  hydrocortisone cream 1 % Apply 1 application topically daily as needed for itching.    [provider]  levothyroxine (SYNTHROID, LEVOTHROID) 88 MCG tablet Take 88 mcg by mouth daily before breakfast.    [provider]  losartan-hydrochlorothiazide (HYZAAR) 100-25 MG tablet daily.    [provider]  methotrexate 2.5 MG tablet Take 4 tabs once per week 06/09/18   [provider]  metoprolol succinate (TOPROL-XL) 25 MG 24 hr tablet TAKE (1) TABLET BY MOUTH DAILY. 08/09/20   Isaiah Serge, NP  nitroGLYCERIN (NITROSTAT) 0.4 MG SL tablet Place 1 tablet (0.4  mg total) under the tongue every 5 (five) minutes as needed for chest pain. 08/09/20   Isaiah Serge, NP  Plecanatide (TRULANCE) 3 MG TABS Take 3 mg by mouth daily. Patient taking differently: Take 3 mg by mouth. 3 times per week 11/06/20   Erenest Rasher, PA-C  traMADol (ULTRAM) 50 MG tablet Take 50 mg by mouth as needed for moderate pain. 04/11/18   [provider]  Wheat Dextrin (BENEFIBER) POWD Take 1 Dose by mouth. 3 times per week    [provider]      Allergies    Codeine and Oxycontin [oxycodone]    Review of Systems   Review of Systems  Physical Exam Updated Vital Signs BP (!) 148/87   Pulse 82   Temp (!) 101.6 F (38.7 C) (Rectal)   Resp (!) 26   Ht '5\' 2"'$  (1.575 m)   Wt 61.2 kg   SpO2 93%   BMI 24.68 kg/m  Physical Exam Vitals and nursing note reviewed.  Constitutional:      General: She is in acute distress.     Appearance: She is well-developed. She is ill-appearing. She is not toxic-appearing or diaphoretic.  HENT:     Head: Normocephalic and atraumatic.     Nose: No congestion or rhinorrhea.     Mouth/Throat:     Pharynx: No oropharyngeal exudate or posterior oropharyngeal erythema.  Eyes:  Conjunctiva/sclera: Conjunctivae normal.     Pupils: Pupils are equal, round, and reactive to light.  Neck:     Trachea: Phonation normal.  Cardiovascular:     Rate and Rhythm: Normal rate.  Pulmonary:     Effort: Pulmonary effort is normal. No respiratory distress.     Breath sounds: No stridor.  Chest:     Chest wall: No tenderness.  Abdominal:     General: There is no distension.     Palpations: Abdomen is soft.     Tenderness: There is no abdominal tenderness. There is no guarding.  Musculoskeletal:        General: No swelling. Normal range of motion.     Cervical back: Normal range of motion and neck supple. Tenderness (Scattered posterior cervical chain adenopathy.  They are mildly tender.) present. No rigidity.  Skin:    General:  Skin is warm and dry.     Coloration: Skin is not jaundiced.     Findings: No rash.  Neurological:     Mental Status: She is alert.     Motor: No abnormal muscle tone.     Comments: No dysarthria or aphasia.  Normal strength arms and legs bilaterally.  Psychiatric:        Mood and Affect: Mood normal.        Behavior: Behavior normal.    ED Results / Procedures / Treatments   Labs (all labs ordered are listed, but only abnormal results are displayed) Labs Reviewed  COMPREHENSIVE METABOLIC PANEL - Abnormal; Notable for the following components:      Result Value   Potassium 3.3 (*)    Glucose, Bld 164 (*)    BUN 32 (*)    Creatinine, Ser 1.09 (*)    GFR, Estimated 54 (*)    All other components within normal limits  CBC WITH DIFFERENTIAL/PLATELET - Abnormal; Notable for the following components:   WBC 14.3 (*)    HCT 35.7 (*)    Neutro Abs 12.0 (*)    Monocytes Absolute 1.1 (*)    Abs Immature Granulocytes 0.10 (*)    All other components within normal limits  URINALYSIS, ROUTINE W REFLEX MICROSCOPIC - Abnormal; Notable for the following components:   Hgb urine dipstick LARGE (*)    Leukocytes,Ua SMALL (*)    RBC / HPF >50 (*)    Bacteria, UA RARE (*)    All other components within normal limits  RESP PANEL BY RT-PCR (FLU A&B, COVID) ARPGX2  CULTURE, BLOOD (ROUTINE X 2)  CULTURE, BLOOD (ROUTINE X 2)  URINE CULTURE  CSF CULTURE W GRAM STAIN  LACTIC ACID, PLASMA  PROTIME-INR  APTT  LACTIC ACID, PLASMA  MAGNESIUM  CSF CELL COUNT WITH DIFFERENTIAL  PROTEIN AND GLUCOSE, CSF    EKG EKG Interpretation  Date/Time:  Saturday Aug 02 2021 21:08:05 EDT Ventricular Rate:  77 PR Interval:  153 QRS Duration: 157 QT Interval:  409 QTC Calculation: 463 R Axis:   11 Text Interpretation: Sinus rhythm Consider right atrial enlargement IVCD, consider atypical RBBB since last tracing no significant change Confirmed by Daleen Bo [phone]) on 08/02/2021 9:27:47  PM  Radiology DG Chest Port 1 View  Result Date: 08/02/2021 CLINICAL DATA:  Questionable sepsis. EXAM: PORTABLE CHEST 1 VIEW COMPARISON:  Chest x-ray 09/01/2011 FINDINGS: The heart size and mediastinal contours are within normal limits. Both lungs are clear. The visualized skeletal structures are unremarkable. IMPRESSION: No active disease. Electronically Signed   By: Tina Griffiths.D.  On: 08/02/2021 22:30    Procedures .Lumbar Puncture  Date/Time: 08/03/2021 12:38 AM Performed by: Daleen Bo, MD Authorized by: Daleen Bo, MD   Consent:    Consent obtained:  Written   Consent given by:  Spouse   Risks, benefits, and alternatives were discussed: yes     Risks discussed:  Bleeding, infection and pain   Alternatives discussed:  No treatment Universal protocol:    Patient identity confirmed:  Verbally with patient Pre-procedure details:    Procedure purpose:  Diagnostic   Preparation: Patient was prepped and draped in usual sterile fashion   Anesthesia:    Anesthesia method:  Local infiltration   Local anesthetic:  Lidocaine 1% w/o epi Procedure details:    Lumbar space:  L3-L4 interspace   Patient position:  Sitting   Needle gauge:  22   Needle type:  Spinal needle - Quincke tip   Needle length (in):  3.5   Ultrasound guidance: no     Number of attempts:  3   Fluid appearance:  Clear   Tubes of fluid:  4   Total volume (ml):  4 Post-procedure details:    Puncture site:  Adhesive bandage applied   Procedure completion:  Tolerated well, no immediate complications    Medications Ordered in ED Medications  lactated ringers infusion (has no administration in time range)  cefTRIAXone (ROCEPHIN) 2 g in sodium chloride 0.9 % 100 mL IVPB (0 g Intravenous Stopped 08/02/21 2230)  azithromycin (ZITHROMAX) 500 mg in sodium chloride 0.9 % 250 mL IVPB (0 mg Intravenous Stopped 08/02/21 2346)  potassium chloride SA (KLOR-CON M) CR tablet 40 mEq (has no administration in time  range)  lactated ringers bolus 1,000 mL (0 mLs Intravenous Stopped 08/02/21 2346)    And  lactated ringers bolus 1,000 mL (0 mLs Intravenous Stopped 08/02/21 2346)  acetaminophen (TYLENOL) tablet 650 mg (650 mg Oral Given 08/02/21 2247)    ED Course/ Medical Decision Making/ A&P Clinical Course as of 08/03/21 0037  Sat Aug 02, 2021  2247 DG Chest [place] 1 View [EW]    Clinical Course User Index [EW] Daleen Bo, MD                           Medical Decision Making Patient presenting with sepsis picture, and signs and symptoms of pneumonia.  She has a history of rheumatologic disease and COPD.    Rule out viral illness as well.  Initial blood pressure and heart rate were reassuring.  We will treat with empiric antibiotics for bacterial pneumonia, and begin assessment, with sepsis bundle.  Patient was treated by her PCP 4 days ago for "bacterial infection," with Augmentin, and cough suppressant.  She also was given a prescription for prednisone but did not take it.  Amount and/or Complexity of Data Reviewed Independent Historian: EMS    Details: Patient is a poor historian. External Data Reviewed: notes.    Details: Prior notes for rheumatology visits and COPD visits. Labs: ordered.    Details: CBC, metabolic panel, urinalysis, urine culture, blood culture, viral panel-normal except potassium low, glucose high, BUN high, creatinine high, GFR low, white count high, urinalysis with hemoglobin and greater than 50 RBCs and rare bacteria. Radiology: ordered and independent interpretation performed. Decision-making details documented in ED Course.    Details: Chest x-ray ECG/medicine tests: ordered and independent interpretation performed.    Details: Cardiac monitor-normal sinus rhythm  Risk OTC drugs. Prescription drug management. Decision  regarding hospitalization. Risk Details: Patient presenting with clinical concern for sepsis, with most likely etiology being pneumonia.  Chest x-ray  does not show pneumonia.  She does have cough during ED evaluation.  She has urinary frequency, but no evidence for UTI urinalysis.  Mild persistent tachypnea with reassuring blood pressure and pulse.  COVID and flu test negative.  Patient has had some confusion and occasionally had difficulty giving history.  Some concern exists for meningitis/encephalitis.  LP done.  CT head ordered.  Critical Care Total time providing critical care: 55 minutes          Final Clinical Impression(s) / ED Diagnoses Final diagnoses:  Sepsis without acute organ dysfunction, due to unspecified organism [facility])  Confusion    Rx / DC Orders ED Discharge Orders     None         Daleen Bo, MD 08/03/21 [phone]

## 2021-08-02 NOTE — ED Triage Notes (Signed)
Pt c/o generalized weakness that started today around 16:00. EMS reports pt worked in the garden yesterday, felt tired, weak and sore today. EMS reports family gave pt a Trazodone today then pt felt lethargic.

## 2021-08-03 ENCOUNTER — Emergency Department (HOSPITAL_COMMUNITY): Payer: Medicare PPO

## 2021-08-03 ENCOUNTER — Inpatient Hospital Stay (HOSPITAL_COMMUNITY): Payer: Medicare PPO

## 2021-08-03 DIAGNOSIS — Z8249 Family history of ischemic heart disease and other diseases of the circulatory system: Secondary | ICD-10-CM | POA: Diagnosis not present

## 2021-08-03 DIAGNOSIS — E872 Acidosis, unspecified: Secondary | ICD-10-CM

## 2021-08-03 DIAGNOSIS — A419 Sepsis, unspecified organism: Secondary | ICD-10-CM

## 2021-08-03 DIAGNOSIS — I10 Essential (primary) hypertension: Secondary | ICD-10-CM | POA: Diagnosis present

## 2021-08-03 DIAGNOSIS — J122 Parainfluenza virus pneumonia: Secondary | ICD-10-CM | POA: Diagnosis present

## 2021-08-03 DIAGNOSIS — J441 Chronic obstructive pulmonary disease with (acute) exacerbation: Secondary | ICD-10-CM | POA: Diagnosis not present

## 2021-08-03 DIAGNOSIS — M069 Rheumatoid arthritis, unspecified: Secondary | ICD-10-CM | POA: Diagnosis present

## 2021-08-03 DIAGNOSIS — E039 Hypothyroidism, unspecified: Secondary | ICD-10-CM | POA: Diagnosis present

## 2021-08-03 DIAGNOSIS — E876 Hypokalemia: Secondary | ICD-10-CM | POA: Diagnosis present

## 2021-08-03 DIAGNOSIS — Z79890 Hormone replacement therapy (postmenopausal): Secondary | ICD-10-CM | POA: Diagnosis not present

## 2021-08-03 DIAGNOSIS — J470 Bronchiectasis with acute lower respiratory infection: Secondary | ICD-10-CM | POA: Diagnosis present

## 2021-08-03 DIAGNOSIS — J159 Unspecified bacterial pneumonia: Secondary | ICD-10-CM | POA: Diagnosis present

## 2021-08-03 DIAGNOSIS — R6510 Systemic inflammatory response syndrome (SIRS) of non-infectious origin without acute organ dysfunction: Secondary | ICD-10-CM

## 2021-08-03 DIAGNOSIS — R410 Disorientation, unspecified: Secondary | ICD-10-CM | POA: Diagnosis present

## 2021-08-03 DIAGNOSIS — Z885 Allergy status to narcotic agent status: Secondary | ICD-10-CM | POA: Diagnosis not present

## 2021-08-03 DIAGNOSIS — Z20822 Contact with and (suspected) exposure to covid-19: Secondary | ICD-10-CM | POA: Diagnosis present

## 2021-08-03 DIAGNOSIS — Z79899 Other long term (current) drug therapy: Secondary | ICD-10-CM | POA: Diagnosis not present

## 2021-08-03 DIAGNOSIS — R739 Hyperglycemia, unspecified: Secondary | ICD-10-CM | POA: Diagnosis present

## 2021-08-03 DIAGNOSIS — J449 Chronic obstructive pulmonary disease, unspecified: Secondary | ICD-10-CM

## 2021-08-03 DIAGNOSIS — D72829 Elevated white blood cell count, unspecified: Secondary | ICD-10-CM

## 2021-08-03 DIAGNOSIS — Z8616 Personal history of COVID-19: Secondary | ICD-10-CM | POA: Diagnosis not present

## 2021-08-03 DIAGNOSIS — Z7982 Long term (current) use of aspirin: Secondary | ICD-10-CM | POA: Diagnosis not present

## 2021-08-03 DIAGNOSIS — Z87891 Personal history of nicotine dependence: Secondary | ICD-10-CM | POA: Diagnosis not present

## 2021-08-03 LAB — CSF CELL COUNT WITH DIFFERENTIAL
Other Cells, CSF: NONE SEEN
RBC Count, CSF: 1 /mm3 — ABNORMAL HIGH
Tube #: 4
WBC, CSF: 2 /mm3 (ref 0–5)

## 2021-08-03 LAB — RESPIRATORY PANEL BY PCR

## 2021-08-03 LAB — COMPREHENSIVE METABOLIC PANEL
ALT: 17 U/L (ref 0–44)
AST: 25 U/L (ref 15–41)
Albumin: 3 g/dL — ABNORMAL LOW (ref 3.5–5.0)
Alkaline Phosphatase: 78 U/L (ref 38–126)
Anion gap: 6 (ref 5–15)
BUN: 23 mg/dL (ref 8–23)
CO2: 29 mmol/L (ref 22–32)
Calcium: 8.9 mg/dL (ref 8.9–10.3)
Chloride: 106 mmol/L (ref 98–111)
Creatinine, Ser: 1.06 mg/dL — ABNORMAL HIGH (ref 0.44–1.00)
GFR, Estimated: 56 mL/min — ABNORMAL LOW (ref >60)
Glucose, Bld: 109 mg/dL — ABNORMAL HIGH (ref 70–99)
Potassium: 3.6 mmol/L (ref 3.5–5.1)
Sodium: 141 mmol/L (ref 135–145)
Total Bilirubin: 0.7 mg/dL (ref 0.3–1.2)
Total Protein: 6.8 g/dL (ref 6.5–8.1)

## 2021-08-03 LAB — LACTIC ACID, PLASMA
Lactic Acid, Venous: 1.5 mmol/L (ref 0.5–1.9)
Lactic Acid, Venous: 1.6 mmol/L (ref 0.5–1.9)
Lactic Acid, Venous: 2.7 mmol/L (ref 0.5–1.9)

## 2021-08-03 LAB — CK: Total CK: 67 U/L (ref 38–234)

## 2021-08-03 LAB — CBC
HCT: 34 % — ABNORMAL LOW (ref 36.0–46.0)
Hemoglobin: 11.9 g/dL — ABNORMAL LOW (ref 12.0–15.0)
MCH: 32.3 pg (ref 26.0–34.0)
MCHC: 35 g/dL (ref 30.0–36.0)
MCV: 92.4 fL (ref 80.0–100.0)
Platelets: 219 10*3/uL (ref 150–400)
RBC: 3.68 MIL/uL — ABNORMAL LOW (ref 3.87–5.11)
RDW: 14.3 % (ref 11.5–15.5)
WBC: 15.3 10*3/uL — ABNORMAL HIGH (ref 4.0–10.5)
nRBC: 0 % (ref 0.0–0.2)

## 2021-08-03 LAB — MAGNESIUM: Magnesium: 1.4 mg/dL — ABNORMAL LOW (ref 1.7–2.4)

## 2021-08-03 LAB — FOLATE: Folate: 17.3 ng/mL (ref >5.9)

## 2021-08-03 LAB — PROCALCITONIN: Procalcitonin: 0.42 ng/mL

## 2021-08-03 LAB — PROTEIN AND GLUCOSE, CSF
Glucose, CSF: 78 mg/dL — ABNORMAL HIGH (ref 40–70)
Total  Protein, CSF: 26 mg/dL (ref 15–45)

## 2021-08-03 LAB — VITAMIN B12: Vitamin B-12: 463 pg/mL (ref 180–914)

## 2021-08-03 LAB — PHOSPHORUS: Phosphorus: 3.3 mg/dL (ref 2.5–4.6)

## 2021-08-03 LAB — SEDIMENTATION RATE: Sed Rate: 40 mm/hr — ABNORMAL HIGH (ref 0–22)

## 2021-08-03 LAB — MRSA NEXT GEN BY PCR, NASAL: MRSA by PCR Next Gen: NOT DETECTED

## 2021-08-03 LAB — GROUP A STREP BY PCR: Group A Strep by PCR: NOT DETECTED

## 2021-08-03 LAB — TSH: TSH: 0.376 u[IU]/mL (ref 0.350–4.500)

## 2021-08-03 LAB — C-REACTIVE PROTEIN: CRP: 10.7 mg/dL — ABNORMAL HIGH (ref <1.0)

## 2021-08-03 MED ORDER — CEFEPIME HCL 2 G IV SOLR
2.0000 g | Freq: Three times a day (TID) | INTRAVENOUS | Status: DC
  Administered 2021-08-03 – 2021-08-04 (×4): 2 g via INTRAVENOUS
  Filled 2021-08-03 (×5): qty 12.5

## 2021-08-03 MED ORDER — METOPROLOL SUCCINATE ER 25 MG PO TB24
25.0000 mg | ORAL_TABLET | Freq: Every day | ORAL | Status: DC
Start: 2021-08-03 — End: 2021-08-05
  Administered 2021-08-03 – 2021-08-05 (×3): 25 mg via ORAL
  Filled 2021-08-03 (×3): qty 1

## 2021-08-03 MED ORDER — LOSARTAN POTASSIUM-HCTZ 100-25 MG PO TABS: 1.0000 | ORAL_TABLET | Freq: Every day | ORAL | Status: DC

## 2021-08-03 MED ORDER — ALBUTEROL SULFATE (2.5 MG/3ML) 0.083% IN NEBU
3.0000 mL | INHALATION_SOLUTION | Freq: Four times a day (QID) | RESPIRATORY_TRACT | Status: DC | PRN
  Administered 2021-08-04: 3 mL via RESPIRATORY_TRACT
  Filled 2021-08-03: qty 3

## 2021-08-03 MED ORDER — LOSARTAN POTASSIUM 50 MG PO TABS: 100.0000 mg | ORAL_TABLET | Freq: Every day | ORAL | Status: DC

## 2021-08-03 MED ORDER — HYDROCHLOROTHIAZIDE 25 MG PO TABS: 25.0000 mg | ORAL_TABLET | Freq: Every day | ORAL | Status: DC

## 2021-08-03 MED ORDER — HYDROXYZINE HCL 25 MG PO TABS
25.0000 mg | ORAL_TABLET | Freq: Every evening | ORAL | Status: DC | PRN
  Administered 2021-08-03 – 2021-08-04 (×2): 25 mg via ORAL
  Filled 2021-08-03 (×2): qty 1

## 2021-08-03 MED ORDER — ONDANSETRON HCL 4 MG PO TABS: 4.0000 mg | ORAL_TABLET | Freq: Four times a day (QID) | ORAL | Status: DC | PRN

## 2021-08-03 MED ORDER — LACTATED RINGERS IV SOLN
INTRAVENOUS | Status: DC
Start: 2021-08-03 — End: 2021-08-05

## 2021-08-03 MED ORDER — HYDROCODONE-ACETAMINOPHEN 5-325 MG PO TABS
1.0000 | ORAL_TABLET | ORAL | Status: DC | PRN
  Administered 2021-08-03 – 2021-08-04 (×2): 1 via ORAL
  Filled 2021-08-03 (×2): qty 1

## 2021-08-03 MED ORDER — LEVOTHYROXINE SODIUM 88 MCG PO TABS
88.0000 ug | ORAL_TABLET | Freq: Every day | ORAL | Status: DC
  Administered 2021-08-04 – 2021-08-05 (×2): 88 ug via ORAL
  Filled 2021-08-03 (×2): qty 1

## 2021-08-03 MED ORDER — ACETAMINOPHEN 650 MG RE SUPP: 650.0000 mg | Freq: Four times a day (QID) | RECTAL | Status: DC | PRN

## 2021-08-03 MED ORDER — MAGNESIUM SULFATE 4 GM/100ML IV SOLN
4.0000 g | Freq: Once | INTRAVENOUS | Status: AC
Start: 2021-08-03 — End: 2021-08-03
  Administered 2021-08-03: 4 g via INTRAVENOUS
  Filled 2021-08-03: qty 100

## 2021-08-03 MED ORDER — METOPROLOL SUCCINATE ER 25 MG PO TB24: 12.5000 mg | ORAL_TABLET | Freq: Every day | ORAL | Status: DC

## 2021-08-03 MED ORDER — DM-GUAIFENESIN ER 30-600 MG PO TB12
1.0000 | ORAL_TABLET | Freq: Two times a day (BID) | ORAL | Status: DC
Start: 2021-08-03 — End: 2021-08-05
  Administered 2021-08-03 – 2021-08-05 (×5): 1 via ORAL
  Filled 2021-08-03 (×5): qty 1

## 2021-08-03 MED ORDER — BUDESONIDE 0.5 MG/2ML IN SUSP
0.5000 mg | Freq: Two times a day (BID) | RESPIRATORY_TRACT | Status: DC
  Administered 2021-08-03 – 2021-08-05 (×5): 0.5 mg via RESPIRATORY_TRACT
  Filled 2021-08-03 (×5): qty 2

## 2021-08-03 MED ORDER — IPRATROPIUM-ALBUTEROL 0.5-2.5 (3) MG/3ML IN SOLN
3.0000 mL | Freq: Four times a day (QID) | RESPIRATORY_TRACT | Status: DC
  Administered 2021-08-03 (×3): 3 mL via RESPIRATORY_TRACT
  Filled 2021-08-03 (×3): qty 3

## 2021-08-03 MED ORDER — IOHEXOL 300 MG/ML  SOLN
100.0000 mL | Freq: Once | INTRAMUSCULAR | Status: AC | PRN
  Administered 2021-08-03: 100 mL via INTRAVENOUS

## 2021-08-03 MED ORDER — ONDANSETRON HCL 4 MG/2ML IJ SOLN: 4.0000 mg | Freq: Four times a day (QID) | INTRAMUSCULAR | Status: DC | PRN

## 2021-08-03 MED ORDER — ENOXAPARIN SODIUM 40 MG/0.4ML IJ SOSY
40.0000 mg | PREFILLED_SYRINGE | INTRAMUSCULAR | Status: DC
  Administered 2021-08-03 – 2021-08-05 (×3): 40 mg via SUBCUTANEOUS
  Filled 2021-08-03 (×3): qty 0.4

## 2021-08-03 MED ORDER — ACETAMINOPHEN 325 MG PO TABS: 650.0000 mg | ORAL_TABLET | Freq: Four times a day (QID) | ORAL | Status: DC | PRN

## 2021-08-03 MED ORDER — ACETAMINOPHEN 325 MG PO TABS
650.0000 mg | ORAL_TABLET | Freq: Four times a day (QID) | ORAL | Status: DC | PRN
Start: 2021-08-03 — End: 2021-08-05

## 2021-08-03 NOTE — Assessment & Plan Note (Signed)
Continue levothyroxine 

## 2021-08-03 NOTE — Assessment & Plan Note (Addendum)
Start bronchodilators>>continue duonebs and pulmicort Stable on room air Nebulizer machine ordered for home

## 2021-08-03 NOTE — ED Notes (Signed)
Patient transported to CT 

## 2021-08-03 NOTE — ED Notes (Signed)
Date and time results received: 08/03/21 0041  Test: Lactic Acid Critical Value: 2.7  Name of Provider Notified: Dr. Betsey Holiday  Orders Received? Or Actions Taken?:

## 2021-08-03 NOTE — Progress Notes (Signed)
Pharmacy Antibiotic Note  Emily Valentine is a 71 y.o. female admitted on 08/02/2021 with sepsis.  Pharmacy has been consulted for Cefepime dosing.  Plan: Cefepime 2gm IV q8hrs  Height: '5\' 2"'$  (157.5 cm) Weight: 61.2 kg (134 lb 14.7 oz) IBW/kg (Calculated) : 50.1  Temp (24hrs), Avg:100.1 F (37.8 C), Min:99.2 F (37.3 C), Max:101.6 F (38.7 C)  Recent Labs  Lab 08/02/21 2154 08/02/21 2354 08/03/21 0355 08/03/21 0356 08/03/21 0644  WBC 14.3*  --   --  15.3*  --   CREATININE 1.09*  --   --  1.06*  --   LATICACIDVEN 1.5 2.7* 1.6  --  1.5    Estimated Creatinine Clearance: 41.9 mL/min (A) (by C-G formula based on SCr of 1.06 mg/dL (H)).    Allergies  Allergen Reactions   Codeine Nausea Only   Oxycontin [Oxycodone] Nausea And Vomiting    Antimicrobials this admission: Cefepime 5/21 >>    Dose adjustments this admission:   Microbiology results:  BCx: pending  UCx: pending   Sputum:    MRSA PCR:   Thank you for allowing pharmacy to be a part of this patient's care.  Hart Robinsons A 08/03/2021 8:03 AM

## 2021-08-03 NOTE — Assessment & Plan Note (Addendum)
Holding losartan/HCTZ secondary to soft blood pressure Restart metoprolol succinate Restart losartan

## 2021-08-03 NOTE — Assessment & Plan Note (Addendum)
Source of infection unclear presently, work-up in progress Lumbar puncture--not suggestive of CNS infection Follow blood cultures UA negative for pyuria Personally reviewed chest x-ray--no infiltrates CT chest CT abd/pelvs Check PCT ESR Check complements Check CRP Stool for C. Difficile Continue empiric ceftriaxone and azithromycin

## 2021-08-03 NOTE — Assessment & Plan Note (Addendum)
Repleted. °

## 2021-08-03 NOTE — H&P (Signed)
History and Physical    Patient: Emily Valentine KCL:[id number] DOB: [birth year] DOA: 08/02/2021 DOS: the patient was seen and examined on 08/03/2021 PCP: Sharilyn Sites, MD  Patient coming from: Home  Chief Complaint:  Chief Complaint  Patient presents with   Weakness   HPI: Emily Valentine is a 71 y.o. female with medical history significant of asthma, COPD, hypothyroidism, hypertension who presents to the emergency department due to 1 day onset of weakness and body soreness.  Apparently, patient exerted herself by doing some yard work (which she has not done in a while) a few days ago, she has chronic cough which appeared to worsen, so she went to her PCP about 2 days ago and she was diagnosed to have acute bronchitis, she was prescribed with amoxicillin, benzonatate, prednisone.  She has only taken amoxicillin for 2 days and yet to start other medications.  She woke up this morning, feeling very weak and complaining of generalized body pain, so EMS was activated, EMS team was told that patient had trazodone earlier today and this was assumed to be related to her being lethargic.  Patient was taken to the ED for further evaluation and management.   ED course: In the emergency department, she was febrile with a temperature of 101.59F, tachypneic and intermittently tachycardic.  BP was 141/78 and O2 sat was 93% on room air.  Work-up in the ED showed normal CBC except for leukocytosis, BMP showed hypokalemia, BUN 32, creatinine 1.09, CBG 164, lactic acid 1.5  > 2.7, magnesium 1.4.  Urinalysis was unimpressive for UTI. Influenza A, B, SARS coronavirus 2 was negative.  Blood culture was pending.  Spinal tap was done, but cells were too few to count. CT head without contrast showed no acute intracranial abnormalities Chest x-ray showed no active disease. She was treated with Tylenol for fever, IV hydration was provided, potassium was replenished and patient was started on IV antibiotics (ceftriaxone and  azithromycin)  Review of Systems: Review of systems as noted in the HPI. All other systems reviewed and are negative.  Past Medical History:  Diagnosis Date   Chronic constipation    COVID-19 11/2020   Diverticulitis 08/2016   History of echocardiogram    Echo (8/15): Vigorous LV function, EF 65-70%, normal wall motion, grade 1 diastolic dysfunction, mild to moderate TR, PASP 31 mm Hg   Hx of cardiovascular stress test    ETT-Myoview (8/15): no ischemia, EF 56%, normal study   Hypertension    Hypothyroid    Pernicious anemia    PVC's (premature ventricular contractions)    Rheumatoid arthritis ([place])    Right bundle branch block    S/P colonoscopy 2001, 2004   2001: hyperplastic polyp, 2004: normal, inflammatory polyp   Past Surgical History:  Procedure Laterality Date   ABDOMINAL HYSTERECTOMY     BACK SURGERY     BREAST BIOPSY Right 10/29/2010   BREAST BIOPSY Right 11/27/2014   BREAST EXCISIONAL BIOPSY Right 02/21/2015   BREAST LUMPECTOMY WITH RADIOACTIVE SEED LOCALIZATION Right 02/21/2015   Procedure: RIGHT BREAST LUMPECTOMY WITH RADIOACTIVE SEED LOCALIZATION;  Surgeon: Rolm Bookbinder, MD;  Location: [place];  Service: General;  Laterality: Right;   COLONOSCOPY  11/10/2010   Procedure: COLONOSCOPY;  Surgeon: Daneil Dolin, MD;  Location: [facility];  Service: Endoscopy;  Laterality: N/A;   COLONOSCOPY N/A 09/28/2016   Dr. Gala Romney; Diverticulosis with evidence of recent diverticulitis.  Next colonoscopy in 2023 given history of tubular adenomas in the  past.   THYROIDECTOMY      Social History:  reports that she has quit smoking. Her smoking use included cigarettes. She has quit using smokeless tobacco. She reports that she does not drink alcohol and does not use drugs.   Allergies  Allergen Reactions   Codeine Nausea Only   Oxycontin [Oxycodone] Nausea And Vomiting    Family History  Problem Relation Age of Onset   Stomach cancer Mother         deceased   Anemia Mother    Cancer Mother    Hypertension Mother    Thyroid disease Mother    Colon cancer Father        diagnosed age 24, died at age 21   Anemia Father    Cancer Father    Hypertension Father    Anemia Sister    Cancer Brother    Diabetes Sister    Hypertension Sister    Hypertension Brother    Thyroid disease Brother    Thyroid disease Sister      Prior to Admission medications   Medication Sig Start Date End Date Taking? Authorizing Provider  acetaminophen (TYLENOL) 650 MG CR tablet Take 650 mg by mouth 2 (two) times daily as needed for pain.    [provider]  albuterol (VENTOLIN HFA) 108 (90 Base) MCG/ACT inhaler Inhale 1-2 puffs into the lungs every 6 (six) hours as needed for wheezing or shortness of breath.    [provider]  aspirin EC 81 MG tablet Take 81 mg by mouth daily. Swallow whole.    [provider]  Cyanocobalamin (VITAMIN B-12 IJ) Inject 1,000 mcg as directed every 30 (thirty) days.    [provider]  hydrocortisone cream 1 % Apply 1 application topically daily as needed for itching.    [provider]  levothyroxine (SYNTHROID, LEVOTHROID) 88 MCG tablet Take 88 mcg by mouth daily before breakfast.    [provider]  losartan-hydrochlorothiazide (HYZAAR) 100-25 MG tablet daily.    [provider]  methotrexate 2.5 MG tablet Take 4 tabs once per week 06/09/18   [provider]  metoprolol succinate (TOPROL-XL) 25 MG 24 hr tablet TAKE (1) TABLET BY MOUTH DAILY. 08/09/20   Isaiah Serge, NP  nitroGLYCERIN (NITROSTAT) 0.4 MG SL tablet Place 1 tablet (0.4 mg total) under the tongue every 5 (five) minutes as needed for chest pain. 08/09/20   Isaiah Serge, NP  Plecanatide (TRULANCE) 3 MG TABS Take 3 mg by mouth daily. Patient taking differently: Take 3 mg by mouth. 3 times per week 11/06/20   Erenest Rasher, PA-C  traMADol (ULTRAM) 50 MG tablet Take 50 mg by mouth as needed  for moderate pain. 04/11/18   [provider]  Wheat Dextrin (BENEFIBER) POWD Take 1 Dose by mouth. 3 times per week    [provider]    Physical Exam: BP 128/83   Pulse 98   Temp 99.2 F (37.3 C) (Oral)   Resp (!) 22   Ht _0  (1.575 m)   Wt 61.2 kg   SpO2 95%   BMI 24.68 kg/m   General: 71 y.o. year-old female ill appearing but in no acute distress.  Alert and oriented x3. HEENT: NCAT, EOMI Neck: Supple, trachea medial Cardiovascular: Regular rate and rhythm with no rubs or gallops.  No thyromegaly or JVD noted.  No lower extremity edema. 2/4 pulses in all 4 extremities. Respiratory: Clear to auscultation with no wheezes or  rales. Good inspiratory effort. Abdomen: Soft, nontender nondistended with normal bowel sounds x4 quadrants. Muskuloskeletal: No cyanosis, clubbing or edema noted bilaterally Neuro: CN II-XII intact, strength 5/5 x 4, sensation, reflexes intact Skin: No ulcerative lesions noted or rashes Psychiatry: Judgement and insight appear normal. Mood is appropriate for condition and setting          Labs on Admission:  Basic Metabolic Panel: Recent Labs  Lab 08/02/21 2154 08/02/21 2354  NA 137  --   K 3.3*  --   CL 104  --   CO2 26  --   GLUCOSE 164*  --   BUN 32*  --   CREATININE 1.09*  --   CALCIUM 9.0  --   MG  --  1.4*   Liver Function Tests: Recent Labs  Lab 08/02/21 2154  AST 27  ALT 19  ALKPHOS 90  BILITOT 1.1  PROT 7.7  ALBUMIN 3.5   No results for input(s): LIPASE, AMYLASE in the last 168 hours. No results for input(s): AMMONIA in the last 168 hours. CBC: Recent Labs  Lab 08/02/21 2154  WBC 14.3*  NEUTROABS 12.0*  HGB 12.5  HCT 35.7*  MCV 91.1  PLT 227   Cardiac Enzymes: No results for input(s): CKTOTAL, CKMB, CKMBINDEX, TROPONINI in the last 168 hours.  BNP (last 3 results) No results for input(s): BNP in the last 8760 hours.  ProBNP (last 3 results) No results for input(s): PROBNP in the last 8760  hours.  CBG: No results for input(s): GLUCAP in the last 168 hours.  Radiological Exams on Admission: CT Head Wo Contrast  Result Date: 08/03/2021 CLINICAL DATA:  Meningitis/CNS infection suspected Patient reports generalized weakness. EXAM: CT HEAD WITHOUT CONTRAST TECHNIQUE: Contiguous axial images were obtained from the base of the skull through the vertex without intravenous contrast. RADIATION DOSE REDUCTION: This exam was performed according to the departmental dose-optimization program which includes automated exposure control, adjustment of the mA and/or kV according to patient size and/or use of iterative reconstruction technique. COMPARISON:  None Available. FINDINGS: Brain: Normal for age atrophy. No intracranial hemorrhage, mass effect, or midline shift. No hydrocephalus. The basilar cisterns are patent. There is mild periventricular and deep white matter hypodensity, nonspecific but typically chronic small vessel ischemia. No evidence of territorial infarct or acute ischemia. No extra-axial or intracranial fluid collection. Vascular: Atherosclerosis of skullbase vasculature without hyperdense vessel or abnormal calcification. Skull: No fracture or focal lesion. Sinuses/Orbits: Paranasal sinuses and mastoid air cells are clear. The visualized orbits are unremarkable. Other: None. IMPRESSION: 1. No acute intracranial abnormality. 2. Mild age-related atrophy and chronic small vessel ischemia. Electronically Signed   By: Keith Rake M.D.   On: 08/03/2021 01:08   DG Chest Port 1 View  Result Date: 08/02/2021 CLINICAL DATA:  Questionable sepsis. EXAM: PORTABLE CHEST 1 VIEW COMPARISON:  Chest x-ray 09/01/2011 FINDINGS: The heart size and mediastinal contours are within normal limits. Both lungs are clear. The visualized skeletal structures are unremarkable. IMPRESSION: No active disease. Electronically Signed   By: Ronney Asters M.D.   On: 08/02/2021 22:30    EKG: I independently viewed the  EKG done and my findings are as followed: Normal sinus rhythm at a rate of 77 bpm  Assessment/Plan Present on Admission:  SIRS (systemic inflammatory response syndrome) (HCC)  Essential hypertension  Principal Problem:   SIRS (systemic inflammatory response syndrome) (HCC) Active Problems:   Essential hypertension   COPD (chronic obstructive pulmonary disease) (HCC)   Lactic acidosis  Hypokalemia   Hypomagnesemia   Hyperglycemia   Leukocytosis   Hypothyroidism   SIRS with suspicion for sepsis Patient was febrile, tachypneic and WBC was elevated (met SIRS criteria).  Patient was diagnosed to have acute bronchitis by her PCP and was already started on antibiotics as an outpatient. Chest x-ray currently showed no acute cardiopulmonary process Patient was started on ceftriaxone and azithromycin, we shall continue same at this time with plan to de-escalate/discontinue based on blood culture, sputum culture, urine Legionella, strep pneumo and procalcitonin Continue Tylenol as needed Continue Mucinex, incentive spirometry, flutter valve   Lactic acidosis in the setting of above lactic acid 1.5  > 2.7; continue to trend lactic acid  Leukocytosis possibly due to SIRS/reactive WBC 14.3, continue treatment as described for SIRS  Hypokalemia K+ 3.3, this was replenished  Hypomagnesemia Mg 1.4, this will be replenished  Hyperglycemia possibly reactive Patient has no history of type 2 diabetes mellitus Continue to monitor blood glucose with morning labs and consider hemoglobin A1c if CBG continues to stay elevated  Asthma/COPD (not in acute exacerbation) Continue albuterol  Essential hypertension (controlled) Continue Hyzaar, Toprol-XL  Hypothyroidism Continue Synthroid  DVT prophylaxis: Lovenox  Code Status: Full code  Consults: None  Family Communication: Husband at bedside (all questions answered to satisfaction)  Severity of Illness: The appropriate patient status  for this patient is INPATIENT. Inpatient status is judged to be reasonable and necessary in order to provide the required intensity of service to ensure the patient's safety. The patient's presenting symptoms, physical exam findings, and initial radiographic and laboratory data in the context of their chronic comorbidities is felt to place them at high risk for further clinical deterioration. Furthermore, it is not anticipated that the patient will be medically stable for discharge from the hospital within 2 midnights of admission.   * I certify that at the point of admission it is my clinical judgment that the patient will require inpatient hospital care spanning beyond 2 midnights from the point of admission due to high intensity of service, high risk for further deterioration and high frequency of surveillance required.*  Author: Bernadette Hoit, DO 08/03/2021 4:02 AM  For on call review [email or web address].

## 2021-08-03 NOTE — Assessment & Plan Note (Addendum)
Repleted Mag 2.0 

## 2021-08-03 NOTE — Hospital Course (Signed)
71 year old female with history of rheumatoid arthritis, COPD, hypertension, hypothyroidism, IBS-C presenting with generalized weakness and diffuse myalgias and arthralgias.  The patient has been feeling bad for about a week with a cough and some shortness of breath and dyspnea on exertion.  She denies any nausea or vomiting.  She has had some loose stools but denies any frank diarrhea.  She has had some headaches, but denies any visual disturbance or focal extremity weakness.  She denies any chest pain, hemoptysis, dysuria or hematuria.  There is no hematochezia or melena.  She has upper abdominal discomfort which she feels may be related to her coughing.  She went to see her PCP 2 days prior to this admission and was placed on Tessalon Perles, prednisone, and amoxicillin.  She did not improve.  She continued to have increasing myalgias and generalized weakness. In the ED, the patient was febrile up to 101.6 F.  She was tachycardic into 110s.  He was hemodynamically stable with oxygen saturation 93-95% on room air.  Lumbar puncture was performed and showed WBC 2, RBC 1, protein 26, glucose 78.  UA was negative for pyuria.  Lactic acid 2.7>> 1.6.  CT of the brain was negative.  The patient was started on ceftriaxone, azithromycin, and lactated Ringer's.

## 2021-08-03 NOTE — Plan of Care (Signed)

## 2021-08-03 NOTE — Progress Notes (Signed)
PROGRESS NOTE  Emily Valentine ZOX:[id number] DOB: [birth year] DOA: 08/02/2021 PCP: Sharilyn Sites, MD  Brief History:  71 year old female with history of rheumatoid arthritis, COPD, hypertension, hypothyroidism, IBS-C presenting with generalized weakness and diffuse myalgias and arthralgias.  The patient has been feeling bad for about a week with a cough and some shortness of breath and dyspnea on exertion.  She denies any nausea or vomiting.  She has had some loose stools but denies any frank diarrhea.  She has had some headaches, but denies any visual disturbance or focal extremity weakness.  She denies any chest pain, hemoptysis, dysuria or hematuria.  There is no hematochezia or melena.  She has upper abdominal discomfort which she feels may be related to her coughing.  She went to see her PCP 2 days prior to this admission and was placed on Tessalon Perles, prednisone, and amoxicillin.  She did not improve.  She continued to have increasing myalgias and generalized weakness. In the ED, the patient was febrile up to 101.6 F.  She was tachycardic into 110s.  He was hemodynamically stable with oxygen saturation 93-95% on room air.  Lumbar puncture was performed and showed WBC 2, RBC 1, protein 26, glucose 78.  UA was negative for pyuria.  Lactic acid 2.7>> 1.6.  CT of the brain was negative.  The patient was started on ceftriaxone, azithromycin, and lactated Ringer's.     Assessment and Plan: * SIRS (systemic inflammatory response syndrome) (HCC) Source of infection unclear presently, work-up in progress Lumbar puncture--not suggestive of CNS infection Follow blood cultures UA negative for pyuria Personally reviewed chest x-ray--no infiltrates CT chest CT abd/pelvs Check PCT ESR Check complements Check CRP Stool for C. Difficile Continue empiric ceftriaxone and azithromycin  Hypothyroidism Continue levothyroxine  Hypomagnesemia Replete  Hypokalemia Replete  COPD  (chronic obstructive pulmonary disease) (HCC) Start bronchodilators Stable on room air  Essential hypertension Holding losartan/HCTZ secondary to soft blood pressure Restart metoprolol succinate      Family Communication:   spouse updated at bedside 5/21  Consultants:  none  Code Status:  FULL   DVT Prophylaxis:  [place] Lovenox   Procedures: As Listed in Progress Note Above  Antibiotics: Cefepime 5/21>> Azithro 5/20>>       Subjective: Patient complains of pain all over.  She denies any headache, chest pain, vomiting, dysuria, hematuria.  There is no hematochezia or melena.  She has a cough with green sputum.  She has upper abdominal pain.  Objective: Vitals:   08/03/21 0230 08/03/21 0330 08/03/21 0500 08/03/21 0600  BP: 128/83 113/76 129/80 135/79  Pulse: 98 100 92 90  Resp: (!) 22 (!) 28 (!) 23 (!) 24  Temp:      TempSrc:      SpO2: 95% 94% 94% 93%  Weight:      Height:        Intake/Output Summary (Last 24 hours) at 08/03/2021 0746 Last data filed at 08/03/2021 0559 Gross per 24 hour  Intake 2450 ml  Output 1000 ml  Net 1450 ml   Weight change:  Exam:  General:  Pt is alert, follows commands appropriately, not in acute distress HEENT: No icterus, No thrush, No neck mass, [place]/AT Cardiovascular: RRR, S1/S2, no rubs, no gallops Respiratory: Bibasilar rales.  No wheezing.  Good air movement Abdomen: Soft/+BS, non tender, non distended, no guarding Extremities: No edema, No lymphangitis, No petechiae, No rashes, no synovitis   Data Reviewed: I  have personally reviewed following labs and imaging studies Basic Metabolic Panel: Recent Labs  Lab 08/02/21 2154 08/02/21 2354 08/03/21 0356  NA 137  --  141  K 3.3*  --  3.6  CL 104  --  106  CO2 26  --  29  GLUCOSE 164*  --  109*  BUN 32*  --  23  CREATININE 1.09*  --  1.06*  CALCIUM 9.0  --  8.9  MG  --  1.4*  --   PHOS  --   --  3.3   Liver Function Tests: Recent Labs  Lab 08/02/21 2154  08/03/21 0356  AST 27 25  ALT 19 17  ALKPHOS 90 78  BILITOT 1.1 0.7  PROT 7.7 6.8  ALBUMIN 3.5 3.0*   No results for input(s): LIPASE, AMYLASE in the last 168 hours. No results for input(s): AMMONIA in the last 168 hours. Coagulation Profile: Recent Labs  Lab 08/02/21 2154  INR 1.1   CBC: Recent Labs  Lab 08/02/21 2154 08/03/21 0356  WBC 14.3* 15.3*  NEUTROABS 12.0*  --   HGB 12.5 11.9*  HCT 35.7* 34.0*  MCV 91.1 92.4  PLT 227 219   Cardiac Enzymes: No results for input(s): CKTOTAL, CKMB, CKMBINDEX, TROPONINI in the last 168 hours. BNP: Invalid input(s): POCBNP CBG: No results for input(s): GLUCAP in the last 168 hours. HbA1C: No results for input(s): HGBA1C in the last 72 hours. Urine analysis:    Component Value Date/Time   COLORURINE YELLOW 08/02/2021 2134   APPEARANCEUR CLEAR 08/02/2021 2134   LABSPEC 1.013 08/02/2021 2134   PHURINE 7.0 08/02/2021 2134   GLUCOSEU NEGATIVE 08/02/2021 2134   HGBUR LARGE (A) 08/02/2021 2134   BILIRUBINUR NEGATIVE 08/02/2021 2134   KETONESUR NEGATIVE 08/02/2021 2134   PROTEINUR NEGATIVE 08/02/2021 2134   NITRITE NEGATIVE 08/02/2021 2134   LEUKOCYTESUR SMALL (A) 08/02/2021 2134   Sepsis Labs: _0 (procalcitonin:4,lacticidven:4) ) Recent Results (from the past 240 hour(s))  Blood Culture (routine x 2)     Status: None (Preliminary result)   Collection Time: 08/02/21  9:55 PM   Specimen: Right Antecubital; Blood  Result Value Ref Range Status   Specimen Description   Final    RIGHT ANTECUBITAL BOTTLES DRAWN AEROBIC AND ANAEROBIC   Special Requests Blood Culture adequate volume  Final   Culture   Final    NO GROWTH <12 HOURS Performed at [facility], [address] [place], [place] [address]    Report Status PENDING  Incomplete  Blood Culture (routine x 2)     Status: None (Preliminary result)   Collection Time: 08/02/21 10:01 PM   Specimen: BLOOD RIGHT HAND  Result Value Ref Range Status   Specimen  Description   Final    BLOOD RIGHT HAND BOTTLES DRAWN AEROBIC AND ANAEROBIC   Special Requests Blood Culture adequate volume  Final   Culture   Final    NO GROWTH <12 HOURS Performed at [facility], [address], [place], [place] [address]    Report Status PENDING  Incomplete  Resp Panel by RT-PCR (Flu A&B, Covid) Nasopharyngeal Swab     Status: None   Collection Time: 08/02/21 10:30 PM   Specimen: Nasopharyngeal Swab; Nasopharyngeal(NP) swabs in vial transport medium  Result Value Ref Range Status   SARS Coronavirus 2 by RT PCR NEGATIVE NEGATIVE Final    Comment: (NOTE) SARS-CoV-2 target nucleic acids are NOT DETECTED.  The SARS-CoV-2 RNA is generally detectable in upper respiratory specimens during the acute  phase of infection. The lowest concentration of SARS-CoV-2 viral copies this assay can detect is 138 copies/mL. A negative result does not preclude SARS-Cov-2 infection and should not be used as the sole basis for treatment or other patient management decisions. A negative result may occur with  improper specimen collection/handling, submission of specimen other than nasopharyngeal swab, presence of viral mutation(s) within the areas targeted by this assay, and inadequate number of viral copies(<138 copies/mL). A negative result must be combined with clinical observations, patient history, and epidemiological information. The expected result is Negative.  Fact Sheet for Patients:  EntrepreneurPulse.com.au  Fact Sheet for Healthcare Providers:  IncredibleEmployment.be  This test is no t yet approved or cleared by the [place] FDA and  has been authorized for detection and/or diagnosis of SARS-CoV-2 by FDA under an Emergency Use Authorization (EUA). This EUA will remain  in effect (meaning this test can be used) for the duration of the COVID-19 declaration under Section 564(b)(1) of the Act, 21 U.S.C.section 360bbb-3(b)(1), unless  the authorization is terminated  or revoked sooner.       Influenza A by PCR NEGATIVE NEGATIVE Final   Influenza B by PCR NEGATIVE NEGATIVE Final    Comment: (NOTE) The Xpert Xpress SARS-CoV-2/FLU/RSV plus assay is intended as an aid in the diagnosis of influenza from Nasopharyngeal swab specimens and should not be used as a sole basis for treatment. Nasal washings and aspirates are unacceptable for Xpert Xpress SARS-CoV-2/FLU/RSV testing.  Fact Sheet for Patients: EntrepreneurPulse.com.au  Fact Sheet for Healthcare Providers: IncredibleEmployment.be  This test is not yet approved or cleared by the [place] FDA and has been authorized for detection and/or diagnosis of SARS-CoV-2 by FDA under an Emergency Use Authorization (EUA). This EUA will remain in effect (meaning this test can be used) for the duration of the COVID-19 declaration under Section 564(b)(1) of the Act, 21 U.S.C. section 360bbb-3(b)(1), unless the authorization is terminated or revoked.  Performed at [facility], [address], [place], [place] [address]   CSF culture w Gram Stain     Status: None (Preliminary result)   Collection Time: 08/03/21 12:37 AM   Specimen: CSF; Cerebrospinal Fluid  Result Value Ref Range Status   Specimen Description   Final    CSF Performed at [facility], [address], [place], [place] [address]    Special Requests   Final    NONE Performed at [facility], [address], [place], [place] [address]    Gram Stain   Final    NO WBC SEEN NO ORGANISMS SEEN GRAM STAIN REVIEWED-AGREE WITH RESULT Performed at [facility], [address] [place], [place] [address]    Culture PENDING  Incomplete   Report Status PENDING  Incomplete     Scheduled Meds:  dextromethorphan-guaiFENesin  1 tablet Oral BID   enoxaparin (LOVENOX) injection  40 mg Subcutaneous Q24H   Continuous Infusions:  azithromycin Stopped (08/02/21 2346)    cefTRIAXone (ROCEPHIN)  IV Stopped (08/02/21 2230)   lactated ringers      Procedures/Studies: CT Head Wo Contrast  Result Date: 08/03/2021 CLINICAL DATA:  Meningitis/CNS infection suspected Patient reports generalized weakness. EXAM: CT HEAD WITHOUT CONTRAST TECHNIQUE: Contiguous axial images were obtained from the base of the skull through the vertex without intravenous contrast. RADIATION DOSE REDUCTION: This exam was performed according to the departmental dose-optimization program which includes automated exposure control, adjustment of the mA and/or kV according to patient size and/or use of iterative reconstruction technique.  COMPARISON:  None Available. FINDINGS: Brain: Normal for age atrophy. No intracranial hemorrhage, mass effect, or midline shift. No hydrocephalus. The basilar cisterns are patent. There is mild periventricular and deep white matter hypodensity, nonspecific but typically chronic small vessel ischemia. No evidence of territorial infarct or acute ischemia. No extra-axial or intracranial fluid collection. Vascular: Atherosclerosis of skullbase vasculature without hyperdense vessel or abnormal calcification. Skull: No fracture or focal lesion. Sinuses/Orbits: Paranasal sinuses and mastoid air cells are clear. The visualized orbits are unremarkable. Other: None. IMPRESSION: 1. No acute intracranial abnormality. 2. Mild age-related atrophy and chronic small vessel ischemia. Electronically Signed   By: Keith Rake M.D.   On: 08/03/2021 01:08   DG Chest Port 1 View  Result Date: 08/02/2021 CLINICAL DATA:  Questionable sepsis. EXAM: PORTABLE CHEST 1 VIEW COMPARISON:  Chest x-ray 09/01/2011 FINDINGS: The heart size and mediastinal contours are within normal limits. Both lungs are clear. The visualized skeletal structures are unremarkable. IMPRESSION: No active disease. Electronically Signed   By: Ronney Asters M.D.   On: 08/02/2021 22:30    Orson Eva, DO  [facility]  If  7PM-7AM, please contact night-coverage [email or web address] Password [facility] 08/03/2021, 7:46 AM   LOS: 0 days

## 2021-08-04 DIAGNOSIS — J122 Parainfluenza virus pneumonia: Secondary | ICD-10-CM | POA: Diagnosis not present

## 2021-08-04 DIAGNOSIS — E876 Hypokalemia: Secondary | ICD-10-CM | POA: Diagnosis not present

## 2021-08-04 DIAGNOSIS — A419 Sepsis, unspecified organism: Secondary | ICD-10-CM | POA: Diagnosis not present

## 2021-08-04 LAB — CBC WITH DIFFERENTIAL/PLATELET
Abs Immature Granulocytes: 0.04 10*3/uL (ref 0.00–0.07)
Basophils Absolute: 0 10*3/uL (ref 0.0–0.1)
Basophils Relative: 0 %
Eosinophils Absolute: 0.3 10*3/uL (ref 0.0–0.5)
Eosinophils Relative: 3 %
HCT: 29.8 % — ABNORMAL LOW (ref 36.0–46.0)
Hemoglobin: 10.1 g/dL — ABNORMAL LOW (ref 12.0–15.0)
Immature Granulocytes: 0 %
Lymphocytes Relative: 23 %
Lymphs Abs: 2.3 10*3/uL (ref 0.7–4.0)
MCH: 31.6 pg (ref 26.0–34.0)
MCHC: 33.9 g/dL (ref 30.0–36.0)
MCV: 93.1 fL (ref 80.0–100.0)
Monocytes Absolute: 1.7 10*3/uL — ABNORMAL HIGH (ref 0.1–1.0)
Monocytes Relative: 16 %
Neutro Abs: 6 10*3/uL (ref 1.7–7.7)
Neutrophils Relative %: 58 %
Platelets: 178 10*3/uL (ref 150–400)
RBC: 3.2 MIL/uL — ABNORMAL LOW (ref 3.87–5.11)
RDW: 14.1 % (ref 11.5–15.5)
WBC: 10.3 10*3/uL (ref 4.0–10.5)
nRBC: 0 % (ref 0.0–0.2)

## 2021-08-04 LAB — MAGNESIUM: Magnesium: 2.1 mg/dL (ref 1.7–2.4)

## 2021-08-04 LAB — BASIC METABOLIC PANEL
Anion gap: 5 (ref 5–15)
BUN: 17 mg/dL (ref 8–23)
CO2: 25 mmol/L (ref 22–32)
Calcium: 8.1 mg/dL — ABNORMAL LOW (ref 8.9–10.3)
Chloride: 108 mmol/L (ref 98–111)
Creatinine, Ser: 1.05 mg/dL — ABNORMAL HIGH (ref 0.44–1.00)
GFR, Estimated: 57 mL/min — ABNORMAL LOW (ref >60)
Glucose, Bld: 101 mg/dL — ABNORMAL HIGH (ref 70–99)
Potassium: 3.4 mmol/L — ABNORMAL LOW (ref 3.5–5.1)
Sodium: 138 mmol/L (ref 135–145)

## 2021-08-04 LAB — PROCALCITONIN: Procalcitonin: 0.51 ng/mL

## 2021-08-04 MED ORDER — IPRATROPIUM-ALBUTEROL 0.5-2.5 (3) MG/3ML IN SOLN: 3.0000 mL | Freq: Four times a day (QID) | RESPIRATORY_TRACT | 1 refills | Status: DC | PRN

## 2021-08-04 MED ORDER — AZITHROMYCIN 250 MG PO TABS
500.0000 mg | ORAL_TABLET | ORAL | Status: DC
  Administered 2021-08-04: 500 mg via ORAL
  Filled 2021-08-04: qty 2

## 2021-08-04 MED ORDER — LOSARTAN POTASSIUM 50 MG PO TABS
25.0000 mg | ORAL_TABLET | Freq: Every day | ORAL | Status: DC
  Administered 2021-08-04 – 2021-08-05 (×2): 25 mg via ORAL
  Filled 2021-08-04 (×2): qty 1

## 2021-08-04 MED ORDER — SODIUM CHLORIDE 0.9 % IV SOLN
2.0000 g | Freq: Two times a day (BID) | INTRAVENOUS | Status: DC
  Administered 2021-08-04 – 2021-08-05 (×2): 2 g via INTRAVENOUS
  Filled 2021-08-04 (×2): qty 12.5

## 2021-08-04 MED ORDER — POTASSIUM CHLORIDE CRYS ER 20 MEQ PO TBCR
20.0000 meq | EXTENDED_RELEASE_TABLET | Freq: Once | ORAL | Status: AC
  Administered 2021-08-04: 20 meq via ORAL
  Filled 2021-08-04: qty 1

## 2021-08-04 MED ORDER — IPRATROPIUM-ALBUTEROL 0.5-2.5 (3) MG/3ML IN SOLN
3.0000 mL | Freq: Three times a day (TID) | RESPIRATORY_TRACT | Status: DC
  Administered 2021-08-05: 3 mL via RESPIRATORY_TRACT
  Filled 2021-08-04: qty 3

## 2021-08-04 MED ORDER — IPRATROPIUM-ALBUTEROL 0.5-2.5 (3) MG/3ML IN SOLN
3.0000 mL | Freq: Four times a day (QID) | RESPIRATORY_TRACT | Status: DC
  Administered 2021-08-04 (×3): 3 mL via RESPIRATORY_TRACT
  Filled 2021-08-04 (×3): qty 3

## 2021-08-04 NOTE — TOC Progression Note (Signed)
  Transition of Care [facility]) Screening Note   Patient Details  Name: Emily Valentine Date of Birth: January 25, 1951   Transition of Care [facility]) CM/SW Contact:    Shade Flood, LCSW Phone Number: 08/04/2021, 9:46 AM    Transition of Care Department [facility]) has reviewed patient and no TOC needs have been identified at this time. We will continue to monitor patient advancement through interdisciplinary progression rounds. If new patient transition needs arise, please place a TOC consult.

## 2021-08-04 NOTE — Assessment & Plan Note (Signed)
Source of infection = pneumonia Lumbar puncture--not suggestive of CNS infection Follow blood cultures--neg to date UA negative for pyuria Personally reviewed chest x-ray--no infiltrates CT chest--cylindrical bronchiectasis with peribronchovascular interstitial thickening; diffuse bronchial wall thickening CT abd/pelvs-mural thickening of prepyloric stomach Check PCT 0.42>>0.51 ESR--40 Check complements--pending Check CRP--10.7 Stool for C. Difficile--no further diarrhea Continue empiric ceftriaxone and azithromycin

## 2021-08-04 NOTE — Assessment & Plan Note (Addendum)
Viral respiratory panel +parainfluenza 3 Patient has superimposed bacterial pneumonia --suspect atypical organism --continue ceftriaxone and azithro --follow blood cultures--neg to date --d/c home with azithromycin x 2 more days

## 2021-08-04 NOTE — Progress Notes (Signed)
PROGRESS NOTE  Emily Valentine HWK:[id number] DOB: Aug 10, 1950 DOA: 08/02/2021 PCP: Sharilyn Sites, MD  Brief History:  71 year old female with history of rheumatoid arthritis, COPD, hypertension, hypothyroidism, IBS-C presenting with generalized weakness and diffuse myalgias and arthralgias.  The patient has been feeling bad for about a week with a cough and some shortness of breath and dyspnea on exertion.  She denies any nausea or vomiting.  She has had some loose stools but denies any frank diarrhea.  She has had some headaches, but denies any visual disturbance or focal extremity weakness.  She denies any chest pain, hemoptysis, dysuria or hematuria.  There is no hematochezia or melena.  She has upper abdominal discomfort which she feels may be related to her coughing.  She went to see her PCP 2 days prior to this admission and was placed on Tessalon Perles, prednisone, and amoxicillin.  She did not improve.  She continued to have increasing myalgias and generalized weakness. In the ED, the patient was febrile up to 101.6 F.  She was tachycardic into 110s.  He was hemodynamically stable with oxygen saturation 93-95% on room air.  Lumbar puncture was performed and showed WBC 2, RBC 1, protein 26, glucose 78.  UA was negative for pyuria.  Lactic acid 2.7>> 1.6.  CT of the brain was negative.  The patient was started on ceftriaxone, azithromycin, and lactated Ringer's.    Assessment and Plan: * SIRS (systemic inflammatory response syndrome) (HCC) Source of infection unclear presently, work-up in progress Lumbar puncture--not suggestive of CNS infection Follow blood cultures UA negative for pyuria Personally reviewed chest x-ray--no infiltrates CT chest CT abd/pelvs Check PCT ESR Check complements Check CRP Stool for C. Difficile Continue empiric ceftriaxone and azithromycin  Hypothyroidism Continue levothyroxine  Hypomagnesemia Replete  Hypokalemia Replete  COPD (chronic  obstructive pulmonary disease) (HCC) Start bronchodilators Stable on room air  Essential hypertension Holding losartan/HCTZ secondary to soft blood pressure Restart metoprolol succinate  Family Communication:   spouse updated at bedside 5/22   Consultants:  none   Code Status:  FULL    DVT Prophylaxis:  [place] Lovenox     Procedures: As Listed in Progress Note Above   Antibiotics: Cefepime 5/21>> Azithro 5/20>>      Subjective: Patient is feeling better.  Able to ambulate to BR without leg pain.  Still has some dyspnea on exertion and dry cough.  Denies cp, n/v/d, abd pain  Objective: Vitals:   08/04/21 0733 08/04/21 1035 08/04/21 1409 08/04/21 1410  BP:  (!) 152/91  (!) 180/96  Pulse:  83  75  Resp:  20    Temp:  98.2 F (36.8 C)    TempSrc:  Oral    SpO2: 98% 98% 100% 100%  Weight:      Height:        Intake/Output Summary (Last 24 hours) at 08/04/2021 1419 Last data filed at 08/04/2021 1300 Gross per 24 hour  Intake 1555 ml  Output 1100 ml  Net 455 ml   Weight change:  Exam:  General:  Pt is alert, follows commands appropriately, not in acute distress HEENT: No icterus, No thrush, No neck mass, [place]/AT Cardiovascular: RRR, S1/S2, no rubs, no gallops Respiratory: bibasilar rales. No wheeze Abdomen: Soft/+BS, non tender, non distended, no guarding Extremities: No edema, No lymphangitis, No petechiae, No rashes, no synovitis Neuro:  CN II-XII intact, strength 4/5 in RUE, RLE, strength 4/5 LUE, LLE; sensation intact bilateral;  no dysmetria; babinski equivocal;  DTR 2/4 at bilateral patellar and brachial    Data Reviewed: I have personally reviewed following labs and imaging studies Basic Metabolic Panel: Recent Labs  Lab 08/02/21 2154 08/02/21 2354 08/03/21 0356 08/04/21 0244  NA 137  --  141 138  K 3.3*  --  3.6 3.4*  CL 104  --  106 108  CO2 26  --  29 25  GLUCOSE 164*  --  109* 101*  BUN 32*  --  23 17  CREATININE 1.09*  --  1.06* 1.05*   CALCIUM 9.0  --  8.9 8.1*  MG  --  1.4*  --  2.1  PHOS  --   --  3.3  --    Liver Function Tests: Recent Labs  Lab 08/02/21 2154 08/03/21 0356  AST 27 25  ALT 19 17  ALKPHOS 90 78  BILITOT 1.1 0.7  PROT 7.7 6.8  ALBUMIN 3.5 3.0*   No results for input(s): LIPASE, AMYLASE in the last 168 hours. No results for input(s): AMMONIA in the last 168 hours. Coagulation Profile: Recent Labs  Lab 08/02/21 2154  INR 1.1   CBC: Recent Labs  Lab 08/02/21 2154 08/03/21 0356 08/04/21 0244  WBC 14.3* 15.3* 10.3  NEUTROABS 12.0*  --  6.0  HGB 12.5 11.9* 10.1*  HCT 35.7* 34.0* 29.8*  MCV 91.1 92.4 93.1  PLT 227 219 178   Cardiac Enzymes: Recent Labs  Lab 08/03/21 0842  CKTOTAL 67   BNP: Invalid input(s): POCBNP CBG: No results for input(s): GLUCAP in the last 168 hours. HbA1C: No results for input(s): HGBA1C in the last 72 hours. Urine analysis:    Component Value Date/Time   COLORURINE YELLOW 08/02/2021 2134   APPEARANCEUR CLEAR 08/02/2021 2134   LABSPEC 1.013 08/02/2021 2134   PHURINE 7.0 08/02/2021 2134   GLUCOSEU NEGATIVE 08/02/2021 2134   HGBUR LARGE (A) 08/02/2021 2134   BILIRUBINUR NEGATIVE 08/02/2021 2134   KETONESUR NEGATIVE 08/02/2021 2134   PROTEINUR NEGATIVE 08/02/2021 2134   NITRITE NEGATIVE 08/02/2021 2134   LEUKOCYTESUR SMALL (A) 08/02/2021 2134   Sepsis Labs: _0 (procalcitonin:4,lacticidven:4) ) Recent Results (from the past 240 hour(s))  Urine Culture     Status: Abnormal (Preliminary result)   Collection Time: 08/02/21  9:34 PM   Specimen: In/Out Cath Urine  Result Value Ref Range Status   Specimen Description   Final    IN/OUT CATH URINE Performed at [facility], [address] [place], [place] [address]    Special Requests   Final    NONE Performed at [facility], [address], [place], [place] [address]    Culture (A)  Final    20,000 COLONIES/mL ESCHERICHIA COLI CULTURE REINCUBATED FOR BETTER GROWTH SUSCEPTIBILITIES TO  FOLLOW Performed at [facility], [place] [address] [place], [place] [address]    Report Status PENDING  Incomplete  Blood Culture (routine x 2)     Status: None (Preliminary result)   Collection Time: 08/02/21  9:55 PM   Specimen: Right Antecubital; Blood  Result Value Ref Range Status   Specimen Description   Final    RIGHT ANTECUBITAL BOTTLES DRAWN AEROBIC AND ANAEROBIC   Special Requests Blood Culture adequate volume  Final   Culture   Final    NO GROWTH 2 DAYS Performed at [facility], [address] [place], [place] [address]    Report Status PENDING  Incomplete  Blood Culture (routine x 2)     Status:  None (Preliminary result)   Collection Time: 08/02/21 10:01 PM   Specimen: BLOOD RIGHT HAND  Result Value Ref Range Status   Specimen Description   Final    BLOOD RIGHT HAND BOTTLES DRAWN AEROBIC AND ANAEROBIC   Special Requests Blood Culture adequate volume  Final   Culture   Final    NO GROWTH 2 DAYS Performed at [facility], [address], [place], [place] [address]    Report Status PENDING  Incomplete  Resp Panel by RT-PCR (Flu A&B, Covid) Nasopharyngeal Swab     Status: None   Collection Time: 08/02/21 10:30 PM   Specimen: Nasopharyngeal Swab; Nasopharyngeal(NP) swabs in vial transport medium  Result Value Ref Range Status   SARS Coronavirus 2 by RT PCR NEGATIVE NEGATIVE Final    Comment: (NOTE) SARS-CoV-2 target nucleic acids are NOT DETECTED.  The SARS-CoV-2 RNA is generally detectable in upper respiratory specimens during the acute phase of infection. The lowest concentration of SARS-CoV-2 viral copies this assay can detect is 138 copies/mL. A negative result does not preclude SARS-Cov-2 infection and should not be used as the sole basis for treatment or other patient management decisions. A negative result may occur with  improper specimen collection/handling, submission of specimen other than nasopharyngeal swab, presence of viral mutation(s) within  the areas targeted by this assay, and inadequate number of viral copies(<138 copies/mL). A negative result must be combined with clinical observations, patient history, and epidemiological information. The expected result is Negative.  Fact Sheet for Patients:  EntrepreneurPulse.com.au  Fact Sheet for Healthcare Providers:  IncredibleEmployment.be  This test is no t yet approved or cleared by the [place] FDA and  has been authorized for detection and/or diagnosis of SARS-CoV-2 by FDA under an Emergency Use Authorization (EUA). This EUA will remain  in effect (meaning this test can be used) for the duration of the COVID-19 declaration under Section 564(b)(1) of the Act, 21 U.S.C.section 360bbb-3(b)(1), unless the authorization is terminated  or revoked sooner.       Influenza A by PCR NEGATIVE NEGATIVE Final   Influenza B by PCR NEGATIVE NEGATIVE Final    Comment: (NOTE) The Xpert Xpress SARS-CoV-2/FLU/RSV plus assay is intended as an aid in the diagnosis of influenza from Nasopharyngeal swab specimens and should not be used as a sole basis for treatment. Nasal washings and aspirates are unacceptable for Xpert Xpress SARS-CoV-2/FLU/RSV testing.  Fact Sheet for Patients: EntrepreneurPulse.com.au  Fact Sheet for Healthcare Providers: IncredibleEmployment.be  This test is not yet approved or cleared by the [place] FDA and has been authorized for detection and/or diagnosis of SARS-CoV-2 by FDA under an Emergency Use Authorization (EUA). This EUA will remain in effect (meaning this test can be used) for the duration of the COVID-19 declaration under Section 564(b)(1) of the Act, 21 U.S.C. section 360bbb-3(b)(1), unless the authorization is terminated or revoked.  Performed at [facility], [address] [place], [place] [address]   CSF culture w Gram Stain     Status: None (Preliminary result)    Collection Time: 08/03/21 12:37 AM   Specimen: CSF; Cerebrospinal Fluid  Result Value Ref Range Status   Specimen Description   Final    CSF Performed at [facility], [address], [place], [place] [address]    Special Requests   Final    NONE Performed at [facility], [address], [place], [place] [address]    Gram Stain   Final    NO WBC SEEN NO ORGANISMS  SEEN GRAM STAIN REVIEWED-AGREE WITH RESULT    Culture   Final    NO GROWTH 1 DAY Performed at [facility], [place] [address], [place], [place] [address]    Report Status PENDING  Incomplete  Group A Strep by PCR     Status: None   Collection Time: 08/03/21  7:36 AM   Specimen: Throat; Sterile Swab  Result Value Ref Range Status   Group A Strep by PCR NOT DETECTED NOT DETECTED Final    Comment: Performed at [facility], [address] [place], [place] [address]  MRSA Next Gen by PCR, Nasal     Status: None   Collection Time: 08/03/21  7:43 AM   Specimen: Nasal Mucosa; Nasal Swab  Result Value Ref Range Status   MRSA by PCR Next Gen NOT DETECTED NOT DETECTED Final    Comment: (NOTE) The GeneXpert MRSA Assay (FDA approved for NASAL specimens only), is one component of a comprehensive MRSA colonization surveillance program. It is not intended to diagnose MRSA infection nor to guide or monitor treatment for MRSA infections. Test performance is not FDA approved in patients less than 44 years old. Performed at [facility], [address] [place], [place] [address]   Respiratory (~20 pathogens) panel by PCR     Status: Abnormal   Collection Time: 08/03/21  5:33 PM   Specimen: Nasopharyngeal Swab; Respiratory  Result Value Ref Range Status   Adenovirus NOT DETECTED NOT DETECTED Final   Coronavirus 229E NOT DETECTED NOT DETECTED Final    Comment: (NOTE) The Coronavirus on the Respiratory Panel, DOES NOT test for the novel  Coronavirus (2019 nCoV)    Coronavirus HKU1 NOT DETECTED NOT DETECTED Final    Coronavirus NL63 NOT DETECTED NOT DETECTED Final   Coronavirus OC43 NOT DETECTED NOT DETECTED Final   Metapneumovirus NOT DETECTED NOT DETECTED Final   Rhinovirus / Enterovirus NOT DETECTED NOT DETECTED Final   Influenza A NOT DETECTED NOT DETECTED Final   Influenza B NOT DETECTED NOT DETECTED Final   Parainfluenza Virus 1 NOT DETECTED NOT DETECTED Final   Parainfluenza Virus 2 NOT DETECTED NOT DETECTED Final   Parainfluenza Virus 3 DETECTED (A) NOT DETECTED Final   Parainfluenza Virus 4 NOT DETECTED NOT DETECTED Final   Respiratory Syncytial Virus NOT DETECTED NOT DETECTED Final   Bordetella pertussis NOT DETECTED NOT DETECTED Final   Bordetella Parapertussis NOT DETECTED NOT DETECTED Final   Chlamydophila pneumoniae NOT DETECTED NOT DETECTED Final   Mycoplasma pneumoniae NOT DETECTED NOT DETECTED Final    Comment: Performed at [facility], [address], [place], [place] [address]     Scheduled Meds:  budesonide (PULMICORT) nebulizer solution  0.5 mg Nebulization BID   dextromethorphan-guaiFENesin  1 tablet Oral BID   enoxaparin (LOVENOX) injection  40 mg Subcutaneous Q24H   ipratropium-albuterol  3 mL Nebulization Q6H WA   levothyroxine  88 mcg Oral QAC breakfast   metoprolol succinate  25 mg Oral Daily   Continuous Infusions:  azithromycin Stopped (08/03/21 2206)   ceFEPime (MAXIPIME) IV     lactated ringers 100 mL/hr at 08/03/21 1856    Procedures/Studies: CT Head Wo Contrast  Result Date: 08/03/2021 CLINICAL DATA:  Meningitis/CNS infection suspected Patient reports generalized weakness. EXAM: CT HEAD WITHOUT CONTRAST TECHNIQUE: Contiguous axial images were obtained from the base of the skull through the vertex without intravenous contrast. RADIATION DOSE REDUCTION: This exam was performed according to the departmental dose-optimization program which includes automated exposure  control, adjustment of the mA and/or kV according to patient size and/or use of iterative  reconstruction technique. COMPARISON:  None Available. FINDINGS: Brain: Normal for age atrophy. No intracranial hemorrhage, mass effect, or midline shift. No hydrocephalus. The basilar cisterns are patent. There is mild periventricular and deep white matter hypodensity, nonspecific but typically chronic small vessel ischemia. No evidence of territorial infarct or acute ischemia. No extra-axial or intracranial fluid collection. Vascular: Atherosclerosis of skullbase vasculature without hyperdense vessel or abnormal calcification. Skull: No fracture or focal lesion. Sinuses/Orbits: Paranasal sinuses and mastoid air cells are clear. The visualized orbits are unremarkable. Other: None. IMPRESSION: 1. No acute intracranial abnormality. 2. Mild age-related atrophy and chronic small vessel ischemia. Electronically Signed   By: Keith Rake M.D.   On: 08/03/2021 01:08   CT CHEST ABDOMEN PELVIS W CONTRAST  Result Date: 08/03/2021 CLINICAL DATA:  71 year old female with history of abdominal pain, fever and leukocytosis. Generalized weakness. EXAM: CT CHEST, ABDOMEN, AND PELVIS WITH CONTRAST TECHNIQUE: Multidetector CT imaging of the chest, abdomen and pelvis was performed following the standard protocol during bolus administration of intravenous contrast. RADIATION DOSE REDUCTION: This exam was performed according to the departmental dose-optimization program which includes automated exposure control, adjustment of the mA and/or kV according to patient size and/or use of iterative reconstruction technique. CONTRAST:  155m OMNIPAQUE IOHEXOL 300 MG/ML  SOLN COMPARISON:  Low-dose lung cancer screening chest CT 10/04/2020. CT the abdomen and pelvis 11/18/2020. FINDINGS: CT CHEST FINDINGS Cardiovascular: Heart size is normal. There is no significant pericardial fluid, thickening or pericardial calcification. There is aortic atherosclerosis, as well as atherosclerosis of the great vessels of the mediastinum and the  coronary arteries, including calcified atherosclerotic plaque in the left main, left anterior descending and left circumflex coronary arteries. Mediastinum/Nodes: No pathologically enlarged mediastinal or hilar lymph nodes. Esophagus is unremarkable in appearance. No axillary lymphadenopathy. Lungs/Pleura: No acute consolidative airspace disease. No pleural effusions. No suspicious appearing pulmonary nodules or masses are noted. Several tiny 1-3 mm pulmonary nodules are scattered throughout the periphery of the lungs, stable compared to prior studies (likely benign). Focal area of mild cylindrical bronchiectasis with thickening of the peribronchovascular interstitium and regional linear architectural distortion in the left upper lobe, stable compared to the prior study, most compatible with an area of chronic post infectious or inflammatory scarring. Diffuse bronchial wall thickening, similar to the prior study, likely reflective of chronic bronchitis. Musculoskeletal: There are no aggressive appearing lytic or blastic lesions noted in the visualized portions of the skeleton. CT ABDOMEN PELVIS FINDINGS Hepatobiliary: No suspicious cystic or solid hepatic lesions. No intra or extrahepatic biliary ductal dilatation. Several calcified gallstones lie dependently in the gallbladder. Gallbladder is not distended. Gallbladder wall thickness is normal. No pericholecystic fluid or surrounding inflammatory changes to suggest an acute cholecystitis at this time. Pancreas: No pancreatic mass. No pancreatic ductal dilatation. No pancreatic or peripancreatic fluid collections or inflammatory changes. Spleen: Unremarkable. Adrenals/Urinary Tract: Mild cortical thinning in the kidneys bilaterally. No suspicious renal lesions. Mild bilateral hydroureteronephrosis. Urinary bladder is moderately distended. Bilateral adrenal glands are normal in appearance. Stomach/Bowel: Mural thickening in the antral pre-pyloric region of the  stomach. No pathologic dilatation of small bowel or colon. Normal appendix. Vascular/Lymphatic: Aortic atherosclerosis, without evidence of aneurysm or dissection in the abdominal or pelvic vasculature. No lymphadenopathy noted in the abdomen or pelvis. Reproductive: Status post hysterectomy. Ovaries are not confidently identified may be surgically absent or atrophic. Other: No significant volume of ascites.  No pneumoperitoneum.  Musculoskeletal: 7 mm of anterolisthesis of L4 upon L5. There are no aggressive appearing lytic or blastic lesions noted in the visualized portions of the skeleton. IMPRESSION: 1. Moderate distension of the urinary bladder which is associated with mild bilateral hydroureteronephrosis. Clinical correlation for signs and symptoms of bladder outlet obstruction or neurogenic bladder is recommended. 2. Cholelithiasis without evidence of acute cholecystitis. 3. No acute findings in the thorax. 4. Aortic atherosclerosis, in addition to left main and three-vessel coronary artery disease. Assessment for potential risk factor modification, dietary therapy or pharmacologic therapy may be warranted, if clinically indicated. 5. Additional incidental findings, as above. Electronically Signed   By: Vinnie Langton M.D.   On: 08/03/2021 08:42   DG Chest Port 1 View  Result Date: 08/02/2021 CLINICAL DATA:  Questionable sepsis. EXAM: PORTABLE CHEST 1 VIEW COMPARISON:  Chest x-ray 09/01/2011 FINDINGS: The heart size and mediastinal contours are within normal limits. Both lungs are clear. The visualized skeletal structures are unremarkable. IMPRESSION: No active disease. Electronically Signed   By: Ronney Asters M.D.   On: 08/02/2021 22:30    Orson Eva, DO  [facility]  If 7PM-7AM, please contact night-coverage [email or web address] Password [email or web address] 08/04/2021, 2:19 PM   LOS: 1 day

## 2021-08-04 NOTE — Discharge Summary (Incomplete)
Physician Discharge Summary   Patient: Emily Valentine MRN: [id number] DOB: 1950-09-26  Admit date:     08/02/2021  Discharge date: 08/05/2021  Discharge Physician: Shanon Brow Lacey Dotson   PCP: Sharilyn Sites, MD   Recommendations at discharge:   Please follow up with primary care provider within 1-2 weeks  Please repeat BMP and CBC in one week      Hospital Course: 71 year old female with history of rheumatoid arthritis, COPD, hypertension, hypothyroidism, IBS-C presenting with generalized weakness and diffuse myalgias and arthralgias.  The patient has been feeling bad for about a week with a cough and some shortness of breath and dyspnea on exertion.  She denies any nausea or vomiting.  She has had some loose stools but denies any frank diarrhea.  She has had some headaches, but denies any visual disturbance or focal extremity weakness.  She denies any chest pain, hemoptysis, dysuria or hematuria.  There is no hematochezia or melena.  She has upper abdominal discomfort which she feels may be related to her coughing.  She went to see her PCP 2 days prior to this admission and was placed on Tessalon Perles, prednisone, and amoxicillin.  She did not improve.  She continued to have increasing myalgias and generalized weakness. In the ED, the patient was febrile up to 101.6 F.  She was tachycardic into 110s.  He was hemodynamically stable with oxygen saturation 93-95% on room air.  Lumbar puncture was performed and showed WBC 2, RBC 1, protein 26, glucose 78.  UA was negative for pyuria.  Lactic acid 2.7>> 1.6.  CT of the brain was negative.  The patient was started on ceftriaxone, azithromycin, and lactated Ringer's.  Assessment and Plan: * Sepsis ([place]) Source of infection = pneumonia Lumbar puncture--not suggestive of CNS infection Follow blood cultures--neg to date UA negative for pyuria Personally reviewed chest x-ray--no infiltrates CT chest--cylindrical bronchiectasis with peribronchovascular  interstitial thickening; diffuse bronchial wall thickening CT abd/pelvs-mural thickening of prepyloric stomach Check PCT 0.42>>0.51 ESR--40 Check complements--pending Check CRP--10.7 Stool for C. Difficile--no further diarrhea Continue empiric ceftriaxone and azithromycin  Pneumonia due to parainfluenza virus Viral respiratory panel +parainfluenza 3 Patient has superimposed bacterial pneumonia --suspect atypical organism --continue ceftriaxone and azithro --follow blood cultures--neg to date --d/c home with azithromycin x 2 more days  COPD (chronic obstructive pulmonary disease) ([place]) Start bronchodilators>>continue duonebs and pulmicort Stable on room air Nebulizer machine ordered for home  Hypothyroidism Continue levothyroxine  Hypomagnesemia Repleted  Hypokalemia Repleted  Essential hypertension Holding losartan/HCTZ secondary to soft blood pressure Restart metoprolol succinate Restart losartan      {Tip this will not be part of the note when signed Body mass index is 24.68 kg/m. , ,  (Optional):26781}  {(NOTE) Pain control PDMP Statment (Optional):26782} Consultants: none Procedures performed: none  Disposition: Home Diet recommendation:  Cardiac diet DISCHARGE MEDICATION: Allergies as of 08/04/2021       Reactions   Codeine Nausea Only   Oxycontin [oxycodone] Nausea And Vomiting     Med Rec must be completed prior to using this [facility]***        Durable Medical Equipment  (From admission, onward)           Start     Ordered   08/04/21 1431  For home use only DME Nebulizer machine  Once       Question Answer Comment  Patient needs a nebulizer to treat with the following condition COPD (chronic obstructive pulmonary disease) ([place])   Length of Need 6  Months      08/04/21 1430            Discharge Exam: Filed Weights   08/02/21 2101  Weight: 61.2 kg   HEENT:  [place]/AT, No thrush, no icterus CV:  RRR, no rub, no S3, no S4 Lung:   bibasilar rales.  No wheeze Abd:  soft/+BS, NT Ext:  No edema, no lymphangitis, no synovitis, no rash   Condition at discharge: stable  The results of significant diagnostics from this hospitalization (including imaging, microbiology, ancillary and laboratory) are listed below for reference.   Imaging Studies: CT Head Wo Contrast  Result Date: 08/03/2021 CLINICAL DATA:  Meningitis/CNS infection suspected Patient reports generalized weakness. EXAM: CT HEAD WITHOUT CONTRAST TECHNIQUE: Contiguous axial images were obtained from the base of the skull through the vertex without intravenous contrast. RADIATION DOSE REDUCTION: This exam was performed according to the departmental dose-optimization program which includes automated exposure control, adjustment of the mA and/or kV according to patient size and/or use of iterative reconstruction technique. COMPARISON:  None Available. FINDINGS: Brain: Normal for age atrophy. No intracranial hemorrhage, mass effect, or midline shift. No hydrocephalus. The basilar cisterns are patent. There is mild periventricular and deep white matter hypodensity, nonspecific but typically chronic small vessel ischemia. No evidence of territorial infarct or acute ischemia. No extra-axial or intracranial fluid collection. Vascular: Atherosclerosis of skullbase vasculature without hyperdense vessel or abnormal calcification. Skull: No fracture or focal lesion. Sinuses/Orbits: Paranasal sinuses and mastoid air cells are clear. The visualized orbits are unremarkable. Other: None. IMPRESSION: 1. No acute intracranial abnormality. 2. Mild age-related atrophy and chronic small vessel ischemia. Electronically Signed   By: Keith Rake M.D.   On: 08/03/2021 01:08   CT CHEST ABDOMEN PELVIS W CONTRAST  Result Date: 08/03/2021 CLINICAL DATA:  71 year old female with history of abdominal pain, fever and leukocytosis. Generalized weakness. EXAM: CT CHEST, ABDOMEN, AND PELVIS WITH  CONTRAST TECHNIQUE: Multidetector CT imaging of the chest, abdomen and pelvis was performed following the standard protocol during bolus administration of intravenous contrast. RADIATION DOSE REDUCTION: This exam was performed according to the departmental dose-optimization program which includes automated exposure control, adjustment of the mA and/or kV according to patient size and/or use of iterative reconstruction technique. CONTRAST:  169m OMNIPAQUE IOHEXOL 300 MG/ML  SOLN COMPARISON:  Low-dose lung cancer screening chest CT 10/04/2020. CT the abdomen and pelvis 11/18/2020. FINDINGS: CT CHEST FINDINGS Cardiovascular: Heart size is normal. There is no significant pericardial fluid, thickening or pericardial calcification. There is aortic atherosclerosis, as well as atherosclerosis of the great vessels of the mediastinum and the coronary arteries, including calcified atherosclerotic plaque in the left main, left anterior descending and left circumflex coronary arteries. Mediastinum/Nodes: No pathologically enlarged mediastinal or hilar lymph nodes. Esophagus is unremarkable in appearance. No axillary lymphadenopathy. Lungs/Pleura: No acute consolidative airspace disease. No pleural effusions. No suspicious appearing pulmonary nodules or masses are noted. Several tiny 1-3 mm pulmonary nodules are scattered throughout the periphery of the lungs, stable compared to prior studies (likely benign). Focal area of mild cylindrical bronchiectasis with thickening of the peribronchovascular interstitium and regional linear architectural distortion in the left upper lobe, stable compared to the prior study, most compatible with an area of chronic post infectious or inflammatory scarring. Diffuse bronchial wall thickening, similar to the prior study, likely reflective of chronic bronchitis. Musculoskeletal: There are no aggressive appearing lytic or blastic lesions noted in the visualized portions of the skeleton. CT ABDOMEN  PELVIS FINDINGS Hepatobiliary: No suspicious cystic  or solid hepatic lesions. No intra or extrahepatic biliary ductal dilatation. Several calcified gallstones lie dependently in the gallbladder. Gallbladder is not distended. Gallbladder wall thickness is normal. No pericholecystic fluid or surrounding inflammatory changes to suggest an acute cholecystitis at this time. Pancreas: No pancreatic mass. No pancreatic ductal dilatation. No pancreatic or peripancreatic fluid collections or inflammatory changes. Spleen: Unremarkable. Adrenals/Urinary Tract: Mild cortical thinning in the kidneys bilaterally. No suspicious renal lesions. Mild bilateral hydroureteronephrosis. Urinary bladder is moderately distended. Bilateral adrenal glands are normal in appearance. Stomach/Bowel: Mural thickening in the antral pre-pyloric region of the stomach. No pathologic dilatation of small bowel or colon. Normal appendix. Vascular/Lymphatic: Aortic atherosclerosis, without evidence of aneurysm or dissection in the abdominal or pelvic vasculature. No lymphadenopathy noted in the abdomen or pelvis. Reproductive: Status post hysterectomy. Ovaries are not confidently identified may be surgically absent or atrophic. Other: No significant volume of ascites.  No pneumoperitoneum. Musculoskeletal: 7 mm of anterolisthesis of L4 upon L5. There are no aggressive appearing lytic or blastic lesions noted in the visualized portions of the skeleton. IMPRESSION: 1. Moderate distension of the urinary bladder which is associated with mild bilateral hydroureteronephrosis. Clinical correlation for signs and symptoms of bladder outlet obstruction or neurogenic bladder is recommended. 2. Cholelithiasis without evidence of acute cholecystitis. 3. No acute findings in the thorax. 4. Aortic atherosclerosis, in addition to left main and three-vessel coronary artery disease. Assessment for potential risk factor modification, dietary therapy or pharmacologic  therapy may be warranted, if clinically indicated. 5. Additional incidental findings, as above. Electronically Signed   By: Vinnie Langton M.D.   On: 08/03/2021 08:42   DG Chest Port 1 View  Result Date: 08/02/2021 CLINICAL DATA:  Questionable sepsis. EXAM: PORTABLE CHEST 1 VIEW COMPARISON:  Chest x-ray 09/01/2011 FINDINGS: The heart size and mediastinal contours are within normal limits. Both lungs are clear. The visualized skeletal structures are unremarkable. IMPRESSION: No active disease. Electronically Signed   By: Ronney Asters M.D.   On: 08/02/2021 22:30    Microbiology: Results for orders placed or performed during the hospital encounter of 08/02/21  Urine Culture     Status: Abnormal (Preliminary result)   Collection Time: 08/02/21  9:34 PM   Specimen: In/Out Cath Urine  Result Value Ref Range Status   Specimen Description   Final    IN/OUT CATH URINE Performed at [facility], [address] [place], [place] [address]    Special Requests   Final    NONE Performed at [facility], [address], [place], [place] [address]    Culture (A)  Final    20,000 COLONIES/mL ESCHERICHIA COLI CULTURE REINCUBATED FOR BETTER GROWTH SUSCEPTIBILITIES TO FOLLOW Performed at [facility], [place] [address] [place], [place] [address]    Report Status PENDING  Incomplete  Blood Culture (routine x 2)     Status: None (Preliminary result)   Collection Time: 08/02/21  9:55 PM   Specimen: Right Antecubital; Blood  Result Value Ref Range Status   Specimen Description   Final    RIGHT ANTECUBITAL BOTTLES DRAWN AEROBIC AND ANAEROBIC   Special Requests Blood Culture adequate volume  Final   Culture   Final    NO GROWTH 2 DAYS Performed at [facility], [address] [place],  [address]    Report Status PENDING  Incomplete  Blood Culture (routine x 2)     Status: None (Preliminary result)   Collection Time: 08/02/21 10:01 PM   Specimen: BLOOD RIGHT  HAND  Result Value Ref Range  Status   Specimen Description   Final    BLOOD RIGHT HAND BOTTLES DRAWN AEROBIC AND ANAEROBIC   Special Requests Blood Culture adequate volume  Final   Culture   Final    NO GROWTH 2 DAYS Performed at [facility], [address], [place], [place] [address]    Report Status PENDING  Incomplete  Resp Panel by RT-PCR (Flu A&B, Covid) Nasopharyngeal Swab     Status: None   Collection Time: 08/02/21 10:30 PM   Specimen: Nasopharyngeal Swab; Nasopharyngeal(NP) swabs in vial transport medium  Result Value Ref Range Status   SARS Coronavirus 2 by RT PCR NEGATIVE NEGATIVE Final    Comment: (NOTE) SARS-CoV-2 target nucleic acids are NOT DETECTED.  The SARS-CoV-2 RNA is generally detectable in upper respiratory specimens during the acute phase of infection. The lowest concentration of SARS-CoV-2 viral copies this assay can detect is 138 copies/mL. A negative result does not preclude SARS-Cov-2 infection and should not be used as the sole basis for treatment or other patient management decisions. A negative result may occur with  improper specimen collection/handling, submission of specimen other than nasopharyngeal swab, presence of viral mutation(s) within the areas targeted by this assay, and inadequate number of viral copies(<138 copies/mL). A negative result must be combined with clinical observations, patient history, and epidemiological information. The expected result is Negative.  Fact Sheet for Patients:  EntrepreneurPulse.com.au  Fact Sheet for Healthcare Providers:  IncredibleEmployment.be  This test is no t yet approved or cleared by the [place] FDA and  has been authorized for detection and/or diagnosis of SARS-CoV-2 by FDA under an Emergency Use Authorization (EUA). This EUA will remain  in effect (meaning this test can be used) for the duration of the COVID-19 declaration under Section 564(b)(1) of the Act, 21 U.S.C.section  360bbb-3(b)(1), unless the authorization is terminated  or revoked sooner.       Influenza A by PCR NEGATIVE NEGATIVE Final   Influenza B by PCR NEGATIVE NEGATIVE Final    Comment: (NOTE) The Xpert Xpress SARS-CoV-2/FLU/RSV plus assay is intended as an aid in the diagnosis of influenza from Nasopharyngeal swab specimens and should not be used as a sole basis for treatment. Nasal washings and aspirates are unacceptable for Xpert Xpress SARS-CoV-2/FLU/RSV testing.  Fact Sheet for Patients: EntrepreneurPulse.com.au  Fact Sheet for Healthcare Providers: IncredibleEmployment.be  This test is not yet approved or cleared by the [place] FDA and has been authorized for detection and/or diagnosis of SARS-CoV-2 by FDA under an Emergency Use Authorization (EUA). This EUA will remain in effect (meaning this test can be used) for the duration of the COVID-19 declaration under Section 564(b)(1) of the Act, 21 U.S.C. section 360bbb-3(b)(1), unless the authorization is terminated or revoked.  Performed at [facility], [address], [place], [place] [address]   CSF culture w Gram Stain     Status: None (Preliminary result)   Collection Time: 08/03/21 12:37 AM   Specimen: CSF; Cerebrospinal Fluid  Result Value Ref Range Status   Specimen Description   Final    CSF Performed at [facility], [address] [place], [place] [address]    Special Requests   Final    NONE Performed at [facility], [address] [place], [place] [address]    Gram Stain   Final    NO WBC SEEN NO ORGANISMS SEEN GRAM STAIN REVIEWED-AGREE WITH RESULT    Culture   Final  NO GROWTH 1 DAY Performed at [facility], [place] [address] [place], [place] [address]    Report Status PENDING  Incomplete  Group A Strep by PCR     Status: None   Collection Time: 08/03/21  7:36 AM   Specimen: Throat; Sterile Swab  Result Value Ref Range Status   Group A Strep by PCR NOT  DETECTED NOT DETECTED Final    Comment: Performed at [facility], [address] [place], [place] [address]  MRSA Next Gen by PCR, Nasal     Status: None   Collection Time: 08/03/21  7:43 AM   Specimen: Nasal Mucosa; Nasal Swab  Result Value Ref Range Status   MRSA by PCR Next Gen NOT DETECTED NOT DETECTED Final    Comment: (NOTE) The GeneXpert MRSA Assay (FDA approved for NASAL specimens only), is one component of a comprehensive MRSA colonization surveillance program. It is not intended to diagnose MRSA infection nor to guide or monitor treatment for MRSA infections. Test performance is not FDA approved in patients less than 74 years old. Performed at [facility], [address] [place], [place] [address]   Respiratory (~20 pathogens) panel by PCR     Status: Abnormal   Collection Time: 08/03/21  5:33 PM   Specimen: Nasopharyngeal Swab; Respiratory  Result Value Ref Range Status   Adenovirus NOT DETECTED NOT DETECTED Final   Coronavirus 229E NOT DETECTED NOT DETECTED Final    Comment: (NOTE) The Coronavirus on the Respiratory Panel, DOES NOT test for the novel  Coronavirus (2019 nCoV)    Coronavirus HKU1 NOT DETECTED NOT DETECTED Final   Coronavirus NL63 NOT DETECTED NOT DETECTED Final   Coronavirus OC43 NOT DETECTED NOT DETECTED Final   Metapneumovirus NOT DETECTED NOT DETECTED Final   Rhinovirus / Enterovirus NOT DETECTED NOT DETECTED Final   Influenza A NOT DETECTED NOT DETECTED Final   Influenza B NOT DETECTED NOT DETECTED Final   Parainfluenza Virus 1 NOT DETECTED NOT DETECTED Final   Parainfluenza Virus 2 NOT DETECTED NOT DETECTED Final   Parainfluenza Virus 3 DETECTED (A) NOT DETECTED Final   Parainfluenza Virus 4 NOT DETECTED NOT DETECTED Final   Respiratory Syncytial Virus NOT DETECTED NOT DETECTED Final   Bordetella pertussis NOT DETECTED NOT DETECTED Final   Bordetella Parapertussis NOT DETECTED NOT DETECTED Final   Chlamydophila pneumoniae NOT DETECTED NOT  DETECTED Final   Mycoplasma pneumoniae NOT DETECTED NOT DETECTED Final    Comment: Performed at [facility], [address], [place], [place] [address]    Labs: CBC: Recent Labs  Lab 08/02/21 2154 08/03/21 0356 08/04/21 0244  WBC 14.3* 15.3* 10.3  NEUTROABS 12.0*  --  6.0  HGB 12.5 11.9* 10.1*  HCT 35.7* 34.0* 29.8*  MCV 91.1 92.4 93.1  PLT 227 219 353   Basic Metabolic Panel: Recent Labs  Lab 08/02/21 2154 08/02/21 2354 08/03/21 0356 08/04/21 0244  NA 137  --  141 138  K 3.3*  --  3.6 3.4*  CL 104  --  106 108  CO2 26  --  29 25  GLUCOSE 164*  --  109* 101*  BUN 32*  --  23 17  CREATININE 1.09*  --  1.06* 1.05*  CALCIUM 9.0  --  8.9 8.1*  MG  --  1.4*  --  2.1  PHOS  --   --  3.3  --    Liver Function Tests: Recent Labs  Lab 08/02/21 2154 08/03/21 0356  AST 27 25  ALT 19 17  ALKPHOS 90 78  BILITOT 1.1 0.7  PROT 7.7 6.8  ALBUMIN 3.5 3.0*   CBG: No results for input(s): GLUCAP in the last 168 hours.  Discharge time spent: greater than 30 minutes.  Signed: Orson Eva, MD [facility] 08/04/2021

## 2021-08-05 ENCOUNTER — Ambulatory Visit: Payer: Medicare PPO | Admitting: Internal Medicine

## 2021-08-05 ENCOUNTER — Encounter: Payer: Self-pay | Admitting: Internal Medicine

## 2021-08-05 DIAGNOSIS — J441 Chronic obstructive pulmonary disease with (acute) exacerbation: Secondary | ICD-10-CM | POA: Diagnosis not present

## 2021-08-05 DIAGNOSIS — A419 Sepsis, unspecified organism: Secondary | ICD-10-CM | POA: Diagnosis not present

## 2021-08-05 DIAGNOSIS — J449 Chronic obstructive pulmonary disease, unspecified: Secondary | ICD-10-CM | POA: Diagnosis not present

## 2021-08-05 DIAGNOSIS — J122 Parainfluenza virus pneumonia: Secondary | ICD-10-CM | POA: Diagnosis not present

## 2021-08-05 LAB — GASTROINTESTINAL PANEL BY PCR, STOOL (REPLACES STOOL CULTURE)

## 2021-08-05 LAB — PROCALCITONIN: Procalcitonin: 0.29 ng/mL

## 2021-08-05 LAB — BASIC METABOLIC PANEL
Anion gap: 6 (ref 5–15)
BUN: 18 mg/dL (ref 8–23)
CO2: 24 mmol/L (ref 22–32)
Calcium: 8.7 mg/dL — ABNORMAL LOW (ref 8.9–10.3)
Chloride: 110 mmol/L (ref 98–111)
Creatinine, Ser: 1.18 mg/dL — ABNORMAL HIGH (ref 0.44–1.00)
GFR, Estimated: 49 mL/min — ABNORMAL LOW (ref >60)
Glucose, Bld: 121 mg/dL — ABNORMAL HIGH (ref 70–99)
Potassium: 3.5 mmol/L (ref 3.5–5.1)
Sodium: 140 mmol/L (ref 135–145)

## 2021-08-05 LAB — LEGIONELLA PNEUMOPHILA SEROGP 1 UR AG: L. pneumophila Serogp 1 Ur Ag: NEGATIVE

## 2021-08-05 LAB — MAGNESIUM: Magnesium: 2 mg/dL (ref 1.7–2.4)

## 2021-08-05 MED ORDER — PREDNISONE 50 MG PO TABS: 50.0000 mg | ORAL_TABLET | Freq: Every day | ORAL | 0 refills | Status: DC

## 2021-08-05 MED ORDER — METHYLPREDNISOLONE SODIUM SUCC 125 MG IJ SOLR
125.0000 mg | Freq: Once | INTRAMUSCULAR | Status: AC
Start: 2021-08-05 — End: 2021-08-05
  Administered 2021-08-05: 125 mg via INTRAVENOUS
  Filled 2021-08-05: qty 2

## 2021-08-05 MED ORDER — AZITHROMYCIN 500 MG PO TABS: 500.0000 mg | ORAL_TABLET | Freq: Every day | ORAL | 0 refills | Status: DC

## 2021-08-05 MED ORDER — PREDNISONE 20 MG PO TABS: 50.0000 mg | ORAL_TABLET | Freq: Every day | ORAL | Status: DC

## 2021-08-05 NOTE — TOC Transition Note (Signed)
Transition of Care [facility]) - CM/SW Discharge Note   Patient Details  Name: Emily Valentine MRN: [id number] Date of Birth: April 24, 1950  Transition of Care [facility]) CM/SW Contact:  Shade Flood, LCSW Phone Number: 08/05/2021, 10:45 AM   Clinical Narrative:     Pt stable for dc per MD. Home nebulizer machine and medications ordered through [facility] and will be delivered to pt's home. No other TOC needs for dc.  Final next level of care: Home/Self Care Barriers to Discharge: Barriers Resolved   Patient Goals and CMS Choice Patient states their goals for this hospitalization and ongoing recovery are:: go home CMS Medicare.gov Compare Post Acute Care list provided to:: Patient Choice offered to / list presented to : Patient  Discharge Placement                       Discharge Plan and Services                DME Arranged: Nebulizer machine, Nebulizer/meds DME Agency: [facility] Date DME Agency Contacted: 08/04/21   Representative spoke with at DME Agency: Caryl Pina            Social Determinants of Health ([place]) Interventions     Readmission Risk Interventions     View : No data to display.

## 2021-08-06 LAB — URINE CULTURE: Culture: 20000 — AB

## 2021-08-06 LAB — CSF CULTURE W GRAM STAIN
Culture: NO GROWTH
Gram Stain: NONE SEEN

## 2021-08-07 LAB — COMPLEMENT, TOTAL: Compl, Total (CH50): 55 U/mL (ref >41)

## 2021-08-07 LAB — C3 COMPLEMENT: C3 Complement: 105 mg/dL (ref 82–167)

## 2021-08-07 LAB — CULTURE, BLOOD (ROUTINE X 2)
Culture: NO GROWTH
Culture: NO GROWTH
Special Requests: ADEQUATE
Special Requests: ADEQUATE

## 2021-08-07 LAB — C4 COMPLEMENT: Complement C4, Body Fluid: 13 mg/dL (ref 12–38)

## 2021-08-12 DIAGNOSIS — J449 Chronic obstructive pulmonary disease, unspecified: Secondary | ICD-10-CM | POA: Diagnosis not present

## 2021-08-12 DIAGNOSIS — Z6824 Body mass index (BMI) 24.0-24.9, adult: Secondary | ICD-10-CM | POA: Diagnosis not present

## 2021-08-12 DIAGNOSIS — M069 Rheumatoid arthritis, unspecified: Secondary | ICD-10-CM | POA: Diagnosis not present

## 2021-08-12 DIAGNOSIS — J189 Pneumonia, unspecified organism: Secondary | ICD-10-CM | POA: Diagnosis not present

## 2021-08-12 DIAGNOSIS — N189 Chronic kidney disease, unspecified: Secondary | ICD-10-CM | POA: Diagnosis not present

## 2021-08-25 DIAGNOSIS — D510 Vitamin B12 deficiency anemia due to intrinsic factor deficiency: Secondary | ICD-10-CM | POA: Diagnosis not present

## 2021-08-25 DIAGNOSIS — R3129 Other microscopic hematuria: Secondary | ICD-10-CM | POA: Diagnosis not present

## 2021-08-25 DIAGNOSIS — M069 Rheumatoid arthritis, unspecified: Secondary | ICD-10-CM | POA: Diagnosis not present

## 2021-08-25 DIAGNOSIS — N1832 Chronic kidney disease, stage 3b: Secondary | ICD-10-CM | POA: Diagnosis not present

## 2021-08-25 DIAGNOSIS — R809 Proteinuria, unspecified: Secondary | ICD-10-CM | POA: Diagnosis not present

## 2021-08-25 DIAGNOSIS — I129 Hypertensive chronic kidney disease with stage 1 through stage 4 chronic kidney disease, or unspecified chronic kidney disease: Secondary | ICD-10-CM | POA: Diagnosis not present

## 2021-08-25 NOTE — Progress Notes (Signed)
Cardiology Office Note:    Date:  08/28/2021   ID:  Emily Valentine, DOB 1950-05-23, MRN [id number]  PCP:  Sharilyn Sites, MD   [facility] Providers Cardiologist:  Jenkins Rouge, MD     Referring MD: Sharilyn Sites, MD   Chief Complaint: fatigue  History of Present Illness:    Emily Valentine is a 71 y.o. female with a hx of right BBB, HTN, frequent PVCs, COPD Gold 2, tobacco abuse, aortic atherosclerosis and CAD seen on CT. I have not seen her since 2019 followed primarily by PA/NP Last seen by Christen Bame on 02/26/21   She established care with our group in 2013 for abnormal EKG in the setting of presurgical clearance for bunion surgery.  She had a nuclear stress test which was low risk with no ischemia.  She was referred back to our office 2 years later by primary care for palpitations.  She was felt to have symptoms consistent with angina, and additional testing was ordered.  Her nuclear stress test showed no evidence of ischemia and LVEF 56%. Her echocardiogram showed an ejection fraction of 65 to 70% with vigorous contraction and no significant valve abnormalities  She has chronic pedal edema TTE 12/17/20 EF 70-75% moderate LVH normal diastolic parameters mild/mod TR and some RVE without elevated estimated PA pressures   Hospitalized 5/20-23/23 with diffuse myalgias, and weakness with cough Rx as outpatient first with prednisone and amoxicillin Temp 101.6 Hydrated and Rx with Ceftriaxone and Azithromycin Positive for parainfluenza virus CT with COPD Rx prednisone taper HCTZ held   Doing better BP up still Still with cough      Past Medical History:  Diagnosis Date   Chronic constipation    COVID-19 11/2020   Diverticulitis 08/2016   History of echocardiogram    Echo (8/15): Vigorous LV function, EF 65-70%, normal wall motion, grade 1 diastolic dysfunction, mild to moderate TR, PASP 31 mm Hg   Hx of cardiovascular stress test    ETT-Myoview (8/15): no ischemia, EF 56%,  normal study   Hypertension    Hypothyroid    Pernicious anemia    PVC's (premature ventricular contractions)    Rheumatoid arthritis ([place])    Right bundle branch block    S/P colonoscopy 2001, 2004   2001: hyperplastic polyp, 2004: normal, inflammatory polyp    Past Surgical History:  Procedure Laterality Date   ABDOMINAL HYSTERECTOMY     BACK SURGERY     BREAST BIOPSY Right 10/29/2010   BREAST BIOPSY Right 11/27/2014   BREAST EXCISIONAL BIOPSY Right 02/21/2015   BREAST LUMPECTOMY WITH RADIOACTIVE SEED LOCALIZATION Right 02/21/2015   Procedure: RIGHT BREAST LUMPECTOMY WITH RADIOACTIVE SEED LOCALIZATION;  Surgeon: Rolm Bookbinder, MD;  Location: [place];  Service: General;  Laterality: Right;   COLONOSCOPY  11/10/2010   Procedure: COLONOSCOPY;  Surgeon: Daneil Dolin, MD;  [facility];  Service: Endoscopy;  Laterality: N/A;   COLONOSCOPY N/A 09/28/2016   Dr. Gala Romney; Diverticulosis with evidence of recent diverticulitis.  Next colonoscopy in 2023 given history of tubular adenomas in the past.   THYROIDECTOMY      Current Medications: Current Meds  Medication Sig   acetaminophen (TYLENOL) 500 MG tablet Take 1,000 mg by mouth every 6 (six) hours as needed.   albuterol (VENTOLIN HFA) 108 (90 Base) MCG/ACT inhaler Inhale 1-2 puffs into the lungs every 6 (six) hours as needed for wheezing or shortness of breath.   aspirin EC 81 MG tablet Take  81 mg by mouth daily. Swallow whole.   cyanocobalamin (,VITAMIN B-12,) 1000 MCG/ML injection Inject into the muscle.   Cyanocobalamin (VITAMIN B-12 IJ) Inject 1,000 mcg as directed every 30 (thirty) days.   hydrocortisone cream 1 % Apply 1 application topically daily as needed for itching.   ipratropium-albuterol (DUONEB) 0.5-2.5 (3) MG/3ML SOLN Take 3 mLs by nebulization every 6 (six) hours as needed.   levothyroxine (SYNTHROID, LEVOTHROID) 88 MCG tablet Take 88 mcg by mouth daily before breakfast.   losartan  (COZAAR) 100 MG tablet Take 100 mg by mouth daily.   metoprolol succinate (TOPROL-XL) 25 MG 24 hr tablet TAKE (1) TABLET BY MOUTH DAILY.   nitroGLYCERIN (NITROSTAT) 0.4 MG SL tablet Place 1 tablet (0.4 mg total) under the tongue every 5 (five) minutes as needed for chest pain.   Plecanatide (TRULANCE) 3 MG TABS Take 3 mg by mouth daily. (Patient taking differently: Take 3 mg by mouth. 3 times per week)   predniSONE (DELTASONE) 50 MG tablet Take 1 tablet (50 mg total) by mouth daily with breakfast.   traMADol (ULTRAM) 50 MG tablet Take 50 mg by mouth as needed for moderate pain.   Wheat Dextrin (BENEFIBER) POWD Take 1 Dose by mouth. 3 times per week     Allergies:   Codeine and Oxycontin [oxycodone]   Social History   Socioeconomic History   Marital status: Married    Spouse name: Not on file   Number of children: Not on file   Years of education: Not on file   Highest education level: Not on file  Occupational History   Not on file  Tobacco Use   Smoking status: Former    Years: 35.00    Types: Cigarettes   Smokeless tobacco: Former   Tobacco comments:    3-4 cigs per day 07/30/20//lmr  Vaping Use   Vaping Use: Never used  Substance and Sexual Activity   Alcohol use: No   Drug use: No   Sexual activity: Yes    Birth control/protection: Surgical  Other Topics Concern   Not on file  Social History Narrative   Not on file   Social Determinants of Health   Financial Resource Strain: Medium Risk (01/22/2020)   Overall Financial Resource Strain (CARDIA)    Difficulty of Paying Living Expenses: Somewhat hard  Food Insecurity: No Food Insecurity (01/22/2020)   Hunger Vital Sign    Worried About Running Out of Food in the Last Year: Never true    Ran Out of Food in the Last Year: Never true  Transportation Needs: No Transportation Needs (01/22/2020)   PRAPARE - Hydrologist (Medical): No    Lack of Transportation (Non-Medical): No  Physical  Activity: Insufficiently Active (01/22/2020)   Exercise Vital Sign    Days of Exercise per Week: 1 day    Minutes of Exercise per Session: 60 min  Stress: Stress Concern Present (01/22/2020)   [place]    Feeling of Stress : To some extent  Social Connections: Socially Integrated (01/22/2020)   Social Connection and Isolation Panel [NHANES]    Frequency of Communication with Friends and Family: More than three times a week    Frequency of Social Gatherings with Friends and Family: Once a week    Attends Religious Services: More than 4 times per year    Active Member of [employer] or Organizations: Yes    Attends Archivist Meetings: More  than 4 times per year    Marital Status: Married     Family History: The patient's family history includes Anemia in her father, mother, and sister; Cancer in her brother, father, and mother; Colon cancer in her father; Diabetes in her sister; Hypertension in her brother, father, mother, and sister; Stomach cancer in her mother; Thyroid disease in her brother, mother, and sister.  ROS:   Please see the history of present illness.  All other systems reviewed and are negative.  Labs/Other Studies Reviewed:    The following studies were reviewed today:  Echo 12/17/20  Left Ventricle: Left ventricular ejection fraction, by estimation, is 70  to 75%. The left ventricle has hyperdynamic function. The left ventricle  has no regional wall motion abnormalities. The left ventricular internal  cavity size was small. There is moderate left ventricular hypertrophy. Left ventricular diastolic parameters were normal.  Right Ventricle: The right ventricular size is moderately enlarged. No  increase in right ventricular wall thickness. Right ventricular systolic  function is mildly reduced. There is normal pulmonary artery systolic  pressure. The tricuspid regurgitant velocity is 2.64 m/s, and  with an assumed right atrial pressure of 3 mmHg,the estimated right ventricular systolic pressure is 62.9 mmHg.  Left Atrium: Left atrial size was normal in size.  Right Atrium: Right atrial size was mild to moderately dilated.  Pericardium: There is no evidence of pericardial effusion.  Mitral Valve: The mitral valve is grossly normal. Mild mitral annular  calcification. Trivial mitral valve regurgitation. MV peak gradient, 3.1  mmHg. The mean mitral valve gradient is 1.0 mmHg.  Tricuspid Valve: The tricuspid valve is grossly normal. Tricuspid valve  regurgitation is mild to moderate.  Aortic Valve: The aortic valve is tricuspid. There is mild aortic valve  annular calcification. Aortic valve regurgitation is not visualized. No  aortic stenosis is present. Aortic valve mean gradient measures 4.0 mmHg. Aortic valve peak gradient measures 8.6 mmHg. Aortic valve area, by VTI measures 2.32 cm.  Pulmonic Valve: The pulmonic valve was grossly normal. Pulmonic valve regurgitation is trivial.  Aorta: The aortic root is normal in size and structure.  Venous: The inferior vena cava is normal in size with greater than 50%  respiratory variability, suggesting right atrial pressure of 3 mmHg.  IAS/Shunts: No atrial level shunt detected by color flow Doppler.   CT Chest Lung Cancer screening  IMPRESSION: 1. Lung-RADS 2, benign appearance or behavior. Continue annual screening with low-dose chest CT without contrast in 12 months. 2. Three-vessel coronary atherosclerosis. 3. Aortic Atherosclerosis (ICD10-I70.0) and Emphysema (ICD10-J43.9).    Myoview 8/15  No ischemia.    Recent Labs: 08/03/2021: ALT 17; TSH 0.376 08/04/2021: Hemoglobin 10.1; Platelets 178 08/05/2021: BUN 18; Creatinine, Ser 1.18; Magnesium 2.0; Potassium 3.5; Sodium 140  Recent Lipid Panel    Component Value Date/Time   CHOL 169 02/26/2021 0844   TRIG 113 02/26/2021 0844   HDL 66 02/26/2021 0844   CHOLHDL 2.6 02/26/2021  0844   CHOLHDL 4 10/16/2013 1023   VLDL 23.2 10/16/2013 1023   LDLCALC 83 02/26/2021 0844      Physical Exam:    VS:  BP (!) 152/92   Pulse 90   Ht '5\' 2"'$  (1.575 m)   Wt 134 lb (60.8 kg)   SpO2 99%   BMI 24.51 kg/m     Wt Readings from Last 3 Encounters:  08/28/21 134 lb (60.8 kg)  08/02/21 134 lb 14.7 oz (61.2 kg)  05/26/21 135 lb (61.2 kg)  GEN:  Well nourished, well developed in no acute distress HEENT: Normal NECK: No JVD; No carotid bruits LYMPHATICS: No lymphadenopathy CARDIAC: RRR, no murmurs, rubs, gallops RESPIRATORY:  Decreased air movement bilateral lung bases. No rales, wheezing or rhonchi.  ABDOMEN: Soft, non-tender, non-distended MUSCULOSKELETAL:  No edema; No deformity  SKIN: Warm and dry NEUROLOGIC:  Alert and oriented x 3 PSYCHIATRIC:  Normal affect   EKG:  EKG is not ordered today.    Diagnoses:    No diagnosis found.  Assessment and Plan:     Essential hypertension: She has resumed losartan Encouraged her to limit sodium intake to 2000 mg/day  Too high Start norvasc 10 mg f/u primary She is seeing [place] with renal and diuretic has been stopped Not volume overloaded Cr 1.2 per patient can consider this if Norvasc does not help with BP  Aortic atherosclerosis: Aortic atherosclerosis and three-vessel coronary atherosclerosis noted on chest CT 7/22.  No chest pain normla myovue 2015  LDL 83    Smoking cessation: She stopped smoking September 2022 in the setting of Covid infection. I congratulated her on this achievement. Clinical COPD with recent influenza infection Non contrast chest CT ordered Per primary consider f/u pulmonary   Right bundle branch block: History of right bundle branch block.  Stable on ECG done 08/04/21 no high grade AV block   Non contrast CT scan chest per primary Norvasc 10 mg for BP  Disposition: F/U in a year        Signed, Jenkins Rouge, MD  08/28/2021 9:07 AM    [place]

## 2021-08-27 ENCOUNTER — Other Ambulatory Visit (HOSPITAL_COMMUNITY): Payer: Self-pay | Admitting: Nephrology

## 2021-08-27 ENCOUNTER — Other Ambulatory Visit: Payer: Self-pay | Admitting: Nephrology

## 2021-08-27 DIAGNOSIS — M069 Rheumatoid arthritis, unspecified: Secondary | ICD-10-CM

## 2021-08-27 DIAGNOSIS — R809 Proteinuria, unspecified: Secondary | ICD-10-CM

## 2021-08-27 DIAGNOSIS — R3129 Other microscopic hematuria: Secondary | ICD-10-CM

## 2021-08-27 DIAGNOSIS — I129 Hypertensive chronic kidney disease with stage 1 through stage 4 chronic kidney disease, or unspecified chronic kidney disease: Secondary | ICD-10-CM

## 2021-08-27 DIAGNOSIS — D510 Vitamin B12 deficiency anemia due to intrinsic factor deficiency: Secondary | ICD-10-CM

## 2021-08-27 DIAGNOSIS — N1832 Chronic kidney disease, stage 3b: Secondary | ICD-10-CM

## 2021-08-28 ENCOUNTER — Ambulatory Visit: Payer: Medicare PPO | Admitting: Cardiovascular Disease

## 2021-08-28 ENCOUNTER — Encounter: Payer: Self-pay | Admitting: Cardiovascular Disease

## 2021-08-28 VITALS — BP 152/92 | HR 90 | Ht 62.0 in | Wt 134.0 lb

## 2021-08-28 DIAGNOSIS — Z720 Tobacco use: Secondary | ICD-10-CM

## 2021-08-28 DIAGNOSIS — I10 Essential (primary) hypertension: Secondary | ICD-10-CM | POA: Diagnosis not present

## 2021-08-28 DIAGNOSIS — I4510 Unspecified right bundle-branch block: Secondary | ICD-10-CM | POA: Diagnosis not present

## 2021-08-28 MED ORDER — AMLODIPINE BESYLATE 10 MG PO TABS: 10.0000 mg | ORAL_TABLET | Freq: Every day | ORAL | 3 refills | Status: DC

## 2021-08-28 NOTE — Patient Instructions (Signed)
Medication Instructions:  Your physician has recommended you make the following change in your medication:  1-START amlodipine 10 mg by mouth daily.  *If you need a refill on your cardiac medications before your next appointment, please call your pharmacy*  Lab Work: If you have labs (blood work) drawn today and your tests are completely normal, you will receive your results only by: My Chart Message (if you have My Chart) OR A paper copy in the mail If you have any lab test that is abnormal or we need to change your treatment, we will call you to review the results.  Testing/Procedures: None ordered today.  Follow-Up: At [facility], you and your health needs are our priority.  As part of our continuing mission to provide you with exceptional heart care, we have created designated Provider Care Teams.  These Care Teams include your primary Cardiologist (physician) and Advanced Practice Providers (APPs -  Physician Assistants and Nurse Practitioners) who all work together to provide you with the care you need, when you need it.  We recommend signing up for the patient portal called "MyChart".  Sign up information is provided on this After Visit Summary.  MyChart is used to connect with patients for Virtual Visits (Telemedicine).  Patients are able to view lab/test results, encounter notes, upcoming appointments, etc.  Non-urgent messages can be sent to your provider as well.   To learn more about what you can do with MyChart, go to [email or web address].    Your next appointment:   1 year(s)  The format for your next appointment:   In Person  Provider:   Jenkins Rouge, MD {   Important Information About Sugar

## 2021-09-01 DIAGNOSIS — J449 Chronic obstructive pulmonary disease, unspecified: Secondary | ICD-10-CM | POA: Diagnosis not present

## 2021-09-02 ENCOUNTER — Ambulatory Visit: Payer: Medicare PPO | Admitting: Internal Medicine

## 2021-09-02 ENCOUNTER — Encounter: Payer: Self-pay | Admitting: Internal Medicine

## 2021-09-02 VITALS — BP 109/72 | HR 101 | Temp 98.4°F | Ht 62.0 in | Wt 135.4 lb

## 2021-09-02 DIAGNOSIS — Z86010 Personal history of colonic polyps: Secondary | ICD-10-CM

## 2021-09-02 DIAGNOSIS — K5909 Other constipation: Secondary | ICD-10-CM

## 2021-09-02 NOTE — Patient Instructions (Signed)
It was good to see you again today!  As discussed, you would be best served by taking her Benefiber at the same time every day.  Take it every day whether you think you needed or not.  Continue using Trulance daily as tolerated for constipation  We will plan for a surveillance colonoscopy (history of polyps) in August of this year.  ASA 2  We will get that on the schedule in the near future.

## 2021-09-02 NOTE — Progress Notes (Unsigned)
Primary Care Physician:  Sharilyn Sites, MD Primary Gastroenterologist:  Dr. Gala Romney  Pre-Procedure History & Physical: HPI:  Emily Valentine is a 71 y.o. female here for follow-up of constipation.  Distant history colonic adenoma; due for surveillance colonoscopy this year.  Just hospitalized last month with pneumonia.  She is slowly recovering.  Still constipated taking Trulance 3-4 times weekly she takes it every day gripe her.  Takes Benefiber only sporadically.  Has not had any rectal bleeding. She has known diverticulosis.  Past Medical History:  Diagnosis Date   Chronic constipation    COVID-19 11/2020   Diverticulitis 08/2016   History of echocardiogram    Echo (8/15): Vigorous LV function, EF 65-70%, normal wall motion, grade 1 diastolic dysfunction, mild to moderate TR, PASP 31 mm Hg   Hx of cardiovascular stress test    ETT-Myoview (8/15): no ischemia, EF 56%, normal study   Hypertension    Hypothyroid    Pernicious anemia    PVC's (premature ventricular contractions)    Rheumatoid arthritis ([place])    Right bundle branch block    S/P colonoscopy 2001, 2004   2001: hyperplastic polyp, 2004: normal, inflammatory polyp    Past Surgical History:  Procedure Laterality Date   ABDOMINAL HYSTERECTOMY     BACK SURGERY     BREAST BIOPSY Right 10/29/2010   BREAST BIOPSY Right 11/27/2014   BREAST EXCISIONAL BIOPSY Right 02/21/2015   BREAST LUMPECTOMY WITH RADIOACTIVE SEED LOCALIZATION Right 02/21/2015   Procedure: RIGHT BREAST LUMPECTOMY WITH RADIOACTIVE SEED LOCALIZATION;  Surgeon: Rolm Bookbinder, MD;  Location: [place];  Service: General;  Laterality: Right;   COLONOSCOPY  11/10/2010   Procedure: COLONOSCOPY;  Surgeon: Daneil Dolin, MD;  [facility];  Service: Endoscopy;  Laterality: N/A;   COLONOSCOPY N/A 09/28/2016   Dr. Gala Romney; Diverticulosis with evidence of recent diverticulitis.  Next colonoscopy in 2023 given history of tubular  adenomas in the past.   THYROIDECTOMY      Prior to Admission medications   Medication Sig Start Date End Date Taking? Authorizing Provider  acetaminophen (TYLENOL) 500 MG tablet Take 1,000 mg by mouth every 6 (six) hours as needed.   Yes [provider]  albuterol (VENTOLIN HFA) 108 (90 Base) MCG/ACT inhaler Inhale 1-2 puffs into the lungs every 6 (six) hours as needed for wheezing or shortness of breath.   Yes [provider]  amLODipine (NORVASC) 10 MG tablet Take 1 tablet (10 mg total) by mouth daily. 08/28/21  Yes Josue Hector, MD  aspirin EC 81 MG tablet Take 81 mg by mouth daily. Swallow whole.   Yes [provider]  Budeson-Glycopyrrol-Formoterol (BREZTRI AEROSPHERE) 160-9-4.8 MCG/ACT AERO Inhale into the lungs.   Yes [provider]  cyanocobalamin (,VITAMIN B-12,) 1000 MCG/ML injection Inject into the muscle. 05/30/21  Yes [provider]  hydrocortisone cream 1 % Apply 1 application topically daily as needed for itching.   Yes [provider]  ipratropium-albuterol (DUONEB) 0.5-2.5 (3) MG/3ML SOLN Take 3 mLs by nebulization every 6 (six) hours as needed. 08/04/21  Yes Tat, Shanon Brow, MD  levothyroxine (SYNTHROID, LEVOTHROID) 88 MCG tablet Take 88 mcg by mouth daily before breakfast.   Yes [provider]  losartan (COZAAR) 100 MG tablet Take 100 mg by mouth daily. 07/23/21  Yes [provider]  metoprolol succinate (TOPROL-XL) 25 MG 24 hr tablet TAKE (1) TABLET BY MOUTH DAILY. 08/09/20  Yes Isaiah Serge, NP  Plecanatide Mellody Memos)  3 MG TABS Take 3 mg by mouth daily. Patient taking differently: Take 3 mg by mouth. 3 times per week 11/06/20  Yes Harper, Kristen S, PA-C  Wheat Dextrin (BENEFIBER) POWD Take 1 Dose by mouth. 3 times per week   Yes [provider]    Allergies as of 09/02/2021 - Review Complete 09/02/2021  Allergen Reaction Noted   Codeine Nausea Only 08/24/2016   Oxycontin [oxycodone] Nausea  And Vomiting 09/01/2011    Family History  Problem Relation Age of Onset   Stomach cancer Mother        deceased   Anemia Mother    Cancer Mother    Hypertension Mother    Thyroid disease Mother    Colon cancer Father        diagnosed age 14, died at age 31   Anemia Father    Cancer Father    Hypertension Father    Anemia Sister    Cancer Brother    Diabetes Sister    Hypertension Sister    Hypertension Brother    Thyroid disease Brother    Thyroid disease Sister     Social History   Socioeconomic History   Marital status: Married    Spouse name: Not on file   Number of children: Not on file   Years of education: Not on file   Highest education level: Not on file  Occupational History   Not on file  Tobacco Use   Smoking status: Former    Years: 35.00    Types: Cigarettes   Smokeless tobacco: Former   Tobacco comments:    3-4 cigs per day 07/30/20//lmr  Vaping Use   Vaping Use: Never used  Substance and Sexual Activity   Alcohol use: No   Drug use: No   Sexual activity: Yes    Birth control/protection: Surgical  Other Topics Concern   Not on file  Social History Narrative   Not on file   Social Determinants of Health   Financial Resource Strain: Medium Risk (01/22/2020)   Overall Financial Resource Strain (CARDIA)    Difficulty of Paying Living Expenses: Somewhat hard  Food Insecurity: No Food Insecurity (01/22/2020)   Hunger Vital Sign    Worried About Running Out of Food in the Last Year: Never true    Ran Out of Food in the Last Year: Never true  Transportation Needs: No Transportation Needs (01/22/2020)   PRAPARE - Hydrologist (Medical): No    Lack of Transportation (Non-Medical): No  Physical Activity: Insufficiently Active (01/22/2020)   Exercise Vital Sign    Days of Exercise per Week: 1 day    Minutes of Exercise per Session: 60 min  Stress: Stress Concern Present (01/22/2020)   [place]    Feeling of Stress : To some extent  Social Connections: Socially Integrated (01/22/2020)   Social Connection and Isolation Panel [NHANES]    Frequency of Communication with Friends and Family: More than three times a week    Frequency of Social Gatherings with Friends and Family: Once a week    Attends Religious Services: More than 4 times per year    Active Member of [employer] or Organizations: Yes    Attends Archivist Meetings: More than 4 times per year    Marital Status: Married  Human resources officer Violence: Not At Risk (01/22/2020)   Humiliation, Afraid, Rape, and Kick questionnaire    Fear  of Current or Ex-Partner: No    Emotionally Abused: No    Physically Abused: No    Sexually Abused: No    Review of Systems: See HPI, otherwise negative ROS  Physical Exam: BP 109/72 (BP Location: Left Arm, Patient Position: Sitting, Cuff Size: Normal)   Pulse (!) 101   Temp 98.4 F (36.9 C) (Temporal)   Ht '5\' 2"'$  (1.575 m)   Wt 135 lb 6.4 oz (61.4 kg)   SpO2 98%   BMI 24.76 kg/m  General:   Alert,  Well-developed, well-nourished, pleasant and cooperative in NAD Neck:  Supple; no masses or thyromegaly. No significant cervical adenopathy. Lungs:  Clear throughout to auscultation.   No wheezes, crackles, or rhonchi. No acute distress. Heart:  Regular rate and rhythm; no murmurs, clicks, rubs,  or gallops. Abdomen: Non-distended, normal bowel sounds.  Soft and nontender without appreciable mass or hepatosplenomegaly.  Pulses:  Normal pulses noted. Extremities:  Without clubbing or edema.  Impression/Plan: 71 year old lady with a distant history colonic adenoma and constipation.  Takes fiber and Trulance sporadically.  She would be best served by taking the Benefiber on a regular basis and then taking Trulance as needed.  Would like to have at least 3 bowel movements weekly or 1 every other day.  Recommendations:  As discussed, you would  be best served by taking her Benefiber at the same time every day.  Take it every day whether you think you needed or not.  Continue using Trulance daily as tolerated for constipation  We will plan for a surveillance colonoscopy (history of polyps) in August of this year.  ASA 2  We will get that on the schedule in the near future.     Notice: This dictation was prepared with Dragon dictation along with smaller phrase technology. Any transcriptional errors that result from this process are unintentional and may not be corrected upon review.

## 2021-09-05 ENCOUNTER — Ambulatory Visit (HOSPITAL_COMMUNITY)
Admission: RE | Admit: 2021-09-05 | Discharge: 2021-09-05 | Disposition: A | Discharge status: Home / Self Care | Payer: Medicare PPO | Source: Ambulatory Visit | Attending: Nephrology | Admitting: Nephrology

## 2021-09-05 DIAGNOSIS — I129 Hypertensive chronic kidney disease with stage 1 through stage 4 chronic kidney disease, or unspecified chronic kidney disease: Secondary | ICD-10-CM | POA: Insufficient documentation

## 2021-09-05 DIAGNOSIS — M069 Rheumatoid arthritis, unspecified: Secondary | ICD-10-CM | POA: Insufficient documentation

## 2021-09-05 DIAGNOSIS — N189 Chronic kidney disease, unspecified: Secondary | ICD-10-CM | POA: Diagnosis not present

## 2021-09-05 DIAGNOSIS — N1832 Chronic kidney disease, stage 3b: Secondary | ICD-10-CM | POA: Diagnosis not present

## 2021-09-05 DIAGNOSIS — D510 Vitamin B12 deficiency anemia due to intrinsic factor deficiency: Secondary | ICD-10-CM | POA: Insufficient documentation

## 2021-09-05 DIAGNOSIS — R809 Proteinuria, unspecified: Secondary | ICD-10-CM | POA: Diagnosis not present

## 2021-09-05 DIAGNOSIS — R3129 Other microscopic hematuria: Secondary | ICD-10-CM | POA: Diagnosis not present

## 2021-09-11 ENCOUNTER — Telehealth: Payer: Self-pay | Admitting: *Deleted

## 2021-09-11 MED ORDER — PEG 3350-KCL-NA BICARB-NACL 420 G PO SOLR: ORAL | 0 refills | Status: DC

## 2021-09-11 NOTE — Telephone Encounter (Signed)
Called pt. She has been scheduled for TCS with Dr. Gala Romney at 7:30am. Aware will mail instructions and send prep rx to pharmacy.

## 2021-09-23 DIAGNOSIS — M1990 Unspecified osteoarthritis, unspecified site: Secondary | ICD-10-CM | POA: Diagnosis not present

## 2021-09-23 DIAGNOSIS — Z79899 Other long term (current) drug therapy: Secondary | ICD-10-CM | POA: Diagnosis not present

## 2021-09-23 DIAGNOSIS — M059 Rheumatoid arthritis with rheumatoid factor, unspecified: Secondary | ICD-10-CM | POA: Diagnosis not present

## 2021-09-23 DIAGNOSIS — M549 Dorsalgia, unspecified: Secondary | ICD-10-CM | POA: Diagnosis not present

## 2021-09-23 DIAGNOSIS — J449 Chronic obstructive pulmonary disease, unspecified: Secondary | ICD-10-CM | POA: Diagnosis not present

## 2021-09-23 DIAGNOSIS — M8580 Other specified disorders of bone density and structure, unspecified site: Secondary | ICD-10-CM | POA: Diagnosis not present

## 2021-10-10 ENCOUNTER — Telehealth: Payer: Self-pay | Admitting: *Deleted

## 2021-10-10 MED ORDER — PEG 3350-KCL-NA BICARB-NACL 420 G PO SOLR: ORAL | 0 refills | Status: DC

## 2021-10-10 NOTE — Telephone Encounter (Signed)
Received call from pt needed new rx for prep sent in. Her spouse accidentally threw away her prep. Rx sent.

## 2021-10-15 ENCOUNTER — Telehealth: Payer: Self-pay | Admitting: Internal Medicine

## 2021-10-15 NOTE — Telephone Encounter (Signed)
Pt's husband died last night and needs to cancel her procedure with [facility] on 8/74/2023. The daughter said they would call back later to reschedule and was told to hit option 5 .

## 2021-10-15 NOTE — Telephone Encounter (Signed)
Noted message sent to endo to cancel.

## 2021-10-20 ENCOUNTER — Encounter (HOSPITAL_COMMUNITY): Admission: RE | Payer: Self-pay | Source: Home / Self Care

## 2021-10-20 ENCOUNTER — Ambulatory Visit (HOSPITAL_COMMUNITY): Admission: RE | Admit: 2021-10-20 | Payer: Medicare PPO | Source: Home / Self Care | Admitting: Internal Medicine

## 2021-10-20 SURGERY — COLONOSCOPY WITH PROPOFOL
Anesthesia: Monitor Anesthesia Care

## 2021-10-29 ENCOUNTER — Encounter: Payer: Self-pay | Admitting: *Deleted

## 2021-11-06 ENCOUNTER — Telehealth: Payer: Self-pay | Admitting: *Deleted

## 2021-11-06 NOTE — Telephone Encounter (Signed)
Spoke with pt. She is ready to reschedule procedure. She is now on for 9/25 at 11:00am. She already has prep at home. Will send new instructions to pt.   PA approved via cohere. Authorization #[id number], DOS: 12/08/2021 - 03/08/2022

## 2021-11-10 ENCOUNTER — Other Ambulatory Visit: Payer: Self-pay | Admitting: Cardiology

## 2021-11-10 DIAGNOSIS — J439 Emphysema, unspecified: Secondary | ICD-10-CM | POA: Diagnosis not present

## 2021-11-10 DIAGNOSIS — M069 Rheumatoid arthritis, unspecified: Secondary | ICD-10-CM | POA: Diagnosis not present

## 2021-11-10 DIAGNOSIS — Z6824 Body mass index (BMI) 24.0-24.9, adult: Secondary | ICD-10-CM | POA: Diagnosis not present

## 2021-11-10 DIAGNOSIS — Z720 Tobacco use: Secondary | ICD-10-CM | POA: Diagnosis not present

## 2021-11-10 DIAGNOSIS — J449 Chronic obstructive pulmonary disease, unspecified: Secondary | ICD-10-CM | POA: Diagnosis not present

## 2021-11-18 ENCOUNTER — Other Ambulatory Visit: Payer: Self-pay | Admitting: Gastroenterology

## 2021-11-18 DIAGNOSIS — K5900 Constipation, unspecified: Secondary | ICD-10-CM

## 2021-11-20 ENCOUNTER — Telehealth: Payer: Self-pay | Admitting: *Deleted

## 2021-11-20 NOTE — Telephone Encounter (Signed)
Pt had left vm stating she needed to know about her prep instructions.  Emily Valentine

## 2021-11-24 DIAGNOSIS — J449 Chronic obstructive pulmonary disease, unspecified: Secondary | ICD-10-CM | POA: Diagnosis not present

## 2021-11-24 DIAGNOSIS — Z79899 Other long term (current) drug therapy: Secondary | ICD-10-CM | POA: Diagnosis not present

## 2021-11-24 DIAGNOSIS — M1990 Unspecified osteoarthritis, unspecified site: Secondary | ICD-10-CM | POA: Diagnosis not present

## 2021-11-24 DIAGNOSIS — M8580 Other specified disorders of bone density and structure, unspecified site: Secondary | ICD-10-CM | POA: Diagnosis not present

## 2021-11-24 DIAGNOSIS — M549 Dorsalgia, unspecified: Secondary | ICD-10-CM | POA: Diagnosis not present

## 2021-11-24 DIAGNOSIS — M059 Rheumatoid arthritis with rheumatoid factor, unspecified: Secondary | ICD-10-CM | POA: Diagnosis not present

## 2021-12-04 NOTE — Telephone Encounter (Signed)
No return call 

## 2021-12-08 ENCOUNTER — Ambulatory Visit (HOSPITAL_BASED_OUTPATIENT_CLINIC_OR_DEPARTMENT_OTHER): Payer: Medicare PPO | Admitting: Anesthesiology

## 2021-12-08 ENCOUNTER — Telehealth: Payer: Self-pay | Admitting: *Deleted

## 2021-12-08 ENCOUNTER — Ambulatory Visit (HOSPITAL_COMMUNITY): Payer: Medicare PPO | Admitting: Anesthesiology

## 2021-12-08 ENCOUNTER — Encounter (HOSPITAL_COMMUNITY)
Admission: RE | Disposition: A | Discharge status: Home / Self Care | Payer: Self-pay | Source: Home / Self Care | Attending: Internal Medicine

## 2021-12-08 ENCOUNTER — Encounter (HOSPITAL_COMMUNITY): Payer: Self-pay | Admitting: Internal Medicine

## 2021-12-08 ENCOUNTER — Other Ambulatory Visit: Payer: Self-pay

## 2021-12-08 ENCOUNTER — Ambulatory Visit (HOSPITAL_COMMUNITY)
Admission: RE | Admit: 2021-12-08 | Discharge: 2021-12-08 | Disposition: A | Discharge status: Home / Self Care | Payer: Medicare PPO | Attending: Internal Medicine | Admitting: Internal Medicine

## 2021-12-08 ENCOUNTER — Other Ambulatory Visit: Payer: Self-pay | Admitting: *Deleted

## 2021-12-08 DIAGNOSIS — E890 Postprocedural hypothyroidism: Secondary | ICD-10-CM | POA: Diagnosis not present

## 2021-12-08 DIAGNOSIS — E039 Hypothyroidism, unspecified: Secondary | ICD-10-CM | POA: Diagnosis not present

## 2021-12-08 DIAGNOSIS — D123 Benign neoplasm of transverse colon: Secondary | ICD-10-CM | POA: Insufficient documentation

## 2021-12-08 DIAGNOSIS — Z09 Encounter for follow-up examination after completed treatment for conditions other than malignant neoplasm: Secondary | ICD-10-CM | POA: Diagnosis not present

## 2021-12-08 DIAGNOSIS — Z9889 Other specified postprocedural states: Secondary | ICD-10-CM | POA: Insufficient documentation

## 2021-12-08 DIAGNOSIS — K5730 Diverticulosis of large intestine without perforation or abscess without bleeding: Secondary | ICD-10-CM | POA: Diagnosis not present

## 2021-12-08 DIAGNOSIS — K635 Polyp of colon: Secondary | ICD-10-CM

## 2021-12-08 DIAGNOSIS — K56699 Other intestinal obstruction unspecified as to partial versus complete obstruction: Secondary | ICD-10-CM | POA: Diagnosis not present

## 2021-12-08 DIAGNOSIS — Z86010 Personal history of colonic polyps: Secondary | ICD-10-CM | POA: Insufficient documentation

## 2021-12-08 DIAGNOSIS — Z1211 Encounter for screening for malignant neoplasm of colon: Secondary | ICD-10-CM | POA: Insufficient documentation

## 2021-12-08 DIAGNOSIS — R109 Unspecified abdominal pain: Secondary | ICD-10-CM

## 2021-12-08 DIAGNOSIS — Z87891 Personal history of nicotine dependence: Secondary | ICD-10-CM | POA: Insufficient documentation

## 2021-12-08 HISTORY — PX: POLYPECTOMY: SHX5525

## 2021-12-08 HISTORY — PX: BIOPSY: SHX5522

## 2021-12-08 HISTORY — PX: COLONOSCOPY WITH PROPOFOL: SHX5780

## 2021-12-08 HISTORY — PX: SUBMUCOSAL TATTOO INJECTION: SHX6856

## 2021-12-08 SURGERY — COLONOSCOPY WITH PROPOFOL
Anesthesia: General

## 2021-12-08 MED ORDER — LACTATED RINGERS IV SOLN: INTRAVENOUS | Status: DC | PRN

## 2021-12-08 MED ORDER — EPHEDRINE SULFATE-NACL 50-0.9 MG/10ML-% IV SOSY
PREFILLED_SYRINGE | INTRAVENOUS | Status: DC | PRN
  Administered 2021-12-08: 10 mg via INTRAVENOUS

## 2021-12-08 MED ORDER — GLYCOPYRROLATE PF 0.2 MG/ML IJ SOSY
PREFILLED_SYRINGE | INTRAMUSCULAR | Status: DC | PRN
  Administered 2021-12-08: .2 mg via INTRAVENOUS

## 2021-12-08 MED ORDER — PROPOFOL 500 MG/50ML IV EMUL
INTRAVENOUS | Status: DC | PRN
  Administered 2021-12-08: 200 ug/kg/min via INTRAVENOUS

## 2021-12-08 MED ORDER — LACTATED RINGERS IV SOLN
INTRAVENOUS | Status: DC
Start: 2021-12-08 — End: 2021-12-08

## 2021-12-08 MED ORDER — LIDOCAINE HCL (CARDIAC) PF 100 MG/5ML IV SOSY
PREFILLED_SYRINGE | INTRAVENOUS | Status: DC | PRN
  Administered 2021-12-08: 50 mg via INTRATRACHEAL

## 2021-12-08 MED ORDER — PROPOFOL 10 MG/ML IV BOLUS
INTRAVENOUS | Status: DC | PRN
  Administered 2021-12-08: 70 mg via INTRAVENOUS

## 2021-12-08 NOTE — Discharge Instructions (Addendum)
  Colonoscopy Discharge Instructions  Read the instructions outlined below and refer to this sheet in the next few weeks. These discharge instructions provide you with general information on caring for yourself after you leave the hospital. Your doctor may also give you specific instructions. While your treatment has been planned according to the most current medical practices available, unavoidable complications occasionally occur. If you have any problems or questions after discharge, call Dr. Gala Romney at [phone]. ACTIVITY You may resume your regular activity, but move at a slower pace for the next 24 hours.  Take frequent rest periods for the next 24 hours.  Walking will help get rid of the air and reduce the bloated feeling in your belly (abdomen).  No driving for 24 hours (because of the medicine (anesthesia) used during the test).   Do not sign any important legal documents or operate any machinery for 24 hours (because of the anesthesia used during the test).  NUTRITION Drink plenty of fluids.  You may resume your normal diet as instructed by your doctor.  Begin with a light meal and progress to your normal diet. Heavy or fried foods are harder to digest and may make you feel sick to your stomach (nauseated).  Avoid alcoholic beverages for 24 hours or as instructed.  MEDICATIONS You may resume your normal medications unless your doctor tells you otherwise.  WHAT YOU CAN EXPECT TODAY Some feelings of bloating in the abdomen.  Passage of more gas than usual.  Spotting of blood in your stool or on the toilet paper.  IF YOU HAD POLYPS REMOVED DURING THE COLONOSCOPY: No aspirin products for 7 days or as instructed.  No alcohol for 7 days or as instructed.  Eat a soft diet for the next 24 hours.  FINDING OUT THE RESULTS OF YOUR TEST Not all test results are available during your visit. If your test results are not back during the visit, make an appointment with your caregiver to find out the  results. Do not assume everything is normal if you have not heard from your caregiver or the medical facility. It is important for you to follow up on all of your test results.  SEEK IMMEDIATE MEDICAL ATTENTION IF: You have more than a spotting of blood in your stool.  Your belly is swollen (abdominal distention).  You are nauseated or vomiting.  You have a temperature over 101.  You have abdominal pain or discomfort that is severe or gets worse throughout the day.     1 polyp removed your colon.  Your the left side of your colon is narrowed.  Biopsies taken  Further recommendations to follow pending review of pathology  I am going to schedule a CT of the abdomen and pelvis with and without contrast to further evaluate your right-sided abdominal pain and your narrowed colon  Further recommendations   To follow.   ***LEFT MESSAGE AT OFFICE TO CONTACT PATIENT REGARDING SCHEDULING A CT SCAN***    At patient request, called Rosalyn Crosby at [phone] -  reviewed findings and recommendations

## 2021-12-08 NOTE — Transfer of Care (Signed)
Immediate Anesthesia Transfer of Care Note  Patient: Emily Valentine  Procedure(s) Performed: COLONOSCOPY WITH PROPOFOL POLYPECTOMY BIOPSY SUBMUCOSAL TATTOO INJECTION  Patient Location: Endoscopy Unit  Anesthesia Type:General  Level of Consciousness: sedated and patient cooperative  Airway & Oxygen Therapy: Patient Spontanous Breathing  Post-op Assessment: Report given to RN, Post -op Vital signs reviewed and stable and Patient moving all extremities  Post vital signs: Reviewed and stable  Last Vitals:  Vitals Value Taken Time  BP 140/89 12/08/21 1147  Temp 36.5 C 12/08/21 1147  Pulse 99 12/08/21 1147  Resp 18 12/08/21 1147  SpO2 100 % 12/08/21 1147    Last Pain:  Vitals:   12/08/21 1147  TempSrc: Axillary  PainSc: 0-No pain      Patients Stated Pain Goal: 6 (40/69/86 1483)  Complications: No notable events documented.

## 2021-12-08 NOTE — Anesthesia Preprocedure Evaluation (Signed)
Anesthesia Evaluation  Patient identified by MRN, date of birth, ID band Patient awake    Reviewed: Allergy & Precautions, H&P , NPO status , Patient's Chart, lab work & pertinent test results, reviewed documented beta blocker date and time   Airway Mallampati: II  TM Distance: >3 FB Neck ROM: full    Dental no notable dental hx.    Pulmonary neg pulmonary ROS, former smoker,    Pulmonary exam normal breath sounds clear to auscultation       Cardiovascular Exercise Tolerance: Good hypertension, negative cardio ROS   Rhythm:regular Rate:Normal     Neuro/Psych negative neurological ROS  negative psych ROS   GI/Hepatic negative GI ROS, Neg liver ROS,   Endo/Other  Hypothyroidism   Renal/GU negative Renal ROS  negative genitourinary   Musculoskeletal   Abdominal   Peds  Hematology  (+) Blood dyscrasia, anemia ,   Anesthesia Other Findings   Reproductive/Obstetrics negative OB ROS                             Anesthesia Physical Anesthesia Plan  ASA: 2  Anesthesia Plan: General   Post-op Pain Management:    Induction:   PONV Risk Score and Plan: Propofol infusion  Airway Management Planned:   Additional Equipment:   Intra-op Plan:   Post-operative Plan:   Informed Consent: I have reviewed the patients History and Physical, chart, labs and discussed the procedure including the risks, benefits and alternatives for the proposed anesthesia with the patient or authorized representative who has indicated his/her understanding and acceptance.     Dental Advisory Given  Plan Discussed with: CRNA  Anesthesia Plan Comments:         Anesthesia Quick Evaluation

## 2021-12-08 NOTE — Op Note (Signed)
[name] Patient Name: Emily Valentine Procedure Date: 12/08/2021 10:57 AM MRN: [id number] Date of Birth: January 01, 1951 Attending MD: Norvel Richards , MD CSN: [id number] Age: 71 Admit Type: Outpatient Procedure:                Colonoscopy Indications:              High risk colon cancer surveillance: Personal                            history of colonic polyps Providers:                Norvel Richards, MD, Janeece Riggers, RN, Casimer Bilis, Technician Referring MD:              Medicines:                 Complications:            No immediate complications. Estimated Blood Loss:     Estimated blood loss was minimal. Procedure:                Pre-Anesthesia Assessment:                           - Prior to the procedure, a History and Physical                            was performed, and patient medications and                            allergies were reviewed. The patient's tolerance of                            previous anesthesia was also reviewed. The risks                            and benefits of the procedure and the sedation                            options and risks were discussed with the patient.                            All questions were answered, and informed consent                            was obtained. Prior Anticoagulants: The patient has                            taken no previous anticoagulant or antiplatelet                            agents. ASA Grade Assessment: III - A patient with  severe systemic disease. After reviewing the risks                            and benefits, the patient was deemed in                            satisfactory condition to undergo the procedure.                           After obtaining informed consent, the colonoscope                            was passed under direct vision. Throughout the                            procedure, the patient's blood  pressure, pulse, and                            oxygen saturations were monitored continuously. The                            [id number]) scope was introduced through the                            anus and advanced to the the cecum, identified by                            appendiceal orifice and ileocecal valve. The                            colonoscopy was performed without difficulty. The                            patient tolerated the procedure well. The quality                            of the bowel preparation was adequate. Scope In: 22:29:79 AM Scope Out: 11:42:42 AM Scope Withdrawal Time: 0 hours 16 minutes 0 seconds  Total Procedure Duration: 0 hours 26 minutes 24 seconds  Findings:      The perianal and digital rectal examinations were normal.      Scattered medium-mouthed diverticula were found in the sigmoid colon and       descending colon. Apparent fibrotic appearing short stricture distal       sigmoid about 25 cm from the anal verge. Would not admit the adult       scope. I withdrew and obtained a 3.2 scope was able to get through it as       I did the stricture appeared to have been dilated. From the level of the       cecum and the ileocecal valve scope was slowly withdrawn all previous       meds because services were again seen.      A 5 mm polyp was found in the splenic flexure. The polyp was sessile.       The polyp was removed with  a cold snare. Resection and retrieval were       complete. Estimated blood loss was minimal.      Stricture was reinspected. This is in the vicinity of where I found       edematous mucosa on her prior CT scan suggestive of recent       diverticulitis. This did not appear to be a neoplasm. I biopsied the       mucosa and the strictured area. I also tattooed it immediately distal to       it.      The remainder of the colonic mucosa appeared normal. Normal-appearing       rectum on on face and retroflexion.. Impression:                - Diverticulosis in the sigmoid colon and in the                            descending colon. 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 Status post biopsy and tattoo                           - One 5 mm polyp at the splenic flexure, removed                            with a cold snare. Resected and retrieved.                           - fixed area of narrowing in the sigmoid may be                            contributing to her constipation. She also has                            unexplained right-sided abdominal pain which may be                            related to constipation. Moderate Sedation:      Moderate (conscious) sedation was personally administered by an       anesthesia professional. The following parameters were monitored: oxygen       saturation, heart rate, blood pressure, respiratory rate, EKG, adequacy       of pulmonary ventilation, and response to care. Recommendation:           - Patient has a contact number available for                            emergencies. The signs and symptoms of potential                            delayed complications were discussed with the                            patient. Return to normal activities tomorrow.  Written discharge instructions were provided to the                            patient.                           - Advance diet as tolerated. Continue Benefiber                            daily. Continue Trulance as needed. CT of the                            abdomen and pelvis.                           Follow-up on pathology. Further recommendations to                            follow. Procedure Code(s):        --- Professional ---                           [phone], Colonoscopy, flexible; with removal of                            tumor(s), polyp(s), or other lesion(s) by snare                            technique Diagnosis Code(s):        --- Professional  ---                           Z86.010, Personal history of colonic polyps                           K63.5, Polyp of colon                           K57.30, Diverticulosis of large intestine without                            perforation or abscess without bleeding CPT copyright 2019 American Medical Association. All rights reserved. The codes documented in this report are preliminary and upon coder review may  be revised to meet current compliance requirements. Cristopher Estimable. Gelene Recktenwald, MD Norvel Richards, MD 12/08/2021 12:04:29 PM This report has been signed electronically. Number of Addenda: 0

## 2021-12-08 NOTE — Anesthesia Postprocedure Evaluation (Signed)
Anesthesia Post Note  Patient: Emily Valentine  Procedure(s) Performed: COLONOSCOPY WITH PROPOFOL POLYPECTOMY BIOPSY SUBMUCOSAL TATTOO INJECTION  Patient location during evaluation: Phase II Anesthesia Type: General Level of consciousness: awake Pain management: pain level controlled Vital Signs Assessment: post-procedure vital signs reviewed and stable Respiratory status: spontaneous breathing and respiratory function stable Cardiovascular status: blood pressure returned to baseline and stable Postop Assessment: no headache and no apparent nausea or vomiting Anesthetic complications: no Comments: Late entry   No notable events documented.   Last Vitals:  Vitals:   12/08/21 1152 12/08/21 1159  BP: 124/83 133/80  Pulse: (!) 104 100  Resp: (!) 23 (!) 22  Temp:    SpO2: 100% 100%    Last Pain:  Vitals:   12/08/21 1159  TempSrc:   PainSc: 0-No pain                 Louann Sjogren

## 2021-12-08 NOTE — Telephone Encounter (Signed)
CT Abd/Pelvis w/wo contrast at [facility] is scheduled for 01/15/22 at 8:00 am, arrive at 7:30 am, nothing to eat or drink 4 hours prior to procedure.  Pt needs to go to [facility]iology to pick up prep  [facility]

## 2021-12-08 NOTE — Telephone Encounter (Signed)
[id number] Authorization #[id number]  Tracking [id number]

## 2021-12-08 NOTE — H&P (Signed)
$'@LOGO'X$ @   Primary Care Physician:  Sharilyn Sites, MD Primary Gastroenterologist:  Dr. Gala Romney  Pre-Procedure History & Physical: HPI:  Emily Valentine is a 71 y.o. female here for  for a surveillance colonoscopy.  Distant history of colonic adenoma.  Constipation managed with Benefiber and Trulance.    Does not Nestl take Benefiber every day but tries.  Takes Trulance intermittently.  Has a bowel movement about 3 times weekly.  Has chronic "gurgly" right-sided abdominal discomfort.  Past Medical History:  Diagnosis Date   Chronic constipation    COVID-19 11/2020   Diverticulitis 08/2016   History of echocardiogram    Echo (8/15): Vigorous LV function, EF 65-70%, normal wall motion, grade 1 diastolic dysfunction, mild to moderate TR, PASP 31 mm Hg   Hx of cardiovascular stress test    ETT-Myoview (8/15): no ischemia, EF 56%, normal study   Hypertension    Hypothyroid    Pernicious anemia    PVC's (premature ventricular contractions)    Rheumatoid arthritis ([place])    Right bundle branch block    S/P colonoscopy 2001, 2004   2001: hyperplastic polyp, 2004: normal, inflammatory polyp    Past Surgical History:  Procedure Laterality Date   ABDOMINAL HYSTERECTOMY     BACK SURGERY     BREAST BIOPSY Right 10/29/2010   BREAST BIOPSY Right 11/27/2014   BREAST EXCISIONAL BIOPSY Right 02/21/2015   BREAST LUMPECTOMY WITH RADIOACTIVE SEED LOCALIZATION Right 02/21/2015   Procedure: RIGHT BREAST LUMPECTOMY WITH RADIOACTIVE SEED LOCALIZATION;  Surgeon: Rolm Bookbinder, MD;  Location: [place];  Service: General;  Laterality: Right;   COLONOSCOPY  11/10/2010   Procedure: COLONOSCOPY;  Surgeon: Daneil Dolin, MD;  [facility];  Service: Endoscopy;  Laterality: N/A;   COLONOSCOPY N/A 09/28/2016   Dr. Gala Romney; Diverticulosis with evidence of recent diverticulitis.  Next colonoscopy in 2023 given history of tubular adenomas in the past.   THYROIDECTOMY      Prior  to Admission medications   Medication Sig Start Date End Date Taking? Authorizing Provider  acetaminophen (TYLENOL) 500 MG tablet Take 500 mg by mouth every 6 (six) hours as needed for moderate pain.   Yes [provider]  albuterol (VENTOLIN HFA) 108 (90 Base) MCG/ACT inhaler Inhale 1-2 puffs into the lungs every 6 (six) hours as needed for wheezing or shortness of breath.   Yes [provider]  aspirin EC 81 MG tablet Take 81 mg by mouth daily. Swallow whole.   Yes [provider]  Budeson-Glycopyrrol-Formoterol (BREZTRI AEROSPHERE) 160-9-4.8 MCG/ACT AERO Inhale 2 puffs into the lungs 2 (two) times daily.   Yes [provider]  calcium carbonate (TUMS - DOSED IN MG ELEMENTAL CALCIUM) 500 MG chewable tablet Chew 2 tablets by mouth daily as needed for indigestion or heartburn.   Yes [provider]  cyanocobalamin (,VITAMIN B-12,) 1000 MCG/ML injection Inject 1,000 mcg into the muscle every 30 (thirty) days. 05/30/21  Yes [provider]  hydrochlorothiazide (HYDRODIURIL) 25 MG tablet TAKE (1) TABLET BY MOUTH ONCE DAILY. 11/10/21  Yes Josue Hector, MD  hydrocortisone cream 1 % Apply 1 application topically daily as needed for itching.   Yes [provider]  lactose free nutrition (BOOST) LIQD Take 237 mLs by mouth daily.   Yes [provider]  levothyroxine (SYNTHROID, LEVOTHROID) 88 MCG tablet Take 88 mcg by mouth daily before breakfast.   Yes [provider]  losartan (COZAAR) 100 MG tablet Take 100 mg by  mouth daily. 07/23/21  Yes [provider]  metoprolol succinate (TOPROL-XL) 25 MG 24 hr tablet TAKE (1) TABLET BY MOUTH DAILY. 08/09/20  Yes Isaiah Serge, NP  Multiple Vitamin (MULTIVITAMIN WITH MINERALS) TABS tablet Take 1 tablet by mouth 2 (two) times a week.   Yes [provider]  OVER THE COUNTER MEDICATION Apply 1 Application topically daily as needed (skin chafing). [place] Antiseptic salve    Yes [provider]  polyethylene glycol-electrolytes (NULYTELY) 420 g solution As directed 10/10/21  Yes Kahleah Crass, Cristopher Estimable, MD  TRULANCE 3 MG TABS TAKE (1) TABLET BY MOUTH ONCE DAILY. 11/19/21  Yes Gabbi Whetstone, Cristopher Estimable, MD  Wheat Dextrin (BENEFIBER) POWD Take 1 Dose by mouth daily. 1 dose = 2 teaspoons   Yes [provider]  amLODipine (NORVASC) 10 MG tablet Take 1 tablet (10 mg total) by mouth daily. Patient not taking: Reported on 12/02/2021 08/28/21   Josue Hector, MD  Carboxymethylcellul-Glycerin (LUBRICATING EYE DROPS OP) Place 1 drop into both eyes daily as needed (irritation).    [provider]  ipratropium-albuterol (DUONEB) 0.5-2.5 (3) MG/3ML SOLN Take 3 mLs by nebulization every 6 (six) hours as needed. Patient not taking: Reported on 12/02/2021 08/04/21   Orson Eva, MD    Allergies as of 11/06/2021 - Review Complete 09/02/2021  Allergen Reaction Noted   Codeine Nausea Only 08/24/2016   Oxycontin [oxycodone] Nausea And Vomiting 09/01/2011    Family History  Problem Relation Age of Onset   Stomach cancer Mother        deceased   Anemia Mother    Cancer Mother    Hypertension Mother    Thyroid disease Mother    Colon cancer Father        diagnosed age 68, died at age 64   Anemia Father    Cancer Father    Hypertension Father    Anemia Sister    Cancer Brother    Diabetes Sister    Hypertension Sister    Hypertension Brother    Thyroid disease Brother    Thyroid disease Sister     Social History   Socioeconomic History   Marital status: Married    Spouse name: Not on file   Number of children: Not on file   Years of education: Not on file   Highest education level: Not on file  Occupational History   Not on file  Tobacco Use   Smoking status: Former    Years: 35.00    Types: Cigarettes   Smokeless tobacco: Former   Tobacco comments:    3-4 cigs per day 07/30/20//lmr  Vaping Use   Vaping Use: Never used  Substance and Sexual Activity    Alcohol use: No   Drug use: No   Sexual activity: Yes    Birth control/protection: Surgical  Other Topics Concern   Not on file  Social History Narrative   Not on file   Social Determinants of Health   Financial Resource Strain: Medium Risk (01/22/2020)   Overall Financial Resource Strain (CARDIA)    Difficulty of Paying Living Expenses: Somewhat hard  Food Insecurity: No Food Insecurity (01/22/2020)   Hunger Vital Sign    Worried About Running Out of Food in the Last Year: Never true    Ran Out of Food in the Last Year: Never true  Transportation Needs: No Transportation Needs (01/22/2020)   PRAPARE - Hydrologist (Medical): No    Lack of Transportation (  Non-Medical): No  Physical Activity: Insufficiently Active (01/22/2020)   Exercise Vital Sign    Days of Exercise per Week: 1 day    Minutes of Exercise per Session: 60 min  Stress: Stress Concern Present (01/22/2020)   [place]    Feeling of Stress : To some extent  Social Connections: Socially Integrated (01/22/2020)   Social Connection and Isolation Panel [NHANES]    Frequency of Communication with Friends and Family: More than three times a week    Frequency of Social Gatherings with Friends and Family: Once a week    Attends Religious Services: More than 4 times per year    Active Member of [employer] or Organizations: Yes    Attends Music therapist: More than 4 times per year    Marital Status: Married  Human resources officer Violence: Not At Risk (01/22/2020)   Humiliation, Afraid, Rape, and Kick questionnaire    Fear of Current or Ex-Partner: No    Emotionally Abused: No    Physically Abused: No    Sexually Abused: No    Review of Systems: See HPI, otherwise negative ROS  Physical Exam: BP (!) 146/97   Pulse 83   Temp 97.8 F (36.6 C) (Oral)   Resp 12   Ht '5\' 2"'$  (1.575 m)   Wt 63 kg   SpO2 96%   BMI 25.42  kg/m  General:   Alert,  Well-developed, well-nourished, pleasant and cooperative in NAD Neck:  Supple; no masses or thyromegaly. No significant cervical adenopathy. Lungs:  Clear throughout to auscultation.   No wheezes, crackles, or rhonchi. No acute distress. Heart:  Regular rate and rhythm; no murmurs, clicks, rubs,  or gallops. Abdomen: Non-distended, normal bowel sounds.  Soft and nontender without appreciable mass or hepatosplenomegaly.  Pulses:  Normal pulses noted. Extremities:  Without clubbing or edema.  Impression/Plan:    71 year old lady with history of colonic adenoma and chronic constipation here for surveillance colonoscopy.  Right-sided abdominal pain may be related to constipation.   Incidentally, did not feel like MiraLAX did much previously.  I have offered her surveillance colonoscopy today per plan.The risks, benefits, limitations, alternatives and imponderables have been reviewed with the patient. Questions have been answered. All parties are agreeable.       Notice: This dictation was prepared with Dragon dictation along with smaller phrase technology. Any transcriptional errors that result from this process are unintentional and may not be corrected upon review.

## 2021-12-09 ENCOUNTER — Encounter: Payer: Self-pay | Admitting: *Deleted

## 2021-12-10 LAB — SURGICAL PATHOLOGY

## 2021-12-14 ENCOUNTER — Encounter: Payer: Self-pay | Admitting: Internal Medicine

## 2021-12-15 ENCOUNTER — Encounter (HOSPITAL_COMMUNITY): Payer: Self-pay | Admitting: Internal Medicine

## 2021-12-22 DIAGNOSIS — I129 Hypertensive chronic kidney disease with stage 1 through stage 4 chronic kidney disease, or unspecified chronic kidney disease: Secondary | ICD-10-CM | POA: Diagnosis not present

## 2021-12-22 DIAGNOSIS — D510 Vitamin B12 deficiency anemia due to intrinsic factor deficiency: Secondary | ICD-10-CM | POA: Diagnosis not present

## 2021-12-22 DIAGNOSIS — N1832 Chronic kidney disease, stage 3b: Secondary | ICD-10-CM | POA: Diagnosis not present

## 2021-12-22 DIAGNOSIS — M069 Rheumatoid arthritis, unspecified: Secondary | ICD-10-CM | POA: Diagnosis not present

## 2021-12-22 DIAGNOSIS — R809 Proteinuria, unspecified: Secondary | ICD-10-CM | POA: Diagnosis not present

## 2021-12-22 DIAGNOSIS — R3129 Other microscopic hematuria: Secondary | ICD-10-CM | POA: Diagnosis not present

## 2021-12-22 NOTE — Telephone Encounter (Signed)
Pt informed date and time of CT, instructed to arrive at 7:30 am, npo 4 hours prior and needs to pick up prep from radiology at [facility]. Verbalized understanding.

## 2021-12-24 DIAGNOSIS — D510 Vitamin B12 deficiency anemia due to intrinsic factor deficiency: Secondary | ICD-10-CM | POA: Diagnosis not present

## 2021-12-24 DIAGNOSIS — N179 Acute kidney failure, unspecified: Secondary | ICD-10-CM | POA: Diagnosis not present

## 2021-12-24 DIAGNOSIS — E559 Vitamin D deficiency, unspecified: Secondary | ICD-10-CM | POA: Diagnosis not present

## 2021-12-24 DIAGNOSIS — E663 Overweight: Secondary | ICD-10-CM | POA: Diagnosis not present

## 2021-12-24 DIAGNOSIS — Z1331 Encounter for screening for depression: Secondary | ICD-10-CM | POA: Diagnosis not present

## 2021-12-24 DIAGNOSIS — R7309 Other abnormal glucose: Secondary | ICD-10-CM | POA: Diagnosis not present

## 2021-12-24 DIAGNOSIS — Z0001 Encounter for general adult medical examination with abnormal findings: Secondary | ICD-10-CM | POA: Diagnosis not present

## 2021-12-24 DIAGNOSIS — Z6825 Body mass index (BMI) 25.0-25.9, adult: Secondary | ICD-10-CM | POA: Diagnosis not present

## 2021-12-24 DIAGNOSIS — E039 Hypothyroidism, unspecified: Secondary | ICD-10-CM | POA: Diagnosis not present

## 2021-12-24 DIAGNOSIS — R490 Dysphonia: Secondary | ICD-10-CM | POA: Diagnosis not present

## 2022-01-15 ENCOUNTER — Ambulatory Visit (HOSPITAL_COMMUNITY)
Admission: RE | Admit: 2022-01-15 | Discharge: 2022-01-15 | Disposition: A | Discharge status: Home / Self Care | Payer: Medicare PPO | Source: Ambulatory Visit | Attending: Internal Medicine | Admitting: Internal Medicine

## 2022-01-15 DIAGNOSIS — R109 Unspecified abdominal pain: Secondary | ICD-10-CM | POA: Diagnosis not present

## 2022-01-15 DIAGNOSIS — K56699 Other intestinal obstruction unspecified as to partial versus complete obstruction: Secondary | ICD-10-CM | POA: Insufficient documentation

## 2022-01-15 DIAGNOSIS — I700 Atherosclerosis of aorta: Secondary | ICD-10-CM | POA: Diagnosis not present

## 2022-01-15 MED ORDER — IOHEXOL 300 MG/ML  SOLN
100.0000 mL | Freq: Once | INTRAMUSCULAR | Status: AC | PRN
  Administered 2022-01-15: 100 mL via INTRAVENOUS

## 2022-01-15 MED ORDER — IOHEXOL 300 MG/ML  SOLN: 80.0000 mL | Freq: Once | INTRAMUSCULAR | Status: DC | PRN

## 2022-01-26 DIAGNOSIS — Z6825 Body mass index (BMI) 25.0-25.9, adult: Secondary | ICD-10-CM | POA: Diagnosis not present

## 2022-01-26 DIAGNOSIS — H6590 Unspecified nonsuppurative otitis media, unspecified ear: Secondary | ICD-10-CM | POA: Diagnosis not present

## 2022-01-26 DIAGNOSIS — R490 Dysphonia: Secondary | ICD-10-CM | POA: Diagnosis not present

## 2022-01-29 DIAGNOSIS — R490 Dysphonia: Secondary | ICD-10-CM | POA: Diagnosis not present

## 2022-01-29 DIAGNOSIS — K219 Gastro-esophageal reflux disease without esophagitis: Secondary | ICD-10-CM | POA: Insufficient documentation

## 2022-01-29 DIAGNOSIS — H6121 Impacted cerumen, right ear: Secondary | ICD-10-CM | POA: Insufficient documentation

## 2022-02-19 ENCOUNTER — Other Ambulatory Visit: Payer: Self-pay | Admitting: Family Medicine

## 2022-02-19 DIAGNOSIS — Z1231 Encounter for screening mammogram for malignant neoplasm of breast: Secondary | ICD-10-CM

## 2022-03-14 DIAGNOSIS — J019 Acute sinusitis, unspecified: Secondary | ICD-10-CM | POA: Diagnosis not present

## 2022-03-14 DIAGNOSIS — I10 Essential (primary) hypertension: Secondary | ICD-10-CM | POA: Diagnosis not present

## 2022-03-14 DIAGNOSIS — Z6826 Body mass index (BMI) 26.0-26.9, adult: Secondary | ICD-10-CM | POA: Diagnosis not present

## 2022-03-14 DIAGNOSIS — E663 Overweight: Secondary | ICD-10-CM | POA: Diagnosis not present

## 2022-03-20 DIAGNOSIS — M069 Rheumatoid arthritis, unspecified: Secondary | ICD-10-CM | POA: Diagnosis not present

## 2022-03-20 DIAGNOSIS — J069 Acute upper respiratory infection, unspecified: Secondary | ICD-10-CM | POA: Diagnosis not present

## 2022-03-20 DIAGNOSIS — J029 Acute pharyngitis, unspecified: Secondary | ICD-10-CM | POA: Diagnosis not present

## 2022-03-20 DIAGNOSIS — J449 Chronic obstructive pulmonary disease, unspecified: Secondary | ICD-10-CM | POA: Diagnosis not present

## 2022-03-20 DIAGNOSIS — E663 Overweight: Secondary | ICD-10-CM | POA: Diagnosis not present

## 2022-03-20 DIAGNOSIS — Z6826 Body mass index (BMI) 26.0-26.9, adult: Secondary | ICD-10-CM | POA: Diagnosis not present

## 2022-03-26 DIAGNOSIS — M1990 Unspecified osteoarthritis, unspecified site: Secondary | ICD-10-CM | POA: Diagnosis not present

## 2022-03-26 DIAGNOSIS — M059 Rheumatoid arthritis with rheumatoid factor, unspecified: Secondary | ICD-10-CM | POA: Diagnosis not present

## 2022-03-26 DIAGNOSIS — M8580 Other specified disorders of bone density and structure, unspecified site: Secondary | ICD-10-CM | POA: Diagnosis not present

## 2022-03-26 DIAGNOSIS — M549 Dorsalgia, unspecified: Secondary | ICD-10-CM | POA: Diagnosis not present

## 2022-03-26 DIAGNOSIS — Z79899 Other long term (current) drug therapy: Secondary | ICD-10-CM | POA: Diagnosis not present

## 2022-03-26 DIAGNOSIS — J449 Chronic obstructive pulmonary disease, unspecified: Secondary | ICD-10-CM | POA: Diagnosis not present

## 2022-04-07 ENCOUNTER — Ambulatory Visit: Payer: Medicare PPO | Admitting: Internal Medicine

## 2022-04-08 ENCOUNTER — Ambulatory Visit
Admission: RE | Admit: 2022-04-08 | Discharge: 2022-04-08 | Disposition: A | Discharge status: Home / Self Care | Payer: Medicare PPO | Source: Ambulatory Visit | Attending: Family Medicine | Admitting: Family Medicine

## 2022-04-08 ENCOUNTER — Inpatient Hospital Stay: Admission: RE | Admit: 2022-04-08 | Payer: Medicare PPO | Source: Ambulatory Visit

## 2022-04-08 DIAGNOSIS — Z1231 Encounter for screening mammogram for malignant neoplasm of breast: Secondary | ICD-10-CM | POA: Diagnosis not present

## 2022-04-16 ENCOUNTER — Ambulatory Visit: Payer: Medicare PPO

## 2022-04-20 DIAGNOSIS — R809 Proteinuria, unspecified: Secondary | ICD-10-CM | POA: Diagnosis not present

## 2022-04-20 DIAGNOSIS — M069 Rheumatoid arthritis, unspecified: Secondary | ICD-10-CM | POA: Diagnosis not present

## 2022-04-20 DIAGNOSIS — D510 Vitamin B12 deficiency anemia due to intrinsic factor deficiency: Secondary | ICD-10-CM | POA: Diagnosis not present

## 2022-04-20 DIAGNOSIS — R3129 Other microscopic hematuria: Secondary | ICD-10-CM | POA: Diagnosis not present

## 2022-04-20 DIAGNOSIS — I129 Hypertensive chronic kidney disease with stage 1 through stage 4 chronic kidney disease, or unspecified chronic kidney disease: Secondary | ICD-10-CM | POA: Diagnosis not present

## 2022-04-20 DIAGNOSIS — N1832 Chronic kidney disease, stage 3b: Secondary | ICD-10-CM | POA: Diagnosis not present

## 2022-05-22 ENCOUNTER — Ambulatory Visit: Payer: Medicare PPO | Admitting: Internal Medicine

## 2022-05-27 DIAGNOSIS — E663 Overweight: Secondary | ICD-10-CM | POA: Diagnosis not present

## 2022-05-27 DIAGNOSIS — Z6826 Body mass index (BMI) 26.0-26.9, adult: Secondary | ICD-10-CM | POA: Diagnosis not present

## 2022-05-27 DIAGNOSIS — J449 Chronic obstructive pulmonary disease, unspecified: Secondary | ICD-10-CM | POA: Diagnosis not present

## 2022-06-09 ENCOUNTER — Ambulatory Visit: Payer: Medicare PPO | Admitting: Internal Medicine

## 2022-06-09 ENCOUNTER — Encounter: Payer: Self-pay | Admitting: Internal Medicine

## 2022-06-09 ENCOUNTER — Encounter: Payer: Self-pay | Admitting: *Deleted

## 2022-06-09 VITALS — BP 131/88 | HR 68 | Temp 97.8°F | Ht 62.0 in | Wt 146.0 lb

## 2022-06-09 DIAGNOSIS — K56699 Other intestinal obstruction unspecified as to partial versus complete obstruction: Secondary | ICD-10-CM

## 2022-06-09 MED ORDER — PANTOPRAZOLE SODIUM 40 MG PO TBEC: 40.0000 mg | DELAYED_RELEASE_TABLET | Freq: Every day | ORAL | 11 refills | Status: DC

## 2022-06-09 NOTE — Progress Notes (Signed)
Primary Care Physician:  Sharilyn Sites, MD Primary Gastroenterologist:  Dr.   Pre-Procedure History & Physical: HPI:  Emily Valentine is a 72 y.o. female here for further evaluation of constipation.  She been constipated she says for actually years and years.  Has been on Linzess failed.  Amitiza too expensive -did not work well.  Prior colonoscopy was demonstrated to sigmoid stricture biopsies negative for malignancy.  At last colonoscopy, stricture was tattooed.  Fairly extensive diverticulosis.  CT most recently also demonstrated stricture in the sigmoid.  Since I last saw this lady she presented to ENT for hoarseness.  Sounds like she had a laryngoscopy.  Diagnosed with GERD.  PPI recommended but she declined a prescription.  She does not have any typical reflux symptoms.  No dysphagia.  Past Medical History:  Diagnosis Date   Chronic constipation    COVID-19 11/2020   Diverticulitis 08/2016   History of echocardiogram    Echo (8/15): Vigorous LV function, EF 65-70%, normal wall motion, grade 1 diastolic dysfunction, mild to moderate TR, PASP 31 mm Hg   Hx of cardiovascular stress test    ETT-Myoview (8/15): no ischemia, EF 56%, normal study   Hypertension    Hypothyroid    Pernicious anemia    PVC's (premature ventricular contractions)    Rheumatoid arthritis ([place])    Right bundle branch block    S/P colonoscopy 2001, 2004   2001: hyperplastic polyp, 2004: normal, inflammatory polyp    Past Surgical History:  Procedure Laterality Date   ABDOMINAL HYSTERECTOMY     BACK SURGERY     BIOPSY  12/08/2021   Procedure: BIOPSY;  Surgeon: Daneil Dolin, MD;  [facility]: [facility];  Service: Endoscopy;;   BREAST BIOPSY Right 10/29/2010   BREAST BIOPSY Right 11/27/2014   BREAST EXCISIONAL BIOPSY Right 02/21/2015   BREAST LUMPECTOMY WITH RADIOACTIVE SEED LOCALIZATION Right 02/21/2015   Procedure: RIGHT BREAST LUMPECTOMY WITH RADIOACTIVE SEED LOCALIZATION;  Surgeon: Rolm Bookbinder, MD;  Location: [place];  Service: General;  Laterality: Right;   COLONOSCOPY  11/10/2010   Procedure: COLONOSCOPY;  Surgeon: Daneil Dolin, MD;  Location: [facility];  Service: Endoscopy;  Laterality: N/A;   COLONOSCOPY N/A 09/28/2016   Dr. Gala Romney; Diverticulosis with evidence of recent diverticulitis.  Next colonoscopy in 2023 given history of tubular adenomas in the past.   COLONOSCOPY WITH PROPOFOL N/A 12/08/2021   Procedure: COLONOSCOPY WITH PROPOFOL;  Surgeon: Daneil Dolin, MD;  [facility]: [facility];  Service: Endoscopy;  Laterality: N/A;  11:00am   POLYPECTOMY  12/08/2021   Procedure: POLYPECTOMY;  Surgeon: Daneil Dolin, MD;  [facility]: [facility];  Service: Endoscopy;;   SUBMUCOSAL TATTOO INJECTION  12/08/2021   Procedure: SUBMUCOSAL TATTOO INJECTION;  Surgeon: Daneil Dolin, MD;  Location: [facility];  Service: Endoscopy;;   THYROIDECTOMY      Prior to Admission medications   Medication Sig Start Date End Date Taking? Authorizing Provider  acetaminophen (TYLENOL) 500 MG tablet Take 500 mg by mouth every 6 (six) hours as needed for moderate pain.   Yes [provider]  albuterol (VENTOLIN HFA) 108 (90 Base) MCG/ACT inhaler Inhale 1-2 puffs into the lungs every 6 (six) hours as needed for wheezing or shortness of breath.   Yes [provider]  aspirin EC 81 MG tablet Take 81 mg by mouth daily. Swallow whole.   Yes [provider]  Budeson-Glycopyrrol-Formoterol (BREZTRI AEROSPHERE) 160-9-4.8 MCG/ACT AERO  Inhale 2 puffs into the lungs 2 (two) times daily.   Yes [provider]  calcium carbonate (TUMS - DOSED IN MG ELEMENTAL CALCIUM) 500 MG chewable tablet Chew 2 tablets by mouth daily as needed for indigestion or heartburn.   Yes [provider]  cyanocobalamin (,VITAMIN B-12,) 1000 MCG/ML injection Inject 1,000 mcg into the muscle every 30 (thirty) days. 05/30/21  Yes [provider]  folic  acid (FOLVITE) 1 MG tablet Take 1 mg by mouth daily. 03/26/22  Yes [provider]  hydrochlorothiazide (HYDRODIURIL) 25 MG tablet TAKE (1) TABLET BY MOUTH ONCE DAILY. 11/10/21  Yes Josue Hector, MD  levothyroxine (SYNTHROID, LEVOTHROID) 88 MCG tablet Take 88 mcg by mouth daily before breakfast.   Yes [provider]  losartan (COZAAR) 100 MG tablet Take 100 mg by mouth daily. 07/23/21  Yes [provider]  methotrexate (RHEUMATREX) 2.5 MG tablet Take 10 mg by mouth once a week. 05/18/22  Yes [provider]  metoprolol succinate (TOPROL-XL) 25 MG 24 hr tablet TAKE (1) TABLET BY MOUTH DAILY. 08/09/20  Yes Isaiah Serge, NP  Multiple Vitamin (MULTIVITAMIN WITH MINERALS) TABS tablet Take 1 tablet by mouth 2 (two) times a week.   Yes [provider]  TRULANCE 3 MG TABS TAKE (1) TABLET BY MOUTH ONCE DAILY. 11/19/21  Yes Jimia Gentles, Cristopher Estimable, MD  Wheat Dextrin (BENEFIBER) POWD Take 1 Dose by mouth daily. 1 dose = 2 teaspoons   Yes [provider]    Allergies as of 06/09/2022 - Review Complete 06/09/2022  Allergen Reaction Noted   Codeine Nausea Only 08/24/2016   Oxycontin [oxycodone] Nausea And Vomiting 09/01/2011    Family History  Problem Relation Age of Onset   Stomach cancer Mother        deceased   Anemia Mother    Cancer Mother    Hypertension Mother    Thyroid disease Mother    Colon cancer Father        diagnosed age 27, died at age 73   Anemia Father    Cancer Father    Hypertension Father    Anemia Sister    Cancer Brother    Diabetes Sister    Hypertension Sister    Hypertension Brother    Thyroid disease Brother    Thyroid disease Sister     Social History   Socioeconomic History   Marital status: Widowed    Spouse name: Not on file   Number of children: Not on file   Years of education: Not on file   Highest education level: Not on file  Occupational History   Not on file  Tobacco Use   Smoking status: Former     Years: 35    Types: Cigarettes   Smokeless tobacco: Former   Tobacco comments:    3-4 cigs per day 07/30/20//lmr  Vaping Use   Vaping Use: Never used  Substance and Sexual Activity   Alcohol use: No   Drug use: No   Sexual activity: Yes    Birth control/protection: Surgical  Other Topics Concern   Not on file  Social History Narrative   Not on file   Social Determinants of Health   Financial Resource Strain: Medium Risk (01/22/2020)   Overall Financial Resource Strain (CARDIA)    Difficulty of Paying Living Expenses: Somewhat hard  Food Insecurity: No Food Insecurity (01/22/2020)   Hunger Vital Sign    Worried About Running Out of Food in the Last  Year: Never true    [place] in the Last Year: Never true  Transportation Needs: No Transportation Needs (01/22/2020)   PRAPARE - Hydrologist (Medical): No    Lack of Transportation (Non-Medical): No  Physical Activity: Insufficiently Active (01/22/2020)   Exercise Vital Sign    Days of Exercise per Week: 1 day    Minutes of Exercise per Session: 60 min  Stress: Stress Concern Present (01/22/2020)   [place]    Feeling of Stress : To some extent  Social Connections: Socially Integrated (01/22/2020)   Social Connection and Isolation Panel [NHANES]    Frequency of Communication with Friends and Family: More than three times a week    Frequency of Social Gatherings with Friends and Family: Once a week    Attends Religious Services: More than 4 times per year    Active Member of [employer] or Organizations: Yes    Attends Music therapist: More than 4 times per year    Marital Status: Married  Human resources officer Violence: Not At Risk (01/22/2020)   Humiliation, Afraid, Rape, and Kick questionnaire    Fear of Current or Ex-Partner: No    Emotionally Abused: No    Physically Abused: No    Sexually Abused: No    Review of  Systems: See HPI, otherwise negative ROS  Physical Exam: BP 131/88 (BP Location: Right Arm, Patient Position: Sitting, Cuff Size: Normal)   Pulse 68   Temp 97.8 F (36.6 C) (Oral)   Ht 5\' 2"  (1.575 m)   Wt 146 lb (66.2 kg)   SpO2 99%   BMI 26.70 kg/m  General:   Alert,  Well-developed, well-nourished, pleasant and cooperative in NAD Neck:  Supple; no masses or thyromegaly. No significant cervical adenopathy. Lungs:  Clear throughout to auscultation.   No wheezes, crackles, or rhonchi. No acute distress. Heart:  Regular rate and rhythm; no murmurs, clicks, rubs,  or gallops. Abdomen: Non-distended, normal bowel sounds.  Soft and nontender without appreciable mass or hepatosplenomegaly.    Impression/Plan: Chronic constipation.  Stricturing noted on colonoscopy times X2 and CT x 1.  Biopsies are negative.  The stricture was inked at prior colonoscopy I suspect her chronic constipation which is somewhat refractory may in part, be related to the sigmoid stricture.  Would recommend further evaluation with barium enema with eventual surgical referral for elective resection.  Hoarseness diagnosed by ENT.  This may or may not have anything to do reflux.  She was offered a PPI but declined.  I explained to her the tensional atypical manifestations of GERD.  She would like to try PPI.  She understands it may take 3 to 6 months of therapy to derive any improvement if hoarseness is related to reflux.    Recommendations  Air-contrast barium enema further characterize stricture.  Further recommendations to follow after barium enema has been performed.  I anticipate surgical referral in the near future.  She has seen Dr. Rolm Bookbinder previously.  GERD information provided  Begin pantoprazole or Protonix 40 mg daily take 30 minutes before lunch (dispense 30 with 11 refills)  Office visit here in 4 months     Notice: This dictation was prepared with Dragon dictation along with smaller  phrase technology. Any transcriptional errors that result from this process are unintentional and may not be corrected upon review.

## 2022-06-09 NOTE — Patient Instructions (Signed)
It was good to see you again today!  As discussed, you do have a narrowing in the left side of your colon.  Would like to further characterize this with a x-ray called a barium enema.  This stricture is likely causing constipation.  May need to have this part of your colon removed.  Further recommendations to follow after barium enema has been performed  GERD information provided  Begin pantoprazole or Protonix 40 mg daily take 30 minutes before lunch (dispense 30 with 11 refills)  This may or may not be related to acid reflux.  If it is, it may take 3 to 6 months to see any improvement on antiacid medicine (Protonix).  Office visit here in 4 months

## 2022-06-19 NOTE — Congregational Nurse Program (Unsigned)
[name] has ordered's from PCP to 1 ml Vit B12 monthly.  I administered the dosage in her Left cheek of her buttock.

## 2022-06-22 ENCOUNTER — Ambulatory Visit (HOSPITAL_COMMUNITY)
Admission: RE | Admit: 2022-06-22 | Discharge: 2022-06-22 | Disposition: A | Discharge status: Home / Self Care | Payer: Medicare PPO | Source: Ambulatory Visit | Attending: Internal Medicine | Admitting: Internal Medicine

## 2022-06-22 DIAGNOSIS — K2289 Other specified disease of esophagus: Secondary | ICD-10-CM | POA: Diagnosis not present

## 2022-06-22 DIAGNOSIS — K5669 Other partial intestinal obstruction: Secondary | ICD-10-CM | POA: Diagnosis not present

## 2022-06-22 DIAGNOSIS — K56699 Other intestinal obstruction unspecified as to partial versus complete obstruction: Secondary | ICD-10-CM | POA: Diagnosis not present

## 2022-06-25 ENCOUNTER — Other Ambulatory Visit: Payer: Self-pay | Admitting: *Deleted

## 2022-06-25 DIAGNOSIS — K56699 Other intestinal obstruction unspecified as to partial versus complete obstruction: Secondary | ICD-10-CM

## 2022-06-30 DIAGNOSIS — N289 Disorder of kidney and ureter, unspecified: Secondary | ICD-10-CM | POA: Diagnosis not present

## 2022-06-30 DIAGNOSIS — M549 Dorsalgia, unspecified: Secondary | ICD-10-CM | POA: Diagnosis not present

## 2022-06-30 DIAGNOSIS — M1990 Unspecified osteoarthritis, unspecified site: Secondary | ICD-10-CM | POA: Diagnosis not present

## 2022-06-30 DIAGNOSIS — J449 Chronic obstructive pulmonary disease, unspecified: Secondary | ICD-10-CM | POA: Diagnosis not present

## 2022-06-30 DIAGNOSIS — M8580 Other specified disorders of bone density and structure, unspecified site: Secondary | ICD-10-CM | POA: Diagnosis not present

## 2022-06-30 DIAGNOSIS — M059 Rheumatoid arthritis with rheumatoid factor, unspecified: Secondary | ICD-10-CM | POA: Diagnosis not present

## 2022-06-30 DIAGNOSIS — Z79899 Other long term (current) drug therapy: Secondary | ICD-10-CM | POA: Diagnosis not present

## 2022-07-03 DIAGNOSIS — M0579 Rheumatoid arthritis with rheumatoid factor of multiple sites without organ or systems involvement: Secondary | ICD-10-CM | POA: Diagnosis not present

## 2022-07-17 DIAGNOSIS — Z6825 Body mass index (BMI) 25.0-25.9, adult: Secondary | ICD-10-CM | POA: Diagnosis not present

## 2022-07-17 DIAGNOSIS — J449 Chronic obstructive pulmonary disease, unspecified: Secondary | ICD-10-CM | POA: Diagnosis not present

## 2022-07-22 ENCOUNTER — Ambulatory Visit: Payer: Self-pay | Admitting: Surgery

## 2022-07-22 DIAGNOSIS — I4510 Unspecified right bundle-branch block: Secondary | ICD-10-CM | POA: Diagnosis not present

## 2022-07-22 DIAGNOSIS — Z8719 Personal history of other diseases of the digestive system: Secondary | ICD-10-CM | POA: Diagnosis not present

## 2022-07-22 DIAGNOSIS — R739 Hyperglycemia, unspecified: Secondary | ICD-10-CM

## 2022-07-22 DIAGNOSIS — K56699 Other intestinal obstruction unspecified as to partial versus complete obstruction: Secondary | ICD-10-CM | POA: Diagnosis not present

## 2022-07-22 DIAGNOSIS — K5909 Other constipation: Secondary | ICD-10-CM | POA: Diagnosis not present

## 2022-07-30 ENCOUNTER — Ambulatory Visit: Payer: Medicare PPO | Admitting: Podiatry

## 2022-07-30 DIAGNOSIS — L603 Nail dystrophy: Secondary | ICD-10-CM | POA: Diagnosis not present

## 2022-07-30 DIAGNOSIS — L609 Nail disorder, unspecified: Secondary | ICD-10-CM | POA: Diagnosis not present

## 2022-08-03 NOTE — Progress Notes (Signed)
  Subjective:  Patient ID: Emily Valentine, female    DOB: 06/05/1950,  MRN: [id number]  Chief Complaint  Patient presents with   Nail Problem    NP- R foot 2nd toe nail fungus    72 y.o. female presents with the above complaint. History confirmed with patient.  She is concerned about the appearance and wonders if it might be fungus  Objective:  Physical Exam: warm, good capillary refill, no trophic changes or ulcerative lesions, normal DP and PT pulses, normal sensory exam, and bilateral second toe hypertrophic nail with subungual debris, pincer nail deformity, underlying skin  Assessment:   1. Nail dystrophy      Plan:  Patient was evaluated and treated and all questions answered.  Discussed nail dystrophy and possibility of nail fungus.  Recommended culture of the nail plate.  Sample taken and sent to [facility] for analysis.  I will let her know what the results of this show and discuss further treatment options once we have the results.  Return if symptoms worsen or fail to improve.

## 2022-08-04 ENCOUNTER — Telehealth: Payer: Self-pay

## 2022-08-04 ENCOUNTER — Encounter: Payer: Self-pay | Admitting: Cardiovascular Disease

## 2022-08-04 NOTE — Telephone Encounter (Signed)
  Patient Consent for Virtual Visit        Emily Valentine has provided verbal consent on 08/04/2022 for a virtual visit (video or telephone).   CONSENT FOR VIRTUAL VISIT FOR:  Emily Valentine  By participating in this virtual visit I agree to the following:  I hereby voluntarily request, consent and authorize [facility] and its employed or contracted physicians, physician assistants, nurse practitioners or other licensed health care professionals (the Practitioner), to provide me with telemedicine health care services (the "Services") as deemed necessary by the treating Practitioner. I acknowledge and consent to receive the Services by the Practitioner via telemedicine. I understand that the telemedicine visit will involve communicating with the Practitioner through live audiovisual communication technology and the disclosure of certain medical information by electronic transmission. I acknowledge that I have been given the opportunity to request an in-person assessment or other available alternative prior to the telemedicine visit and am voluntarily participating in the telemedicine visit.  I understand that I have the right to withhold or withdraw my consent to the use of telemedicine in the course of my care at any time, without affecting my right to future care or treatment, and that the Practitioner or I may terminate the telemedicine visit at any time. I understand that I have the right to inspect all information obtained and/or recorded in the course of the telemedicine visit and may receive copies of available information for a reasonable fee.  I understand that some of the potential risks of receiving the Services via telemedicine include:  Delay or interruption in medical evaluation due to technological equipment failure or disruption; Information transmitted may not be sufficient (e.g. poor resolution of images) to allow for appropriate medical decision making by the  Practitioner; and/or  In rare instances, security protocols could fail, causing a breach of personal health information.  Furthermore, I acknowledge that it is my responsibility to provide information about my medical history, conditions and care that is complete and accurate to the best of my ability. I acknowledge that Practitioner's advice, recommendations, and/or decision may be based on factors not within their control, such as incomplete or inaccurate data provided by me or distortions of diagnostic images or specimens that may result from electronic transmissions. I understand that the practice of medicine is not an exact science and that Practitioner makes no warranties or guarantees regarding treatment outcomes. I acknowledge that a copy of this consent can be made available to me via my patient portal [facility] MyChart), or I can request a printed copy by calling the office of [facility].    I understand that my insurance will be billed for this visit.   I have read or had this consent read to me. I understand the contents of this consent, which adequately explains the benefits and risks of the Services being provided via telemedicine.  I have been provided ample opportunity to ask questions regarding this consent and the Services and have had my questions answered to my satisfaction. I give my informed consent for the services to be provided through the use of telemedicine in my medical care

## 2022-08-04 NOTE — Telephone Encounter (Signed)
Spoke with patient who is agreeable to do a tele visit on 5/23 for pre-op clearance. Med rec and consent done.

## 2022-08-04 NOTE — Telephone Encounter (Signed)
Error

## 2022-08-04 NOTE — Telephone Encounter (Signed)
   Name: Emily Valentine  DOB: 06/18/1950  MRN: [id number]  Primary Cardiologist: Charlton Haws, MD   Preoperative team, please contact this patient and set up a phone call appointment for further preoperative risk assessment.  Patient has not has appointment scheduled with Eligha Bridegroom, NP, on 08/12/2022, however, this appears to be the same date as her upcoming surgery.  If surgery is scheduled for a later date, okay to keep office appointment with Eligha Bridegroom, NP (and no need for tele visit).  Otherwise, would schedule televisit for preop clearance.  Please obtain consent and complete medication review. Thank you for your help.  I confirm that guidance regarding antiplatelet and oral anticoagulation therapy has been completed and, if necessary, noted below.  Per office protocol, if patient is without any new symptoms or concerns at the time of their virtual visit, she may hold Aspirin for 5-7 days prior to procedure. Please resume Aspirin as soon as possible postprocedure, at the discretion of the surgeon.     Joylene Grapes, NP 08/04/2022, 9:39 AM  [facility]

## 2022-08-04 NOTE — Telephone Encounter (Signed)
   Pre-operative Risk Assessment    Patient Name: Emily Valentine  DOB: 08/09/1950 MRN: [id number]      Request for Surgical Clearance    Procedure:   robotic sigmoid colectomy   Date of Surgery:  Clearance 08/12/22                                 Surgeon:  DR. Karie Soda Surgeon's Group or Practice Name:  [facility] Phone number:  [phone] Fax number:  [phone]   Type of Clearance Requested:   - Medical  - Pharmacy:  Hold Aspirin NEEDS INSTRUCTIONS WHEN TO HOLD     Type of Anesthesia:  General    Additional requests/questions:    SignedMichaelle Copas   08/04/2022, 8:34 AM

## 2022-08-04 NOTE — Telephone Encounter (Signed)
I left a message for the patient to call our office back to schedule a tele visit for pre-op.  

## 2022-08-05 NOTE — Progress Notes (Unsigned)
Virtual Visit via Telephone Note   Because of Emily Valentine's co-morbid illnesses, she is at least at moderate risk for complications without adequate follow up.  This format is felt to be most appropriate for this patient at this time.  The patient did not have access to video technology/had technical difficulties with video requiring transitioning to audio format only (telephone).  All issues noted in this document were discussed and addressed.  No physical exam could be performed with this format.  Please refer to the patient's chart for her consent to telehealth for [facility].  Evaluation Performed:  Preoperative cardiovascular risk assessment _____________   Date:  08/05/2022   Patient ID:  Emily Valentine, DOB 1951-03-11, MRN [id number] Patient Location:  Home Provider location:   Office  Primary Care Provider:  Assunta Found, MD Primary Cardiologist:  Charlton Haws, MD  Chief Complaint / Patient Profile   72 y.o. y/o female with a h/o RBBB, HTN, frequent PVCs, COPD Gold 2, tobacco abuse, aortic atherosclerosis, nonobstructive CAD, RA who is pending robotic sigmoid colectomy and presents today for telephonic preoperative cardiovascular risk assessment.  History of Present Illness    Emily Valentine is a 72 y.o. female who presents via audio/video conferencing for a telehealth visit today.  Pt was last seen in cardiology clinic on 08/28/2021 by Dr.Nishan  At that time Harlyn L Beltran was doing well with no new cardiac complaints however blood pressure was elevated and patient was recovering from influenza.The patient is now pending procedure as outlined above. Since her last visit, she reports she has been doing well with no new cardiac complaints.  She is currently not checking her blood pressure and most recent pressure in our system shows systolic in the 130s.  She denies chest pain, shortness of breath, lower extremity edema, fatigue, palpitations, melena, hematuria,  hemoptysis, diaphoresis, weakness, presyncope, syncope, orthopnea, and PND.   Past Medical History    Past Medical History:  Diagnosis Date   Chronic constipation    COVID-19 11/2020   Diverticulitis 08/2016   History of echocardiogram    Echo (8/15): Vigorous LV function, EF 65-70%, normal wall motion, grade 1 diastolic dysfunction, mild to moderate TR, PASP 31 mm Hg   Hx of cardiovascular stress test    ETT-Myoview (8/15): no ischemia, EF 56%, normal study   Hypertension    Hypothyroid    Pernicious anemia    PVC's (premature ventricular contractions)    Rheumatoid arthritis (HCC)    Right bundle branch block    S/P colonoscopy 2001, 2004   2001: hyperplastic polyp, 2004: normal, inflammatory polyp   Past Surgical History:  Procedure Laterality Date   ABDOMINAL HYSTERECTOMY     BACK SURGERY     BIOPSY  12/08/2021   Procedure: BIOPSY;  Surgeon: Corbin Ade, MD;  [facility];  Service: Endoscopy;;   BREAST BIOPSY Right 10/29/2010   BREAST BIOPSY Right 11/27/2014   BREAST EXCISIONAL BIOPSY Right 02/21/2015   BREAST LUMPECTOMY WITH RADIOACTIVE SEED LOCALIZATION Right 02/21/2015   Procedure: RIGHT BREAST LUMPECTOMY WITH RADIOACTIVE SEED LOCALIZATION;  Surgeon: Emelia Loron, MD;  Location: [facility];  Service: General;  Laterality: Right;   COLONOSCOPY  11/10/2010   Procedure: COLONOSCOPY;  Surgeon: Corbin Ade, MD;  [facility];  Service: Endoscopy;  Laterality: N/A;   COLONOSCOPY N/A 09/28/2016   Dr. Jena Gauss; Diverticulosis with evidence of recent diverticulitis.  Next colonoscopy in 2023 given history of tubular  adenomas in the past.   COLONOSCOPY WITH PROPOFOL N/A 12/08/2021   Procedure: COLONOSCOPY WITH PROPOFOL;  Surgeon: Corbin Ade, MD;  [facility];  Service: Endoscopy;  Laterality: N/A;  11:00am   POLYPECTOMY  12/08/2021   Procedure: POLYPECTOMY;  Surgeon: Corbin Ade, MD;  [facility];   Service: Endoscopy;;   SUBMUCOSAL TATTOO INJECTION  12/08/2021   Procedure: SUBMUCOSAL TATTOO INJECTION;  Surgeon: Corbin Ade, MD;  [facility];  Service: Endoscopy;;   THYROIDECTOMY      Allergies  Allergies  Allergen Reactions   Codeine Nausea Only   Oxycontin [Oxycodone] Nausea And Vomiting    Home Medications    Prior to Admission medications   Medication Sig Start Date End Date Taking? Authorizing Provider  acetaminophen (TYLENOL) 500 MG tablet Take 500 mg by mouth every 6 (six) hours as needed for moderate pain.    [provider]  albuterol (VENTOLIN HFA) 108 (90 Base) MCG/ACT inhaler Inhale 1-2 puffs into the lungs every 6 (six) hours as needed for wheezing or shortness of breath.    [provider]  aspirin EC 81 MG tablet Take 81 mg by mouth daily. Swallow whole.    [provider]  Budeson-Glycopyrrol-Formoterol (BREZTRI AEROSPHERE) 160-9-4.8 MCG/ACT AERO Inhale 2 puffs into the lungs 2 (two) times daily.    [provider]  calcium carbonate (TUMS - DOSED IN MG ELEMENTAL CALCIUM) 500 MG chewable tablet Chew 2 tablets by mouth daily as needed for indigestion or heartburn.    [provider]  cyanocobalamin (,VITAMIN B-12,) 1000 MCG/ML injection Inject 1,000 mcg into the muscle every 30 (thirty) days. 05/30/21   [provider]  folic acid (FOLVITE) 1 MG tablet Take 1 mg by mouth daily. 03/26/22   [provider]  hydrochlorothiazide (HYDRODIURIL) 25 MG tablet TAKE (1) TABLET BY MOUTH ONCE DAILY. 11/10/21   Wendall Stade, MD  levothyroxine (SYNTHROID, LEVOTHROID) 88 MCG tablet Take 88 mcg by mouth daily before breakfast.    [provider]  losartan (COZAAR) 100 MG tablet Take 100 mg by mouth daily. 07/23/21   [provider]  methotrexate (RHEUMATREX) 2.5 MG tablet Take 10 mg by mouth once a week. 05/18/22   [provider]  metoprolol succinate (TOPROL-XL) 25 MG 24 hr tablet  TAKE (1) TABLET BY MOUTH DAILY. 08/09/20   Leone Brand, NP  Multiple Vitamin (MULTIVITAMIN WITH MINERALS) TABS tablet Take 1 tablet by mouth 2 (two) times a week.    [provider]  pantoprazole (PROTONIX) 40 MG tablet Take 1 tablet (40 mg total) by mouth daily. 06/09/22   Rourk, Gerrit Friends, MD  TRULANCE 3 MG TABS TAKE (1) TABLET BY MOUTH ONCE DAILY. 11/19/21   Rourk, Gerrit Friends, MD  Wheat Dextrin (BENEFIBER) POWD Take 1 Dose by mouth daily. 1 dose = 2 teaspoons    [provider]    Physical Exam    Vital Signs:  Lakshmi L Khachatryan does not have vital signs available for review today.  Given telephonic nature of communication, physical exam is limited. AAOx3. NAD. Normal affect.  Speech and respirations are unlabored.  Accessory Clinical Findings    None  Assessment & Plan    1.  Preoperative Cardiovascular Risk Assessment: -Patient's RCRI score is 0.9%  The patient affirms she has been doing well without any new cardiac symptoms. They are able to achieve 4 METS without cardiac limitations. Therefore, based on ACC/AHA guidelines, the  patient would be at acceptable risk for the planned procedure without further cardiovascular testing. The patient was advised that if she develops new symptoms prior to surgery to contact our office to arrange for a follow-up visit, and she verbalized understanding.  The patient was advised that if she develops new symptoms prior to surgery to contact our office to arrange for a follow-up visit, and she verbalized understanding.  Regarding ASA therapy, we recommend it may be stopped 5-7 days prior to surgery with a plan to resume it as soon as felt to be feasible from a surgical standpoint in the post-operative period.   A copy of this note will be routed to requesting surgeon.  Time:   Today, I have spent 8 minutes with the patient with telehealth technology discussing medical history, symptoms, and management plan.     Napoleon Form, Leodis Rains, NP  08/05/2022, 3:18 PM

## 2022-08-06 ENCOUNTER — Ambulatory Visit: Payer: Medicare PPO | Attending: Interventional Cardiology

## 2022-08-06 DIAGNOSIS — Z01810 Encounter for preprocedural cardiovascular examination: Secondary | ICD-10-CM

## 2022-08-07 ENCOUNTER — Telehealth: Payer: Self-pay | Admitting: Cardiovascular Disease

## 2022-08-07 NOTE — Telephone Encounter (Signed)
See previous encounters:  Patient states Dr. Michaell Cowing' office is closed today, but she would like a call back to discuss any procedure instructions that our office has, regarding medications. She says she has several questions and she needs to know what to tell her children, but unfortunately Dr. Michaell Cowing is unavailable. Please advise.

## 2022-08-07 NOTE — Telephone Encounter (Signed)
I spoke with patient. She had questions that needs to be answered by surgeons office. I told her that she would need to speak with them. We did review holding her ASA. She states she will call surgeons office Tuesday.

## 2022-08-11 ENCOUNTER — Encounter: Payer: Self-pay | Admitting: Podiatry

## 2022-08-12 ENCOUNTER — Ambulatory Visit: Payer: Medicare PPO | Admitting: Nurse Practitioner

## 2022-08-13 ENCOUNTER — Encounter (HOSPITAL_COMMUNITY): Payer: Self-pay

## 2022-08-14 NOTE — Patient Instructions (Addendum)
SURGICAL WAITING ROOM VISITATION Patients having surgery or a procedure may have no more than 2 support people in the waiting area - these visitors may rotate.    Children under the age of 34 must have an adult with them who is not the patient.  If the patient needs to stay at the hospital during part of their recovery, the visitor guidelines for inpatient rooms apply. Pre-op nurse will coordinate an appropriate time for 1 support person to accompany patient in pre-op.  This support person may not rotate.    Please refer to the [facility] website for the visitor guidelines for Inpatients (after your surgery is over and you are in a regular room).       Your procedure is scheduled on: 09-03-22   Report to [facility] Main Entrance    Report to admitting at 10:45 AM   Call this number if you have problems the morning of surgery [phone]   Follow a clear liquid diet day of prep to prevent dehydration   Do not eat food :After Midnight.   After Midnight you may have the following liquids until 10:00 AM DAY OF SURGERY  Water Non-Citrus Juices (without pulp, NO RED-Apple, White grape, White cranberry) Black Coffee (NO MILK/CREAM OR CREAMERS, sugar ok)  Clear Tea (NO MILK/CREAM OR CREAMERS, sugar ok) regular and decaf                             Plain Jell-O (NO RED)                                           Fruit ices (not with fruit pulp, NO RED)                                     Popsicles (NO RED)                                                               Sports drinks like Gatorade (NO RED)  Drink 2 Pre-surgery Ensure the evening before surgery (complete by 10 PM)                   The day of surgery:  Drink ONE (1) Pre-Surgery Clear Ensure at 10:00 AM the morning of surgery. Drink in one sitting. Do not sip.  This drink was given to you during your hospital  pre-op appointment visit. Nothing else to drink after completing the Pre-Surgery Clear Ensure or G2.           If you have questions, please contact your surgeon's office.   FOLLOW BOWEL PREP AND ANY ADDITIONAL PRE OP INSTRUCTIONS YOU RECEIVED FROM YOUR SURGEON'S OFFICE!!!    -Clear liquids day of prep to prevent dehydration  - Bisacodyl (Dulcolax) 20 mg - Give with water the day prior to surgery.   - Miralax 255g - Mix with 64 oz Gatorade/Powerade.  Drink gradually over the next few hours (8 oz glass every 15-30 minutes) until gone the day prior to surgery.   -Metronidazole (  Flagyl) 1000 mg - At 2 pm, 3 pm and 10 pm after Miralax bowel prep the day prior to surgery.       -Neomycin 1000 mg - At 2 pm, 3 pm and 10 pm after Miralax  bowel prep the day prior to surgery.   Oral Hygiene is also important to reduce your risk of infection.                                    Remember - BRUSH YOUR TEETH THE MORNING OF SURGERY WITH YOUR REGULAR TOOTHPASTE   Do NOT smoke after Midnight   Take these medicines the morning of surgery:   Levothyroxine  Metoprolol  Tylenol if needed                              You may not have any metal on your body including hair pins, jewelry, and body piercing             Do not wear make-up, lotions, powders, perfumes or deodorant  Do not wear nail polish including gel and S&S, artificial/acrylic nails, or any other type of covering on natural nails including finger and toenails. If you have artificial nails, gel coating, etc. that needs to be removed by a nail salon please have this removed prior to surgery or surgery may need to be canceled/ delayed if the surgeon/ anesthesia feels like they are unable to be safely monitored.   Do not shave  48 hours prior to surgery.    Do not bring valuables to the hospital. [facility] IS NOT RESPONSIBLE   FOR VALUABLES.   Contacts, dentures or bridgework may not be worn into surgery.   Bring small overnight bag day of surgery.   DO NOT BRING YOUR HOME MEDICATIONS TO THE HOSPITAL. PHARMACY WILL DISPENSE MEDICATIONS  LISTED ON YOUR MEDICATION LIST TO YOU DURING YOUR ADMISSION IN THE HOSPITAL!              Please read over the following fact sheets you were given: IF YOU HAVE QUESTIONS ABOUT YOUR PRE-OP INSTRUCTIONS PLEASE CALL [phone] Gwen  If you received a COVID test during your pre-op visit  it is requested that you wear a mask when out in public, stay away from anyone that may not be feeling well and notify your surgeon if you develop symptoms. If you test positive for Covid or have been in contact with anyone that has tested positive in the last 10 days please notify you surgeon.  [facility] - Preparing for Surgery Before surgery, you can play an important role.  Because skin is not sterile, your skin needs to be as free of germs as possible.  You can reduce the number of germs on your skin by washing with CHG (chlorahexidine gluconate) soap before surgery.  CHG is an antiseptic cleaner which kills germs and bonds with the skin to continue killing germs even after washing. Please DO NOT use if you have an allergy to CHG or antibacterial soaps.  If your skin becomes reddened/irritated stop using the CHG and inform your nurse when you arrive at Short Stay. Do not shave (including legs and underarms) for at least 48 hours prior to the first CHG shower.  You may shave your face/neck.  Please follow these instructions carefully:  1.  Shower with CHG Soap the night  before surgery and the  morning of surgery.  2.  If you choose to wash your hair, wash your hair first as usual with your normal  shampoo.  3.  After you shampoo, rinse your hair and body thoroughly to remove the shampoo.                             4.  Use CHG as you would any other liquid soap.  You can apply chg directly to the skin and wash.  Gently with a scrungie or clean washcloth.  5.  Apply the CHG Soap to your body ONLY FROM THE NECK DOWN.   Do   not use on face/ open                           Wound or open sores. Avoid contact with  eyes, ears mouth and   genitals (private parts).                       Wash face,  Genitals (private parts) with your normal soap.             6.  Wash thoroughly, paying special attention to the area where your    surgery  will be performed.  7.  Thoroughly rinse your body with warm water from the neck down.  8.  DO NOT shower/wash with your normal soap after using and rinsing off the CHG Soap.                9.  Pat yourself dry with a clean towel.            10.  Wear clean pajamas.            11.  Place clean sheets on your bed the night of your first shower and do not  sleep with pets. Day of Surgery : Do not apply any lotions/deodorants the morning of surgery.  Please wear clean clothes to the hospital/surgery center.  FAILURE TO FOLLOW THESE INSTRUCTIONS MAY RESULT IN THE CANCELLATION OF YOUR SURGERY  PATIENT SIGNATURE_________________________________  NURSE SIGNATURE__________________________________  ________________________________________________________________________    Emily Valentine  An incentive spirometer is a tool that can help keep your lungs clear and active. This tool measures how well you are filling your lungs with each breath. Taking long deep breaths may help reverse or decrease the chance of developing breathing (pulmonary) problems (especially infection) following: A long period of time when you are unable to move or be active. BEFORE THE PROCEDURE  If the spirometer includes an indicator to show your best effort, your nurse or respiratory therapist will set it to a desired goal. If possible, sit up straight or lean slightly forward. Try not to slouch. Hold the incentive spirometer in an upright position. INSTRUCTIONS FOR USE  Sit on the edge of your bed if possible, or sit up as far as you can in bed or on a chair. Hold the incentive spirometer in an upright position. Breathe out normally. Place the mouthpiece in your mouth and seal your lips tightly  around it. Breathe in slowly and as deeply as possible, raising the piston or the ball toward the top of the column. Hold your breath for 3-5 seconds or for as long as possible. Allow the piston or ball to fall to the bottom of the column. Remove the mouthpiece from your  mouth and breathe out normally. Rest for a few seconds and repeat Steps 1 through 7 at least 10 times every 1-2 hours when you are awake. Take your time and take a few normal breaths between deep breaths. The spirometer may include an indicator to show your best effort. Use the indicator as a goal to work toward during each repetition. After each set of 10 deep breaths, practice coughing to be sure your lungs are clear. If you have an incision (the cut made at the time of surgery), support your incision when coughing by placing a pillow or rolled up towels firmly against it. Once you are able to get out of bed, walk around indoors and cough well. You may stop using the incentive spirometer when instructed by your caregiver.  RISKS AND COMPLICATIONS Take your time so you do not get dizzy or light-headed. If you are in pain, you may need to take or ask for pain medication before doing incentive spirometry. It is harder to take a deep breath if you are having pain. AFTER USE Rest and breathe slowly and easily. It can be helpful to keep track of a log of your progress. Your caregiver can provide you with a simple table to help with this. If you are using the spirometer at home, follow these instructions: SEEK MEDICAL CARE IF:  You are having difficultly using the spirometer. You have trouble using the spirometer as often as instructed. Your pain medication is not giving enough relief while using the spirometer. You develop fever of 100.5 F (38.1 C) or higher. SEEK IMMEDIATE MEDICAL CARE IF:  You cough up bloody sputum that had not been present before. You develop fever of 102 F (38.9 C) or greater. You develop worsening pain  at or near the incision site. MAKE SURE YOU:  Understand these instructions. Will watch your condition. Will get help right away if you are not doing well or get worse. Document Released: 07/13/2006 Document Revised: 05/25/2011 Document Reviewed: 09/13/2006 [employer] Patient Information 2014 [employer], [place].   ________________________________________________________________________ WHAT IS A BLOOD TRANSFUSION? Blood Transfusion Information  A transfusion is the replacement of blood or some of its parts. Blood is made up of multiple cells which provide different functions. Red blood cells carry oxygen and are used for blood loss replacement. White blood cells fight against infection. Platelets control bleeding. Plasma helps clot blood. Other blood products are available for specialized needs, such as hemophilia or other clotting disorders. BEFORE THE TRANSFUSION  Who gives blood for transfusions?  Healthy volunteers who are fully evaluated to make sure their blood is safe. This is blood bank blood. Transfusion therapy is the safest it has ever been in the practice of medicine. Before blood is taken from a donor, a complete history is taken to make sure that person has no history of diseases nor engages in risky social behavior (examples are intravenous drug use or sexual activity with multiple partners). The donor's travel history is screened to minimize risk of transmitting infections, such as malaria. The donated blood is tested for signs of infectious diseases, such as HIV and hepatitis. The blood is then tested to be sure it is compatible with you in order to minimize the chance of a transfusion reaction. If you or a relative donates blood, this is often done in anticipation of surgery and is not appropriate for emergency situations. It takes many days to process the donated blood. RISKS AND COMPLICATIONS Although transfusion therapy is very safe and saves many  lives, the main dangers of  transfusion include:  Getting an infectious disease. Developing a transfusion reaction. This is an allergic reaction to something in the blood you were given. Every precaution is taken to prevent this. The decision to have a blood transfusion has been considered carefully by your caregiver before blood is given. Blood is not given unless the benefits outweigh the risks. AFTER THE TRANSFUSION Right after receiving a blood transfusion, you will usually feel much better and more energetic. This is especially true if your red blood cells have gotten low (anemic). The transfusion raises the level of the red blood cells which carry oxygen, and this usually causes an energy increase. The nurse administering the transfusion will monitor you carefully for complications. HOME CARE INSTRUCTIONS  No special instructions are needed after a transfusion. You may find your energy is better. Speak with your caregiver about any limitations on activity for underlying diseases you may have. SEEK MEDICAL CARE IF:  Your condition is not improving after your transfusion. You develop redness or irritation at the intravenous (IV) site. SEEK IMMEDIATE MEDICAL CARE IF:  Any of the following symptoms occur over the next 12 hours: Shaking chills. You have a temperature by mouth above 102 F (38.9 C), not controlled by medicine. Chest, back, or muscle pain. People around you feel you are not acting correctly or are confused. Shortness of breath or difficulty breathing. Dizziness and fainting. You get a rash or develop hives. You have a decrease in urine output. Your urine turns a dark color or changes to pink, red, or brown. Any of the following symptoms occur over the next 10 days: You have a temperature by mouth above 102 F (38.9 C), not controlled by medicine. Shortness of breath. Weakness after normal activity. The white part of the eye turns yellow (jaundice). You have a decrease in the amount of urine or are  urinating less often. Your urine turns a dark color or changes to pink, red, or brown. Document Released: 02/28/2000 Document Revised: 05/25/2011 Document Reviewed: 10/17/2007 [facility] Patient Information 2014 [place], [place].  _______________________________________________________________________

## 2022-08-20 NOTE — Progress Notes (Addendum)
COVID Vaccine Completed:  Yes  Date of COVID positive in last 90 days:  No  PCP - Assunta Found, MD (office note requested) Cardiologist - Charlton Haws, MD Pulmonologist - Sandrea Hughs, MD (last OV 2022)  Cardiac clearance in Epic dated 08-05-22 by Robin Searing, NP  Chest x-ray - N/A EKG - 08-24-22 Epic Stress Test - 10-17-13 Epic ECHO - 12-17-20 Epic Cardiac Cath - N/A Pacemaker/ICD device last checked: Spinal Cord Stimulator: N/A  Bowel Prep - Yes.  Patient has instructions and prescriptions  Sleep Study - N/A CPAP -   Fasting Blood Sugar - N/A Checks Blood Sugar _____ times a day  Last dose of GLP1 agonist-  N/A GLP1 instructions:  N/A   Last dose of SGLT-2 inhibitors-  N/A SGLT-2 instructions: N/A   Blood Thinner Instructions:  Time Aspirin Instructions:  ASA 81.  Okay to hold 5-7 days per cardiac clearance.  Patient aware Last Dose:  Activity level:  Can go up a flight of stairs and perform activities of daily living without stopping and without symptoms of chest pain or shortness of breath.  Anesthesia review:  CAD, RBBB, COPD, HTN  BP elevated at PAT 187/102 and on recheck 172/101.  Patient upset after seeing ostomy nurse.  Denies headache, chest pain or shortness of breath.  Will recheck when she gets home and notify PCP if it remains elevated.   Patient denies shortness of breath, fever, cough and chest pain at PAT appointment  Patient verbalized understanding of instructions that were given to them at the PAT appointment. Patient was also instructed that they will need to review over the PAT instructions again at home before surgery.

## 2022-08-24 ENCOUNTER — Other Ambulatory Visit: Payer: Self-pay

## 2022-08-24 ENCOUNTER — Encounter (HOSPITAL_COMMUNITY)
Admission: RE | Admit: 2022-08-24 | Discharge: 2022-08-24 | Disposition: A | Discharge status: Home / Self Care | Payer: Medicare PPO | Source: Ambulatory Visit | Attending: Surgery | Admitting: Surgery

## 2022-08-24 ENCOUNTER — Encounter (HOSPITAL_COMMUNITY): Payer: Self-pay

## 2022-08-24 VITALS — BP 172/101 | HR 71 | Temp 97.9°F | Resp 16 | Ht 62.0 in | Wt 140.0 lb

## 2022-08-24 DIAGNOSIS — M069 Rheumatoid arthritis, unspecified: Secondary | ICD-10-CM | POA: Insufficient documentation

## 2022-08-24 DIAGNOSIS — Z87891 Personal history of nicotine dependence: Secondary | ICD-10-CM | POA: Diagnosis not present

## 2022-08-24 DIAGNOSIS — J4489 Other specified chronic obstructive pulmonary disease: Secondary | ICD-10-CM | POA: Diagnosis not present

## 2022-08-24 DIAGNOSIS — I4510 Unspecified right bundle-branch block: Secondary | ICD-10-CM | POA: Diagnosis not present

## 2022-08-24 DIAGNOSIS — K759 Inflammatory liver disease, unspecified: Secondary | ICD-10-CM | POA: Diagnosis not present

## 2022-08-24 DIAGNOSIS — I10 Essential (primary) hypertension: Secondary | ICD-10-CM | POA: Insufficient documentation

## 2022-08-24 DIAGNOSIS — R739 Hyperglycemia, unspecified: Secondary | ICD-10-CM | POA: Insufficient documentation

## 2022-08-24 DIAGNOSIS — K56699 Other intestinal obstruction unspecified as to partial versus complete obstruction: Secondary | ICD-10-CM | POA: Insufficient documentation

## 2022-08-24 DIAGNOSIS — Z01818 Encounter for other preprocedural examination: Secondary | ICD-10-CM | POA: Insufficient documentation

## 2022-08-24 HISTORY — DX: Inflammatory liver disease, unspecified: K75.9

## 2022-08-24 HISTORY — DX: Gastro-esophageal reflux disease without esophagitis: K21.9

## 2022-08-24 HISTORY — DX: Unspecified asthma, uncomplicated: J45.909

## 2022-08-24 HISTORY — DX: Pneumonia, unspecified organism: J18.9

## 2022-08-24 LAB — CBC
HCT: 38.2 % (ref 36.0–46.0)
Hemoglobin: 13.1 g/dL (ref 12.0–15.0)
MCH: 32.8 pg (ref 26.0–34.0)
MCHC: 34.3 g/dL (ref 30.0–36.0)
MCV: 95.5 fL (ref 80.0–100.0)
Platelets: 262 10*3/uL (ref 150–400)
RBC: 4 MIL/uL (ref 3.87–5.11)
RDW: 14.6 % (ref 11.5–15.5)
WBC: 8 10*3/uL (ref 4.0–10.5)
nRBC: 0 % (ref 0.0–0.2)

## 2022-08-24 LAB — COMPREHENSIVE METABOLIC PANEL
ALT: 23 U/L (ref 0–44)
AST: 28 U/L (ref 15–41)
Albumin: 3.8 g/dL (ref 3.5–5.0)
Alkaline Phosphatase: 76 U/L (ref 38–126)
Anion gap: 11 (ref 5–15)
BUN: 25 mg/dL — ABNORMAL HIGH (ref 8–23)
CO2: 27 mmol/L (ref 22–32)
Calcium: 9.9 mg/dL (ref 8.9–10.3)
Chloride: 103 mmol/L (ref 98–111)
Creatinine, Ser: 1.31 mg/dL — ABNORMAL HIGH (ref 0.44–1.00)
GFR, Estimated: 43 mL/min — ABNORMAL LOW (ref >60)
Glucose, Bld: 98 mg/dL (ref 70–99)
Potassium: 4 mmol/L (ref 3.5–5.1)
Sodium: 141 mmol/L (ref 135–145)
Total Bilirubin: 1.6 mg/dL — ABNORMAL HIGH (ref 0.3–1.2)
Total Protein: 7.8 g/dL (ref 6.5–8.1)

## 2022-08-24 LAB — HEMOGLOBIN A1C
Hgb A1c MFr Bld: 4.9 % (ref 4.8–5.6)
Mean Plasma Glucose: 93.93 mg/dL

## 2022-08-24 NOTE — Consult Note (Addendum)
WOC Nurse requested for preoperative stoma site marking  Discussed surgical procedure and stoma creation with patient and family.  Explained role of the WOC nurse team.  Provided the patient with educational booklet and provided samples of pouching options.  Answered patient and family questions. Explained that this is "just in case" an ostomy needs to be performed.   Examined patient sitting, and standing in order to place the marking in the patient's visual field, away from any creases or abdominal contour issues and within the rectus muscle.  Attempted to mark below the patient's belt line.   Marked for colostomy in the LLQ  ___5_ cm to the left of the umbilicus and __8__cm below the umbilicus.  Marked for ileostomy in the RLQ  __5__cm to the right of the umbilicus and  _8___ cm below the umbilicus.  There are significant folds located above and below these sites which should be avoided if possible.   Patient's abdomen cleansed with CHG wipes at site markings, allowed to air dry prior to marking.Covered mark with thin film transparent dressing to preserve mark until date of surgery. Provided with marking pen and explained that they should re-color in the mark prior to surgery if it begins to fade.   WOC Nurse team will follow up with patient after surgery for continued ostomy care and teaching if patient receives an ostomy. Please re-consult if further assistance is needed.  Mardee Postin MSN, RN, CWOCN, [place], CNS [phone] .

## 2022-08-25 LAB — BPAM RBC: Unit Type and Rh: 5100

## 2022-08-25 LAB — TYPE AND SCREEN
ABO/RH(D): O POS
Unit division: 0

## 2022-08-26 NOTE — Progress Notes (Signed)
Anesthesia Chart Review   Case: [id number] Date/Time: 09/03/22 1245   Procedures:      ROBOTIC RESECTION OF COLON: SIGMOID - GEN w/ERAS PATHWAY     RIGID PROCTOSCOPY   Anesthesia type: General   Pre-op diagnosis: STRICTURE OF COLON   Location: [facility] 02 / [facility]   Surgeons: Karie Soda, MD       DISCUSSION:72 y.o. former smoker with h/o HTN, PVCs, asthma, COPD, RBBB, RA, stricture of colon scheduled for above procedure 09/03/2022 with Dr. Karie Soda.   Per cardiology preoperative evaluation 08/06/2022, "-Patient's RCRI score is 0.9% The patient affirms she has been doing well without any new cardiac symptoms. They are able to achieve 4 METS without cardiac limitations. Therefore, based on ACC/AHA guidelines, the patient would be at acceptable risk for the planned procedure without further cardiovascular testing. The patient was advised that if she develops new symptoms prior to surgery to contact our office to arrange for a follow-up visit, and she verbalized understanding.  The patient was advised that if she develops new symptoms prior to surgery to contact our office to arrange for a follow-up visit, and she verbalized understanding.   Regarding ASA therapy, we recommend it may be stopped 5-7 days prior to surgery with a plan to resume it as soon as felt to be feasible from a surgical standpoint in the post-operative period. "  BP Readings from Last 3 Encounters:  08/24/22 (!) 172/101  06/09/22 131/88  12/08/21 133/80   Elevated BP at PAT visit, pt advised to continue monitoring at home and reach out to PCP.   VS: BP (!) 172/101   Pulse 71   Temp 36.6 C (Oral)   Resp 16   Ht 5\' 2"  (1.575 m)   Wt 63.5 kg   SpO2 100%   BMI 25.61 kg/m   PROVIDERS: Assunta Found, MD is PCP   Cardiologist - Charlton Haws, MD   Pulmonologist - Sandrea Hughs, MD  LABS: Labs reviewed: Acceptable for surgery. (all labs ordered are listed, but only abnormal results are displayed)  Labs  Reviewed  COMPREHENSIVE METABOLIC PANEL - Abnormal; Notable for the following components:      Result Value   BUN 25 (*)    Creatinine, Ser 1.31 (*)    Total Bilirubin 1.6 (*)    GFR, Estimated 43 (*)    All other components within normal limits  CBC  HEMOGLOBIN A1C  TYPE AND SCREEN     IMAGES:   EKG:   CV: Echo 12/17/2020 1. Left ventricular ejection fraction, by estimation, is 70 to 75%. The  left ventricle has hyperdynamic function. The left ventricle has no  regional wall motion abnormalities. There is moderate left ventricular  hypertrophy. Left ventricular diastolic  parameters were normal.   2. Right ventricular systolic function is mildly reduced. The right  ventricular size is moderately enlarged. There is normal pulmonary artery  systolic pressure. The estimated right ventricular systolic pressure is  30.9 mmHg.   3. Right atrial size was mild to moderately dilated.   4. The mitral valve is grossly normal. Trivial mitral valve  regurgitation.   5. Tricuspid valve regurgitation is mild to moderate.   6. The aortic valve is tricuspid. Aortic valve regurgitation is not  visualized. No aortic stenosis is present. Aortic valve mean gradient  measures 4.0 mmHg.   7. The inferior vena cava is normal in size with greater than 50%  respiratory variability, suggesting right atrial pressure of 3 mmHg.  Comparison(s): Prior images unable to be directly viewed.  Past Medical History:  Diagnosis Date   Asthma    Chronic constipation    COVID-19 11/2020   Diverticulitis 08/2016   GERD (gastroesophageal reflux disease)    Hepatitis    Teens/20s   History of echocardiogram    Echo (8/15): Vigorous LV function, EF 65-70%, normal wall motion, grade 1 diastolic dysfunction, mild to moderate TR, PASP 31 mm Hg   Hx of cardiovascular stress test    ETT-Myoview (8/15): no ischemia, EF 56%, normal study   Hypertension    Hypothyroid    Pernicious anemia    Pneumonia     2022   PVC's (premature ventricular contractions)    Rheumatoid arthritis (HCC)    Right bundle branch block    S/P colonoscopy 2001, 2004   2001: hyperplastic polyp, 2004: normal, inflammatory polyp    Past Surgical History:  Procedure Laterality Date   ABDOMINAL HYSTERECTOMY     BACK SURGERY     BIOPSY  12/08/2021   Procedure: BIOPSY;  Surgeon: Corbin Ade, MD;  [facility];  Service: Endoscopy;;   BREAST BIOPSY Right 10/29/2010   BREAST BIOPSY Right 11/27/2014   BREAST EXCISIONAL BIOPSY Right 02/21/2015   BREAST LUMPECTOMY WITH RADIOACTIVE SEED LOCALIZATION Right 02/21/2015   Procedure: RIGHT BREAST LUMPECTOMY WITH RADIOACTIVE SEED LOCALIZATION;  Surgeon: Emelia Loron, MD;  Location: [facility];  Service: General;  Laterality: Right;   COLONOSCOPY  11/10/2010   Procedure: COLONOSCOPY;  Surgeon: Corbin Ade, MD;  [facility];  Service: Endoscopy;  Laterality: N/A;   COLONOSCOPY N/A 09/28/2016   Dr. Jena Gauss; Diverticulosis with evidence of recent diverticulitis.  Next colonoscopy in 2023 given history of tubular adenomas in the past.   COLONOSCOPY WITH PROPOFOL N/A 12/08/2021   Procedure: COLONOSCOPY WITH PROPOFOL;  Surgeon: Corbin Ade, MD;  [facility];  Service: Endoscopy;  Laterality: N/A;  11:00am   POLYPECTOMY  12/08/2021   Procedure: POLYPECTOMY;  Surgeon: Corbin Ade, MD;  [facility];  Service: Endoscopy;;   SUBMUCOSAL TATTOO INJECTION  12/08/2021   Procedure: SUBMUCOSAL TATTOO INJECTION;  Surgeon: Corbin Ade, MD;  [facility];  Service: Endoscopy;;   THYROIDECTOMY      MEDICATIONS:  acetaminophen (TYLENOL) 500 MG tablet   albuterol (VENTOLIN HFA) 108 (90 Base) MCG/ACT inhaler   aspirin EC 81 MG tablet   Budeson-Glycopyrrol-Formoterol (BREZTRI AEROSPHERE) 160-9-4.8 MCG/ACT AERO   calcium carbonate (TUMS - DOSED IN MG ELEMENTAL CALCIUM) 500 MG chewable tablet   cyanocobalamin  (,VITAMIN B-12,) 1000 MCG/ML injection   diphenhydramine-acetaminophen (TYLENOL PM) 25-500 MG TABS tablet   folic acid (FOLVITE) 1 MG tablet   hydrochlorothiazide (HYDRODIURIL) 25 MG tablet   levothyroxine (SYNTHROID, LEVOTHROID) 88 MCG tablet   losartan (COZAAR) 100 MG tablet   methotrexate (RHEUMATREX) 2.5 MG tablet   metoprolol succinate (TOPROL-XL) 25 MG 24 hr tablet   Multiple Vitamin (MULTIVITAMIN WITH MINERALS) TABS tablet   pantoprazole (PROTONIX) 40 MG tablet   TRULANCE 3 MG TABS   Wheat Dextrin (BENEFIBER) POWD   No current facility-administered medications for this encounter.    Jodell Cipro Ward, PA-C [facility]Surgical Testing [phone]

## 2022-09-01 LAB — TYPE AND SCREEN: Unit division: 0

## 2022-09-01 LAB — BPAM RBC

## 2022-09-03 ENCOUNTER — Inpatient Hospital Stay (HOSPITAL_COMMUNITY)
Admission: RE | Admit: 2022-09-03 | Discharge: 2022-09-07 | DRG: 331 | Disposition: A | Discharge status: Home / Self Care | Payer: Medicare PPO | Attending: Surgery | Admitting: Surgery

## 2022-09-03 ENCOUNTER — Other Ambulatory Visit: Payer: Self-pay

## 2022-09-03 ENCOUNTER — Inpatient Hospital Stay (HOSPITAL_COMMUNITY): Payer: Medicare PPO | Admitting: Anesthesiology

## 2022-09-03 ENCOUNTER — Encounter (HOSPITAL_COMMUNITY)
Admission: RE | Disposition: A | Discharge status: Home / Self Care | Payer: Self-pay | Source: Home / Self Care | Attending: Surgery

## 2022-09-03 ENCOUNTER — Inpatient Hospital Stay (HOSPITAL_COMMUNITY): Payer: Medicare PPO | Admitting: Physician Assistant

## 2022-09-03 ENCOUNTER — Encounter (HOSPITAL_COMMUNITY): Payer: Self-pay | Admitting: Surgery

## 2022-09-03 DIAGNOSIS — K56609 Unspecified intestinal obstruction, unspecified as to partial versus complete obstruction: Secondary | ICD-10-CM

## 2022-09-03 DIAGNOSIS — E039 Hypothyroidism, unspecified: Secondary | ICD-10-CM | POA: Diagnosis not present

## 2022-09-03 DIAGNOSIS — Z8249 Family history of ischemic heart disease and other diseases of the circulatory system: Secondary | ICD-10-CM

## 2022-09-03 DIAGNOSIS — Z87891 Personal history of nicotine dependence: Secondary | ICD-10-CM

## 2022-09-03 DIAGNOSIS — I10 Essential (primary) hypertension: Secondary | ICD-10-CM | POA: Diagnosis not present

## 2022-09-03 DIAGNOSIS — Z885 Allergy status to narcotic agent status: Secondary | ICD-10-CM | POA: Diagnosis not present

## 2022-09-03 DIAGNOSIS — D510 Vitamin B12 deficiency anemia due to intrinsic factor deficiency: Secondary | ICD-10-CM | POA: Diagnosis present

## 2022-09-03 DIAGNOSIS — N183 Chronic kidney disease, stage 3 unspecified: Secondary | ICD-10-CM | POA: Diagnosis not present

## 2022-09-03 DIAGNOSIS — N80562 Deep endometriosis of the small intestine: Secondary | ICD-10-CM | POA: Diagnosis present

## 2022-09-03 DIAGNOSIS — Z8616 Personal history of COVID-19: Secondary | ICD-10-CM | POA: Diagnosis not present

## 2022-09-03 DIAGNOSIS — K5669 Other partial intestinal obstruction: Principal | ICD-10-CM | POA: Diagnosis present

## 2022-09-03 DIAGNOSIS — Z833 Family history of diabetes mellitus: Secondary | ICD-10-CM

## 2022-09-03 DIAGNOSIS — J45909 Unspecified asthma, uncomplicated: Secondary | ICD-10-CM | POA: Diagnosis present

## 2022-09-03 DIAGNOSIS — I129 Hypertensive chronic kidney disease with stage 1 through stage 4 chronic kidney disease, or unspecified chronic kidney disease: Secondary | ICD-10-CM | POA: Diagnosis present

## 2022-09-03 DIAGNOSIS — K219 Gastro-esophageal reflux disease without esophagitis: Secondary | ICD-10-CM | POA: Diagnosis present

## 2022-09-03 DIAGNOSIS — Z8349 Family history of other endocrine, nutritional and metabolic diseases: Secondary | ICD-10-CM

## 2022-09-03 DIAGNOSIS — N80569 Endometriosis of the small intestine, unspecified depth: Secondary | ICD-10-CM

## 2022-09-03 DIAGNOSIS — Z79899 Other long term (current) drug therapy: Secondary | ICD-10-CM

## 2022-09-03 DIAGNOSIS — N6019 Diffuse cystic mastopathy of unspecified breast: Secondary | ICD-10-CM

## 2022-09-03 DIAGNOSIS — K660 Peritoneal adhesions (postprocedural) (postinfection): Secondary | ICD-10-CM | POA: Diagnosis present

## 2022-09-03 DIAGNOSIS — I493 Ventricular premature depolarization: Secondary | ICD-10-CM | POA: Diagnosis present

## 2022-09-03 DIAGNOSIS — J449 Chronic obstructive pulmonary disease, unspecified: Secondary | ICD-10-CM | POA: Diagnosis not present

## 2022-09-03 DIAGNOSIS — Z79890 Hormone replacement therapy (postmenopausal): Secondary | ICD-10-CM

## 2022-09-03 DIAGNOSIS — Z8719 Personal history of other diseases of the digestive system: Secondary | ICD-10-CM

## 2022-09-03 DIAGNOSIS — M069 Rheumatoid arthritis, unspecified: Secondary | ICD-10-CM | POA: Diagnosis present

## 2022-09-03 DIAGNOSIS — I4510 Unspecified right bundle-branch block: Secondary | ICD-10-CM | POA: Diagnosis present

## 2022-09-03 DIAGNOSIS — K56699 Other intestinal obstruction unspecified as to partial versus complete obstruction: Secondary | ICD-10-CM | POA: Diagnosis present

## 2022-09-03 DIAGNOSIS — K5790 Diverticulosis of intestine, part unspecified, without perforation or abscess without bleeding: Secondary | ICD-10-CM | POA: Diagnosis not present

## 2022-09-03 DIAGNOSIS — Z01818 Encounter for other preprocedural examination: Secondary | ICD-10-CM

## 2022-09-03 DIAGNOSIS — K5730 Diverticulosis of large intestine without perforation or abscess without bleeding: Secondary | ICD-10-CM | POA: Diagnosis not present

## 2022-09-03 HISTORY — DX: Deep endometriosis of the small intestine: N80.562

## 2022-09-03 HISTORY — PX: PROCTOSCOPY: SHX2266

## 2022-09-03 LAB — BPAM RBC
Blood Product Expiration Date: 202407202359
Blood Product Expiration Date: 202407202359
Blood Product Expiration Date: 202407202359
Unit Type and Rh: 5100
Unit Type and Rh: 5100
Unit Type and Rh: 5100

## 2022-09-03 LAB — TYPE AND SCREEN
ABO/RH(D): O POS
Antibody Screen: POSITIVE
Antibody Screen: POSITIVE
PT AG Type: NEGATIVE
Unit division: 0

## 2022-09-03 SURGERY — COLECTOMY, SIGMOID, ROBOT-ASSISTED
Anesthesia: General

## 2022-09-03 MED ORDER — METHOCARBAMOL 500 MG PO TABS: 1000.0000 mg | ORAL_TABLET | Freq: Four times a day (QID) | ORAL | Status: DC | PRN

## 2022-09-03 MED ORDER — PROCHLORPERAZINE MALEATE 10 MG PO TABS: 10.0000 mg | ORAL_TABLET | Freq: Four times a day (QID) | ORAL | Status: DC | PRN

## 2022-09-03 MED ORDER — SIMETHICONE 80 MG PO CHEW: 40.0000 mg | CHEWABLE_TABLET | Freq: Four times a day (QID) | ORAL | Status: DC | PRN

## 2022-09-03 MED ORDER — FENTANYL CITRATE (PF) 100 MCG/2ML IJ SOLN
INTRAMUSCULAR | Status: DC | PRN
  Administered 2022-09-03: 100 ug via INTRAVENOUS
  Administered 2022-09-03 (×2): 50 ug via INTRAVENOUS

## 2022-09-03 MED ORDER — LACTATED RINGERS IR SOLN
Status: DC | PRN
  Administered 2022-09-03: 1000 mL

## 2022-09-03 MED ORDER — NEOMYCIN SULFATE 500 MG PO TABS: 1000.0000 mg | ORAL_TABLET | ORAL | Status: DC

## 2022-09-03 MED ORDER — PHENYLEPHRINE HCL (PRESSORS) 10 MG/ML IV SOLN
INTRAVENOUS | Status: AC
  Filled 2022-09-03: qty 1

## 2022-09-03 MED ORDER — ENSURE PRE-SURGERY PO LIQD
592.0000 mL | Freq: Once | ORAL | Status: DC
  Filled 2022-09-03: qty 592

## 2022-09-03 MED ORDER — DIPHENHYDRAMINE HCL 50 MG/ML IJ SOLN: 12.5000 mg | Freq: Four times a day (QID) | INTRAMUSCULAR | Status: DC | PRN

## 2022-09-03 MED ORDER — PHENYLEPHRINE HCL-NACL 20-0.9 MG/250ML-% IV SOLN
INTRAVENOUS | Status: DC | PRN
  Administered 2022-09-03: 25 ug/min via INTRAVENOUS

## 2022-09-03 MED ORDER — BUDESON-GLYCOPYRROL-FORMOTEROL 160-9-4.8 MCG/ACT IN AERO: 2.0000 | INHALATION_SPRAY | Freq: Two times a day (BID) | RESPIRATORY_TRACT | Status: DC

## 2022-09-03 MED ORDER — LACTATED RINGERS IV SOLN: INTRAVENOUS | Status: DC

## 2022-09-03 MED ORDER — BUPIVACAINE LIPOSOME 1.3 % IJ SUSP
INTRAMUSCULAR | Status: DC | PRN
  Administered 2022-09-03: 20 mL

## 2022-09-03 MED ORDER — FOLIC ACID 1 MG PO TABS
1.0000 mg | ORAL_TABLET | Freq: Every day | ORAL | Status: DC
  Administered 2022-09-04 – 2022-09-06 (×3): 1 mg via ORAL
  Filled 2022-09-03 (×3): qty 1

## 2022-09-03 MED ORDER — ENSURE SURGERY PO LIQD
237.0000 mL | Freq: Two times a day (BID) | ORAL | Status: DC
  Administered 2022-09-04 – 2022-09-05 (×3): 237 mL via ORAL

## 2022-09-03 MED ORDER — HYDRALAZINE HCL 20 MG/ML IJ SOLN
INTRAMUSCULAR | Status: DC | PRN
  Administered 2022-09-03: 5 mg via INTRAVENOUS

## 2022-09-03 MED ORDER — MELATONIN 3 MG PO TABS: 3.0000 mg | ORAL_TABLET | Freq: Every evening | ORAL | Status: DC | PRN

## 2022-09-03 MED ORDER — FENTANYL CITRATE (PF) 250 MCG/5ML IJ SOLN
INTRAMUSCULAR | Status: AC
  Filled 2022-09-03: qty 5

## 2022-09-03 MED ORDER — HYDRALAZINE HCL 20 MG/ML IJ SOLN
INTRAMUSCULAR | Status: AC
  Filled 2022-09-03: qty 1

## 2022-09-03 MED ORDER — UMECLIDINIUM BROMIDE 62.5 MCG/ACT IN AEPB
1.0000 | INHALATION_SPRAY | Freq: Every day | RESPIRATORY_TRACT | Status: DC
  Filled 2022-09-03: qty 7

## 2022-09-03 MED ORDER — 0.9 % SODIUM CHLORIDE (POUR BTL) OPTIME
TOPICAL | Status: DC | PRN
  Administered 2022-09-03: 2000 mL

## 2022-09-03 MED ORDER — PROPOFOL 10 MG/ML IV BOLUS
INTRAVENOUS | Status: DC | PRN
  Administered 2022-09-03: 100 mg via INTRAVENOUS

## 2022-09-03 MED ORDER — ENOXAPARIN SODIUM 40 MG/0.4ML IJ SOSY
40.0000 mg | PREFILLED_SYRINGE | Freq: Once | INTRAMUSCULAR | Status: AC
  Administered 2022-09-03: 40 mg via SUBCUTANEOUS
  Filled 2022-09-03: qty 0.4

## 2022-09-03 MED ORDER — LIDOCAINE HCL (CARDIAC) PF 100 MG/5ML IV SOSY
PREFILLED_SYRINGE | INTRAVENOUS | Status: DC | PRN
  Administered 2022-09-03: 60 mg via INTRAVENOUS

## 2022-09-03 MED ORDER — HYDROMORPHONE HCL 1 MG/ML IJ SOLN
INTRAMUSCULAR | Status: AC
  Filled 2022-09-03: qty 1

## 2022-09-03 MED ORDER — CHLORHEXIDINE GLUCONATE 0.12 % MT SOLN
15.0000 mL | Freq: Once | OROMUCOSAL | Status: AC
  Administered 2022-09-03: 15 mL via OROMUCOSAL

## 2022-09-03 MED ORDER — DEXAMETHASONE SODIUM PHOSPHATE 4 MG/ML IJ SOLN
INTRAMUSCULAR | Status: DC | PRN
  Administered 2022-09-03: 8 mg via INTRAVENOUS

## 2022-09-03 MED ORDER — ACETAMINOPHEN 500 MG PO TABS
1000.0000 mg | ORAL_TABLET | Freq: Four times a day (QID) | ORAL | Status: DC
  Administered 2022-09-04 – 2022-09-07 (×12): 1000 mg via ORAL
  Filled 2022-09-03 (×12): qty 2

## 2022-09-03 MED ORDER — ENOXAPARIN SODIUM 40 MG/0.4ML IJ SOSY
40.0000 mg | PREFILLED_SYRINGE | INTRAMUSCULAR | Status: DC
  Administered 2022-09-04 – 2022-09-07 (×4): 40 mg via SUBCUTANEOUS
  Filled 2022-09-03 (×4): qty 0.4

## 2022-09-03 MED ORDER — PHENYLEPHRINE 80 MCG/ML (10ML) SYRINGE FOR IV PUSH (FOR BLOOD PRESSURE SUPPORT)
PREFILLED_SYRINGE | INTRAVENOUS | Status: AC
  Filled 2022-09-03: qty 10

## 2022-09-03 MED ORDER — ROCURONIUM BROMIDE 100 MG/10ML IV SOLN
INTRAVENOUS | Status: DC | PRN
  Administered 2022-09-03: 60 mg via INTRAVENOUS
  Administered 2022-09-03: 20 mg via INTRAVENOUS

## 2022-09-03 MED ORDER — PHENYLEPHRINE HCL (PRESSORS) 10 MG/ML IV SOLN
INTRAVENOUS | Status: DC | PRN
  Administered 2022-09-03: 160 ug via INTRAVENOUS
  Administered 2022-09-03: 80 ug via INTRAVENOUS

## 2022-09-03 MED ORDER — HYDROMORPHONE HCL 1 MG/ML IJ SOLN
0.2500 mg | INTRAMUSCULAR | Status: DC | PRN
  Administered 2022-09-03 (×2): 0.5 mg via INTRAVENOUS

## 2022-09-03 MED ORDER — PHENYLEPHRINE HCL (PRESSORS) 10 MG/ML IV SOLN
INTRAVENOUS | Status: DC | PRN
  Administered 2022-09-03 (×2): 80 ug via SUBCUTANEOUS

## 2022-09-03 MED ORDER — GABAPENTIN 300 MG PO CAPS
300.0000 mg | ORAL_CAPSULE | ORAL | Status: AC
  Administered 2022-09-03: 300 mg via ORAL
  Filled 2022-09-03: qty 1

## 2022-09-03 MED ORDER — ONDANSETRON HCL 4 MG/2ML IJ SOLN
INTRAMUSCULAR | Status: DC | PRN
  Administered 2022-09-03: 4 mg via INTRAVENOUS

## 2022-09-03 MED ORDER — BUPIVACAINE LIPOSOME 1.3 % IJ SUSP: 20.0000 mL | Freq: Once | INTRAMUSCULAR | Status: DC

## 2022-09-03 MED ORDER — TRAMADOL HCL 50 MG PO TABS
50.0000 mg | ORAL_TABLET | Freq: Four times a day (QID) | ORAL | Status: DC | PRN
  Administered 2022-09-04 – 2022-09-06 (×3): 100 mg via ORAL
  Filled 2022-09-03 (×3): qty 2

## 2022-09-03 MED ORDER — SODIUM CHLORIDE 0.9 % IV SOLN: 250.0000 mL | INTRAVENOUS | Status: DC | PRN

## 2022-09-03 MED ORDER — POLYETHYLENE GLYCOL 3350 17 GM/SCOOP PO POWD: 1.0000 | Freq: Once | ORAL | Status: DC

## 2022-09-03 MED ORDER — ALUM & MAG HYDROXIDE-SIMETH 200-200-20 MG/5ML PO SUSP: 30.0000 mL | Freq: Four times a day (QID) | ORAL | Status: DC | PRN

## 2022-09-03 MED ORDER — METRONIDAZOLE 500 MG PO TABS: 1000.0000 mg | ORAL_TABLET | ORAL | Status: DC

## 2022-09-03 MED ORDER — SODIUM CHLORIDE 0.9 % IV SOLN
2.0000 g | Freq: Two times a day (BID) | INTRAVENOUS | Status: AC
  Administered 2022-09-03: 2 g via INTRAVENOUS
  Filled 2022-09-03: qty 2

## 2022-09-03 MED ORDER — CALCIUM POLYCARBOPHIL 625 MG PO TABS
625.0000 mg | ORAL_TABLET | Freq: Two times a day (BID) | ORAL | Status: DC
  Administered 2022-09-03 – 2022-09-06 (×7): 625 mg via ORAL
  Filled 2022-09-03 (×7): qty 1

## 2022-09-03 MED ORDER — BUPIVACAINE-EPINEPHRINE 0.25% -1:200000 IJ SOLN
INTRAMUSCULAR | Status: AC
  Filled 2022-09-03: qty 1

## 2022-09-03 MED ORDER — LIDOCAINE 2% (20 MG/ML) 5 ML SYRINGE
INTRAMUSCULAR | Status: DC | PRN
  Administered 2022-09-03: 1.5 mg/kg/h via INTRAVENOUS

## 2022-09-03 MED ORDER — MAGIC MOUTHWASH: 15.0000 mL | Freq: Four times a day (QID) | ORAL | Status: DC | PRN

## 2022-09-03 MED ORDER — SUGAMMADEX SODIUM 200 MG/2ML IV SOLN
INTRAVENOUS | Status: DC | PRN
  Administered 2022-09-03: 240 mg via INTRAVENOUS

## 2022-09-03 MED ORDER — KETAMINE HCL 10 MG/ML IJ SOLN
INTRAMUSCULAR | Status: DC | PRN
  Administered 2022-09-03 (×2): 20 mg via INTRAVENOUS

## 2022-09-03 MED ORDER — AMISULPRIDE (ANTIEMETIC) 5 MG/2ML IV SOLN
10.0000 mg | Freq: Once | INTRAVENOUS | Status: AC | PRN
  Administered 2022-09-03: 10 mg via INTRAVENOUS

## 2022-09-03 MED ORDER — MIDAZOLAM HCL 2 MG/2ML IJ SOLN
INTRAMUSCULAR | Status: AC
  Filled 2022-09-03: qty 2

## 2022-09-03 MED ORDER — ALBUTEROL SULFATE (2.5 MG/3ML) 0.083% IN NEBU
3.0000 mL | INHALATION_SOLUTION | Freq: Four times a day (QID) | RESPIRATORY_TRACT | Status: DC | PRN
  Administered 2022-09-04: 3 mL via RESPIRATORY_TRACT
  Filled 2022-09-03: qty 3

## 2022-09-03 MED ORDER — PHENOL 1.4 % MT LIQD: 2.0000 | OROMUCOSAL | Status: DC | PRN

## 2022-09-03 MED ORDER — METOPROLOL TARTRATE 5 MG/5ML IV SOLN: 5.0000 mg | Freq: Four times a day (QID) | INTRAVENOUS | Status: DC | PRN

## 2022-09-03 MED ORDER — SODIUM CHLORIDE 0.9% FLUSH
3.0000 mL | Freq: Two times a day (BID) | INTRAVENOUS | Status: DC
  Administered 2022-09-03 – 2022-09-05 (×4): 3 mL via INTRAVENOUS

## 2022-09-03 MED ORDER — SODIUM CHLORIDE 0.9% FLUSH: 3.0000 mL | INTRAVENOUS | Status: DC | PRN

## 2022-09-03 MED ORDER — KETAMINE HCL 50 MG/5ML IJ SOSY
PREFILLED_SYRINGE | INTRAMUSCULAR | Status: AC
  Filled 2022-09-03: qty 5

## 2022-09-03 MED ORDER — LACTATED RINGERS IV SOLN: INTRAVENOUS | Status: DC | PRN

## 2022-09-03 MED ORDER — LEVOTHYROXINE SODIUM 88 MCG PO TABS
88.0000 ug | ORAL_TABLET | Freq: Every day | ORAL | Status: DC
  Administered 2022-09-04 – 2022-09-07 (×4): 88 ug via ORAL
  Filled 2022-09-03 (×4): qty 1

## 2022-09-03 MED ORDER — BUPIVACAINE-EPINEPHRINE (PF) 0.25% -1:200000 IJ SOLN
INTRAMUSCULAR | Status: DC | PRN
  Administered 2022-09-03: 50 mL

## 2022-09-03 MED ORDER — MIDAZOLAM HCL 5 MG/5ML IJ SOLN
INTRAMUSCULAR | Status: DC | PRN
  Administered 2022-09-03: 2 mg via INTRAVENOUS

## 2022-09-03 MED ORDER — ASPIRIN 81 MG PO TBEC
81.0000 mg | DELAYED_RELEASE_TABLET | Freq: Every day | ORAL | Status: DC
  Administered 2022-09-04 – 2022-09-06 (×3): 81 mg via ORAL
  Filled 2022-09-03 (×3): qty 1

## 2022-09-03 MED ORDER — ALVIMOPAN 12 MG PO CAPS
12.0000 mg | ORAL_CAPSULE | Freq: Two times a day (BID) | ORAL | Status: DC
  Administered 2022-09-04 – 2022-09-05 (×4): 12 mg via ORAL
  Filled 2022-09-03 (×4): qty 1

## 2022-09-03 MED ORDER — EPHEDRINE SULFATE (PRESSORS) 50 MG/ML IJ SOLN
INTRAMUSCULAR | Status: DC | PRN
  Administered 2022-09-03 (×2): 10 mg via INTRAVENOUS

## 2022-09-03 MED ORDER — ACETAMINOPHEN 500 MG PO TABS
1000.0000 mg | ORAL_TABLET | ORAL | Status: AC
  Administered 2022-09-03: 1000 mg via ORAL
  Filled 2022-09-03: qty 2

## 2022-09-03 MED ORDER — METOPROLOL TARTRATE 12.5 MG HALF TABLET
12.5000 mg | ORAL_TABLET | Freq: Two times a day (BID) | ORAL | Status: DC
  Administered 2022-09-03 – 2022-09-06 (×7): 12.5 mg via ORAL
  Filled 2022-09-03 (×7): qty 1

## 2022-09-03 MED ORDER — HYDROMORPHONE HCL 1 MG/ML IJ SOLN: 0.5000 mg | INTRAMUSCULAR | Status: DC | PRN

## 2022-09-03 MED ORDER — SODIUM CHLORIDE 0.9 % IV SOLN
2.0000 g | INTRAVENOUS | Status: AC
  Administered 2022-09-03: 2 g via INTRAVENOUS
  Filled 2022-09-03: qty 2

## 2022-09-03 MED ORDER — BUPIVACAINE LIPOSOME 1.3 % IJ SUSP
INTRAMUSCULAR | Status: AC
  Filled 2022-09-03: qty 20

## 2022-09-03 MED ORDER — ALVIMOPAN 12 MG PO CAPS
12.0000 mg | ORAL_CAPSULE | ORAL | Status: AC
  Administered 2022-09-03: 12 mg via ORAL
  Filled 2022-09-03: qty 1

## 2022-09-03 MED ORDER — PROCHLORPERAZINE EDISYLATE 10 MG/2ML IJ SOLN
5.0000 mg | Freq: Four times a day (QID) | INTRAMUSCULAR | Status: DC | PRN
  Administered 2022-09-03: 5 mg via INTRAVENOUS
  Filled 2022-09-03: qty 2

## 2022-09-03 MED ORDER — HYDRALAZINE HCL 20 MG/ML IJ SOLN: 10.0000 mg | INTRAMUSCULAR | Status: DC | PRN

## 2022-09-03 MED ORDER — BISACODYL 5 MG PO TBEC: 20.0000 mg | DELAYED_RELEASE_TABLET | Freq: Once | ORAL | Status: DC

## 2022-09-03 MED ORDER — FLUTICASONE FUROATE-VILANTEROL 200-25 MCG/ACT IN AEPB
1.0000 | INHALATION_SPRAY | Freq: Every day | RESPIRATORY_TRACT | Status: DC
  Filled 2022-09-03: qty 28

## 2022-09-03 MED ORDER — LACTATED RINGERS IV BOLUS: 1000.0000 mL | Freq: Three times a day (TID) | INTRAVENOUS | Status: AC | PRN

## 2022-09-03 MED ORDER — ENSURE PRE-SURGERY PO LIQD
296.0000 mL | Freq: Once | ORAL | Status: DC
  Filled 2022-09-03: qty 296

## 2022-09-03 MED ORDER — MENTHOL 3 MG MT LOZG: 1.0000 | LOZENGE | OROMUCOSAL | Status: DC | PRN

## 2022-09-03 MED ORDER — ONDANSETRON HCL 4 MG/2ML IJ SOLN
4.0000 mg | Freq: Four times a day (QID) | INTRAMUSCULAR | Status: DC | PRN
  Administered 2022-09-03 – 2022-09-05 (×4): 4 mg via INTRAVENOUS
  Filled 2022-09-03 (×4): qty 2

## 2022-09-03 MED ORDER — AMISULPRIDE (ANTIEMETIC) 5 MG/2ML IV SOLN
INTRAVENOUS | Status: AC
  Filled 2022-09-03: qty 4

## 2022-09-03 MED ORDER — ONDANSETRON HCL 4 MG/2ML IJ SOLN
INTRAMUSCULAR | Status: AC
  Filled 2022-09-03: qty 2

## 2022-09-03 MED ORDER — METHOCARBAMOL 1000 MG/10ML IJ SOLN: 1000.0000 mg | Freq: Four times a day (QID) | INTRAVENOUS | Status: DC | PRN

## 2022-09-03 MED ORDER — DIPHENHYDRAMINE HCL 12.5 MG/5ML PO ELIX: 12.5000 mg | ORAL_SOLUTION | Freq: Four times a day (QID) | ORAL | Status: DC | PRN

## 2022-09-03 MED ORDER — ONDANSETRON HCL 4 MG PO TABS
4.0000 mg | ORAL_TABLET | Freq: Four times a day (QID) | ORAL | Status: DC | PRN
  Administered 2022-09-06 (×2): 4 mg via ORAL
  Filled 2022-09-03 (×2): qty 1

## 2022-09-03 MED ORDER — ORAL CARE MOUTH RINSE: 15.0000 mL | Freq: Once | OROMUCOSAL | Status: AC

## 2022-09-03 SURGICAL SUPPLY — 106 items
APL PRP STRL LF DISP 70% ISPRP (MISCELLANEOUS)
APPLIER CLIP 5 13 M/L LIGAMAX5 (MISCELLANEOUS)
APPLIER CLIP ROT 10 11.4 M/L (STAPLE)
APR CLP MED LRG 11.4X10 (STAPLE)
APR CLP MED LRG 5 ANG JAW (MISCELLANEOUS)
BAG COUNTER SPONGE SURGICOUNT (BAG) ×2 IMPLANT
BAG SPNG CNTER NS LX DISP (BAG)
BLADE EXTENDED COATED 6.5IN (ELECTRODE) IMPLANT
CANNULA REDUCER 12-8 DVNC XI (CANNULA) IMPLANT
CELLS DAT CNTRL 66122 CELL SVR (MISCELLANEOUS) IMPLANT
CHLORAPREP W/TINT 26 (MISCELLANEOUS) IMPLANT
CLIP APPLIE 5 13 M/L LIGAMAX5 (MISCELLANEOUS) IMPLANT
CLIP APPLIE ROT 10 11.4 M/L (STAPLE) IMPLANT
COVER SURGICAL LIGHT HANDLE (MISCELLANEOUS) ×4 IMPLANT
COVER TIP SHEARS 8 DVNC (MISCELLANEOUS) ×2 IMPLANT
DEVICE TROCAR PUNCTURE CLOSURE (ENDOMECHANICALS) IMPLANT
DRAIN CHANNEL 19F RND (DRAIN) IMPLANT
DRAPE ARM DVNC X/XI (DISPOSABLE) ×8 IMPLANT
DRAPE COLUMN DVNC XI (DISPOSABLE) ×2 IMPLANT
DRAPE SURG IRRIG POUCH 19X23 (DRAPES) ×2 IMPLANT
DRIVER NDL LRG 8 DVNC XI (INSTRUMENTS) ×2 IMPLANT
DRIVER NDLE LRG 8 DVNC XI (INSTRUMENTS) ×1 IMPLANT
DRSG OPSITE POSTOP 4X10 (GAUZE/BANDAGES/DRESSINGS) IMPLANT
DRSG OPSITE POSTOP 4X6 (GAUZE/BANDAGES/DRESSINGS) IMPLANT
DRSG OPSITE POSTOP 4X8 (GAUZE/BANDAGES/DRESSINGS) IMPLANT
DRSG TEGADERM 2-3/8X2-3/4 SM (GAUZE/BANDAGES/DRESSINGS) ×10 IMPLANT
DRSG TEGADERM 4X4.75 (GAUZE/BANDAGES/DRESSINGS) IMPLANT
ELECT PENCIL ROCKER SW 15FT (MISCELLANEOUS) ×2 IMPLANT
ELECT REM PT RETURN 15FT ADLT (MISCELLANEOUS) ×2 IMPLANT
ENDOLOOP SUT PDS II 0 18 (SUTURE) IMPLANT
EVACUATOR SILICONE 100CC (DRAIN) IMPLANT
GAUZE SPONGE 2X2 8PLY STRL LF (GAUZE/BANDAGES/DRESSINGS) ×2 IMPLANT
GLOVE ECLIPSE 8.0 STRL XLNG CF (GLOVE) ×6 IMPLANT
GLOVE INDICATOR 8.0 STRL GRN (GLOVE) ×6 IMPLANT
GOWN SRG XL LVL 4 BRTHBL STRL (GOWNS) ×2 IMPLANT
GOWN STRL NON-REIN XL LVL4 (GOWNS) ×1
GOWN STRL REUS W/ TWL XL LVL3 (GOWN DISPOSABLE) ×8 IMPLANT
GOWN STRL REUS W/TWL XL LVL3 (GOWN DISPOSABLE) ×4
GRASPER SUT TROCAR 14GX15 (MISCELLANEOUS) IMPLANT
GRASPER TIP-UP FEN DVNC XI (INSTRUMENTS) ×2 IMPLANT
HOLDER FOLEY CATH W/STRAP (MISCELLANEOUS) ×2 IMPLANT
IRRIG SUCT STRYKERFLOW 2 WTIP (MISCELLANEOUS) ×1
IRRIGATION SUCT STRKRFLW 2 WTP (MISCELLANEOUS) ×2 IMPLANT
KIT PROCEDURE DVNC SI (MISCELLANEOUS) IMPLANT
KIT SIGMOIDOSCOPE (SET/KITS/TRAYS/PACK) IMPLANT
KIT TURNOVER KIT A (KITS) IMPLANT
NDL INSUFFLATION 14GA 120MM (NEEDLE) ×2 IMPLANT
NEEDLE INSUFFLATION 14GA 120MM (NEEDLE) ×1 IMPLANT
PACK CARDIOVASCULAR III (CUSTOM PROCEDURE TRAY) ×2 IMPLANT
PACK COLON (CUSTOM PROCEDURE TRAY) ×2 IMPLANT
PAD POSITIONING PINK XL (MISCELLANEOUS) ×2 IMPLANT
PROTECTOR NERVE ULNAR (MISCELLANEOUS) ×4 IMPLANT
RELOAD STAPLE 45 3.5 BLU DVNC (STAPLE) IMPLANT
RELOAD STAPLE 45 4.3 GRN DVNC (STAPLE) IMPLANT
RELOAD STAPLE 60 3.5 BLU DVNC (STAPLE) IMPLANT
RELOAD STAPLE 60 4.3 GRN DVNC (STAPLE) IMPLANT
RELOAD STAPLER 3.5X45 BLU DVNC (STAPLE) IMPLANT
RELOAD STAPLER 3.5X60 BLU DVNC (STAPLE) IMPLANT
RELOAD STAPLER 4.3X45 GRN DVNC (STAPLE) IMPLANT
RELOAD STAPLER 4.3X60 GRN DVNC (STAPLE) ×1 IMPLANT
RETRACTOR WND ALEXIS 18 MED (MISCELLANEOUS) IMPLANT
RTRCTR WOUND ALEXIS 18CM MED (MISCELLANEOUS)
SCISSORS LAP 5X35 DISP (ENDOMECHANICALS) ×2 IMPLANT
SCISSORS MNPLR CVD DVNC XI (INSTRUMENTS) ×2 IMPLANT
SEAL UNIV 5-12 XI (MISCELLANEOUS) ×8 IMPLANT
SEALER VESSEL EXT DVNC XI (MISCELLANEOUS) ×2 IMPLANT
SOL ELECTROSURG ANTI STICK (MISCELLANEOUS) ×1
SOLUTION ELECTROSURG ANTI STCK (MISCELLANEOUS) ×2 IMPLANT
SPIKE FLUID TRANSFER (MISCELLANEOUS) ×2 IMPLANT
STAPLER 45 SUREFORM DVNC (STAPLE) IMPLANT
STAPLER 60 SUREFORM DVNC (STAPLE) IMPLANT
STAPLER ECHELON POWER CIR 29 (STAPLE) IMPLANT
STAPLER ECHELON POWER CIR 31 (STAPLE) IMPLANT
STAPLER RELOAD 3.5X45 BLU DVNC (STAPLE)
STAPLER RELOAD 3.5X60 BLU DVNC (STAPLE)
STAPLER RELOAD 4.3X45 GRN DVNC (STAPLE)
STAPLER RELOAD 4.3X60 GRN DVNC (STAPLE) ×1
STOPCOCK 4 WAY LG BORE MALE ST (IV SETS) ×4 IMPLANT
SURGILUBE 2OZ TUBE FLIPTOP (MISCELLANEOUS) IMPLANT
SUT MNCRL AB 4-0 PS2 18 (SUTURE) ×2 IMPLANT
SUT PDS AB 1 CT1 27 (SUTURE) ×4 IMPLANT
SUT PROLENE 0 CT 2 (SUTURE) IMPLANT
SUT PROLENE 2 0 KS (SUTURE) IMPLANT
SUT PROLENE 2 0 SH DA (SUTURE) IMPLANT
SUT SILK 2 0 (SUTURE)
SUT SILK 2 0 SH CR/8 (SUTURE) IMPLANT
SUT SILK 2-0 18XBRD TIE 12 (SUTURE) IMPLANT
SUT SILK 3 0 (SUTURE)
SUT SILK 3 0 SH CR/8 (SUTURE) ×2 IMPLANT
SUT SILK 3-0 18XBRD TIE 12 (SUTURE) IMPLANT
SUT V-LOC BARB 180 2/0GR6 GS22 (SUTURE)
SUT VIC AB 3-0 SH 18 (SUTURE) IMPLANT
SUT VIC AB 3-0 SH 27 (SUTURE)
SUT VIC AB 3-0 SH 27XBRD (SUTURE) IMPLANT
SUT VICRYL 0 UR6 27IN ABS (SUTURE) ×2 IMPLANT
SUTURE V-LC BRB 180 2/0GR6GS22 (SUTURE) IMPLANT
SYR 20ML ECCENTRIC (SYRINGE) ×2 IMPLANT
SYS LAPSCP GELPORT 120MM (MISCELLANEOUS)
SYS WOUND ALEXIS 18CM MED (MISCELLANEOUS) ×1
SYSTEM LAPSCP GELPORT 120MM (MISCELLANEOUS) IMPLANT
SYSTEM WOUND ALEXIS 18CM MED (MISCELLANEOUS) ×2 IMPLANT
TOWEL OR NON WOVEN STRL DISP B (DISPOSABLE) ×2 IMPLANT
TRAY FOLEY MTR SLVR 16FR STAT (SET/KITS/TRAYS/PACK) ×2 IMPLANT
TROCAR ADV FIXATION 5X100MM (TROCAR) ×2 IMPLANT
TUBING CONNECTING 10 (TUBING) ×4 IMPLANT
TUBING INSUFFLATION 10FT LAP (TUBING) ×2 IMPLANT

## 2022-09-03 NOTE — H&P (Signed)
09/03/2022     REFERRING PHYSICIAN: Corbin Ade, MD  Patient Care Team: Corrie Mckusick, MD as PCP - General (Family Medicine) Michaell Cowing, Shawn Route, MD as Consulting Provider (General Surgery) Jena Gauss Gerrit Friends, MD (Gastroenterology) Wendall Stade, MD (Cardiovascular Disease) Sandrea Hughs, MD (Pulmonary Disease)  PROVIDER: Jarrett Soho, MD  [facility] MRN: [id number] DOB: 05/28/1950 09/03/2022  SUBJECTIVE  Chief Complaint: New Consultation (Barium enema sigmoid stricture,)   Emily Valentine is a 72 y.o. female who is seen today as an office consultation at the request of DrJena Gauss for evaluation of :colon stricture.   History of Present Illness:    72 year old woman who has had issues with constipation and bloating for quite some time. Is here with her daughter who is taking notes on her phone. Followed by Dr. Jena Gauss with gastroenterology. Had colonoscopy last year that showed a strictured area of the sigmoid that appeared smooth. Biopsies negative for malignancy. Suspected diverticular etiology. Patient recalls having at least a couple attacks of diverticulitis. Sounds like they are treated with antibiotics only. No prior perforation or abscess. She had a hysterectomy but no other abdominal surgeries. She has been on MiraLAX twice a day and Trulance and still feels constipated. May be moving her bowels once or twice a week at the most on that. She can gradually walk about 15 or 20 minutes. She has seen cardiology in the past for some coronary disease and bundle branch block. Had decent echocardiogram last year. Due for annual follow-up next month. She does get some heartburn and reflux issues. Usually when she lies down flat. She will take some occasional Tums. Not on any formal and acid medication. No dysphagia to solids or liquids. No postprandial nausea or vomiting or bloating. No problems with heavy or greasy foods. She has known calcified gallstones but has not had any  episodes of biliary colic or gallbladder attacks that she is aware of.   Ready for surgery  Medical History:  Past Medical History: Diagnosis Date Anemia Arthritis Asthma, unspecified asthma severity, unspecified whether complicated, unspecified whether persistent (HHS-HCC) GERD (gastroesophageal reflux disease) Hypertension Thyroid disease  Patient Active Problem List Diagnosis Stricture of sigmoid colon (CMS/HHS-HCC) History of diverticulitis of colon Chronic constipation Right bundle branch block  Past Surgical History: Procedure Laterality Date HYSTERECTOMY SPINE SURGERY   Allergies Allergen Reactions Codeine Other (See Comments) and Nausea Oxycodone Other (See Comments) and Nausea And Vomiting  Current Outpatient Medications on File Prior to Visit Medication Sig Dispense Refill BREZTRI AEROSPHERE 160-9-4.8 mcg/actuation inhaler Inhale into the lungs cyanocobalamin (VITAMIN B12) 1,000 mcg/mL injection Inject into the muscle folic acid (FOLVITE) 1 MG tablet Take 1 mg by mouth once daily levothyroxine (SYNTHROID) 88 MCG tablet Take by mouth losartan (COZAAR) 100 MG tablet Take 100 mg by mouth once daily methotrexate (RHEUMATREX) 2.5 MG tablet Take by mouth metoprolol succinate (TOPROL-XL) 25 MG XL tablet Take 1 tablet by mouth once daily TRULANCE 3 mg tablet Take 1 tablet by mouth once daily  No current facility-administered medications on file prior to visit.  Family History Problem Relation Age of Onset Skin cancer Mother High blood pressure (Hypertension) Mother Colon cancer Father Skin cancer Father High blood pressure (Hypertension) Father Skin cancer Sister Skin cancer Brother   Social History  Tobacco Use Smoking Status Former Current packs/day: 0.00 Types: Cigarettes Quit date: 2021 Years since quitting: 3.3 Smokeless Tobacco Never   Social History  Socioeconomic History Marital status: Widowed Tobacco Use Smoking status:  Former Current packs/day:  0.00 Types: Cigarettes Quit date: 2021 Years since quitting: 3.3 Smokeless tobacco: Never Substance and Sexual Activity Alcohol use: Not Currently Drug use: Never  Social Determinants of Health  Financial Resource Strain: Medium Risk (01/22/2020) Received from [facility] Overall Financial Resource Strain (CARDIA) Difficulty of Paying Living Expenses: Somewhat hard Food Insecurity: No Food Insecurity (01/22/2020) Received from [facility] Hunger Vital Sign Worried About Running Out of Food in the Last Year: Never true Ran Out of Food in the Last Year: Never true Transportation Needs: No Transportation Needs (01/22/2020) Received from [facility] - Transportation Lack of Transportation (Medical): No Lack of Transportation (Non-Medical): No Physical Activity: Insufficiently Active (01/22/2020) Received from [facility] Exercise Vital Sign Days of Exercise per Week: 1 day Minutes of Exercise per Session: 60 min Stress: Stress Concern Present (01/22/2020) Received from [facility] - Occupational Stress Questionnaire Feeling of Stress : To some extent Social Connections: Socially Integrated (01/22/2020) Received from [facility] Social Connection and Isolation Panel [NHANES] Frequency of Communication with Friends and Family: More than three times a week Frequency of Social Gatherings with Friends and Family: Once a week Attends Religious Services: More than 4 times per year Active Member of [employer] or Organizations: Yes Attends Engineer, structural: More than 4 times per year Marital Status: Married  ############################################################  Review of Systems: A complete review of systems (ROS) was obtained from the patient. We have reviewed this information and discussed as appropriate with the patient. See HPI as well for other pertinent ROS.  Constitutional: No fevers,  chills, sweats. Weight stable Eyes: No vision changes, No discharge HENT: No sore throats, nasal drainage Lymph: No neck swelling, No bruising easily Pulmonary: No cough, productive sputum CV: No orthopnea, PND . No exertional chest/neck/shoulder/arm pain. Patient can walk 20 minutes gradually.  GI: No personal nor family history of GI/colon cancer, inflammatory bowel disease, irritable bowel syndrome, allergy such as Celiac Sprue, dietary/dairy problems, colitis, ulcers nor gastritis. No recent sick contacts/gastroenteritis. No travel outside the country. No changes in diet.  Renal: No UTIs, No hematuria Genital: No drainage, bleeding, masses Musculoskeletal: No severe joint pain. Good ROM major joints Skin: No sores or lesions Heme/Lymph: No easy bleeding. No swollen lymph nodes Neuro: No active seizures. No facial droop Psych: No hallucinations. No agitation  OBJECTIVE  Vitals: 07/22/22 1511 BP: (!) 166/93 Pulse: 88 Temp: 36.1 C (97 F) SpO2: 98% Weight: 64.9 kg (143 lb) Height: 157.5 cm (5\' 2" )  Body mass index is 26.16 kg/m.  PHYSICAL EXAM:  Constitutional: Not cachectic. Hygeine adequate. Vitals signs as above. Eyes: Wears glasses - vision corrected,Pupils reactive, normal extraocular movements. Sclera nonicteric Neuro: CN II-XII intact. No major focal sensory defects. No major motor deficits. Lymph: No head/neck/groin lymphadenopathy Psych: No severe agitation. No severe anxiety. Judgment & insight Adequate, Oriented x4, HENT: Normocephalic, Mucus membranes moist. No thrush. Hearing: adequate Neck: Supple, No tracheal deviation. No obvious thyromegaly Chest: No pain to chest wall compression. Good respiratory excursion. No audible wheezing CV: Pulses intact. regular. No major extremity edema Ext: No obvious deformity or contracture. Edema: Not present. No cyanosis Skin: No major subcutaneous nodules. Warm and dry Musculoskeletal: Severe joint rigidity not present.  No obvious clubbing. No digital petechiae. Mobility: no assist device moving easily without restrictions  Abdomen: Flat Soft. Mildly distended. Mild discomfort in epigastric region.Marland Kitchen Hernia: Not present. Diastasis recti: Not present. No hepatomegaly. No splenomegaly.  Genital/Pelvic: Inguinal hernia: Not present. Inguinal lymph nodes: without  lymphadenopathy nor hidradenitis.  Rectal: (Deferred)    ###################################################################  Labs, Imaging and Diagnostic Testing:  Located in 'Care Everywhere' section of Epic EMR chart  [facility] NOTES:  Located in 'Care Everywhere' section of Epic EMR chart  SURGERY NOTES:  Not applicable  PATHOLOGY:  Located in 'Care Everywhere' section of Epic EMR chart  Assessment and Plan: DIAGNOSES:  Diagnoses and all orders for this visit:  Stricture of sigmoid colon (CMS/HHS-HCC) - polyethylene glycol (MIRALAX) powder; Take 233.75 g by mouth once for 1 dose Take according to your procedure prep instructions.  Chronic constipation  History of diverticulitis of colon - polyethylene glycol (MIRALAX) powder; Take 233.75 g by mouth once for 1 dose Take according to your procedure prep instructions.  Right bundle branch block  Other orders - bisacodyL (DULCOLAX) 5 mg EC tablet; Take 4 tablets (20 mg total) by mouth once daily as needed for Constipation for up to 1 dose - metroNIDAZOLE (FLAGYL) 500 MG tablet; Take 2 tablets (1,000 mg total) by mouth 3 (three) times daily for 3 doses SEE BOWEL PREP INSTRUCTIONS: Take 2 tablets at 2pm, 3pm, and 10pm the day prior to your colon operation. - neomycin 500 mg tablet; Take 2 tablets (1,000 mg total) by mouth 3 (three) times daily for 3 doses SEE BOWEL PREP INSTRUCTIONS: Take 2 tablets at 2pm, 3pm, and 10pm the day prior to your colon operation.    ASSESSMENT/PLAN  72 year old woman with worsening constipation and history of prior diverticulitis with  documented sigmoid stricture by colonoscopy and enema. Still with crampy abdominal pain and persistent constipation despite Trulance and MiraLAX twice daily. I worry that she is having end-stage diverticulitis with a significant stricture too narrow for scope to pass and she is at risk for developing total colon obstruction. She gets crampiness and bloating.  I showed the patient and her daughter pictures of the enema and CT scans to know the narrowing stricturing to confirm that I had reviewed the films and help them. Also reviewed technique and pathophysiology of diverticulitis and resection with handout about diverticulitis.  I recommended considering robotic segmental colonic resection. Hopefully can do immediate anastomosis. Will mark for possible ostomy just in case given the fact her colon is chronically distended. Patient has a distant history of COPD but is quit smoking for the past 5 years and seems to have a tolerable performance status.  Cleared by cardiology  The anatomy & physiology of the digestive tract was discussed. The pathophysiology of the colon was discussed. Natural history risks without surgery was discussed. I feel the risks of no intervention will lead to serious problems that outweigh the operative risks; therefore, I recommended a partial colectomy to remove the pathology. Minimally invasive (Robotic/Laparoscopic) & open techniques were discussed.  Risks such as bleeding, infection, abscess, leak, reoperation, injury to other organs, need for repair of tissues / organs, possible ostomy, hernia, heart attack, stroke, death, and other risks were discussed. I noted a good likelihood this will help address the problem. Goals of post-operative recovery were discussed as well. Need for adequate nutrition, daily bowel regimen and healthy physical activity, to optimize recovery was noted as well. We will work to minimize complications. Educational materials were available as well.  Questions were answered. The patient especially her daughter expressed understanding & wish to proceed with surgery.

## 2022-09-03 NOTE — Anesthesia Procedure Notes (Signed)
Procedure Name: Intubation Date/Time: 09/03/2022 1:10 PM  Performed by: Deri Fuelling, CRNAPre-anesthesia Checklist: Patient identified, Emergency Drugs available, Suction available and Patient being monitored Patient Re-evaluated:Patient Re-evaluated prior to induction Oxygen Delivery Method: Circle system utilized Preoxygenation: Pre-oxygenation with 100% oxygen Induction Type: IV induction Ventilation: Mask ventilation without difficulty Laryngoscope Size: Mac and 3 Grade View: Grade I Tube type: Oral Number of attempts: 1 Airway Equipment and Method: Stylet and Oral airway Placement Confirmation: ETT inserted through vocal cords under direct vision, positive ETCO2 and breath sounds checked- equal and bilateral Secured at: 21 cm Tube secured with: Tape Dental Injury: Teeth and Oropharynx as per pre-operative assessment

## 2022-09-03 NOTE — Anesthesia Preprocedure Evaluation (Addendum)
Anesthesia Evaluation  Patient identified by MRN, date of birth, ID band Patient awake    Reviewed: Allergy & Precautions, H&P , NPO status , Patient's Chart, lab work & pertinent test results  Airway Mallampati: II  TM Distance: >3 FB Neck ROM: Full    Dental  (+) Missing, Chipped, Dental Advisory Given   Pulmonary asthma , pneumonia, COPD, former smoker   Pulmonary exam normal breath sounds clear to auscultation       Cardiovascular Exercise Tolerance: Good hypertension, Pt. on medications and Pt. on home beta blockers Normal cardiovascular exam+ dysrhythmias + Valvular Problems/Murmurs (mild-mod TR)  Rhythm:Regular Rate:Normal  Echo 12/2020  1. Left ventricular ejection fraction, by estimation, is 70 to 75%. The left ventricle has hyperdynamic function. The left ventricle has no regional wall motion abnormalities. There is moderate left ventricular hypertrophy. Left ventricular diastolic parameters were normal.   2. Right ventricular systolic function is mildly reduced. The right ventricular size is moderately enlarged. There is normal pulmonary artery systolic pressure. The estimated right ventricular systolic pressure is 30.9 mmHg.   3. Right atrial size was mild to moderately dilated.   4. The mitral valve is grossly normal. Trivial mitral valve regurgitation.   5. Tricuspid valve regurgitation is mild to moderate.   6. The aortic valve is tricuspid. Aortic valve regurgitation is not visualized. No aortic stenosis is present. Aortic valve mean gradient measures 4.0 mmHg.   7. The inferior vena cava is normal in size with greater than 50% respiratory variability, suggesting right atrial pressure of 3 mmHg.   Comparison(s): Prior images unable to be directly viewed.     Neuro/Psych negative neurological ROS  negative psych ROS   GI/Hepatic ,GERD  ,,(+) Hepatitis -  Endo/Other  Hypothyroidism    Renal/GU negative Renal ROS      Musculoskeletal  (+) Arthritis ,    Abdominal   Peds  Hematology  (+) Blood dyscrasia, anemia   Anesthesia Other Findings   Reproductive/Obstetrics negative OB ROS                              Anesthesia Physical Anesthesia Plan  ASA: 3  Anesthesia Plan: General   Post-op Pain Management: Tylenol PO (pre-op)* and Lidocaine infusion*   Induction: Intravenous  PONV Risk Score and Plan: 4 or greater and Ondansetron, Dexamethasone and Treatment may vary due to age or medical condition  Airway Management Planned: Oral ETT  Additional Equipment:   Intra-op Plan:   Post-operative Plan: Extubation in OR  Informed Consent: I have reviewed the patients History and Physical, chart, labs and discussed the procedure including the risks, benefits and alternatives for the proposed anesthesia with the patient or authorized representative who has indicated his/her understanding and acceptance.     Dental advisory given  Plan Discussed with: CRNA  Anesthesia Plan Comments: (2 x PIV)         Anesthesia Quick Evaluation

## 2022-09-03 NOTE — Interval H&P Note (Signed)
History and Physical Interval Note:  09/03/2022 11:14 AM  Emily Valentine  has presented today for surgery, with the diagnosis of STRICTURE OF COLON.  The various methods of treatment have been discussed with the patient and family. After consideration of risks, benefits and other options for treatment, the patient has consented to  Procedure(s) with comments: ROBOTIC RESECTION OF COLON: SIGMOID (N/A) - GEN w/ERAS PATHWAY RIGID PROCTOSCOPY (N/A) as a surgical intervention.  The patient's history has been reviewed, patient examined, no change in status, stable for surgery.  I have reviewed the patient's chart and labs.  Questions were answered to the patient's satisfaction.    I have re-reviewed the the patient's records, history, medications, and allergies.  I have re-examined the patient.  I again discussed intraoperative plans and goals of post-operative recovery.  The patient agrees to proceed.  Emily Valentine  1950/10/16 [id number]  Patient Care Team: Assunta Found, MD as PCP - General (Family Medicine) Wendall Stade, MD as PCP - Cardiology (Cardiology) Jena Gauss Gerrit Friends, MD (Gastroenterology) Lurena Nida, MD as Consulting Physician (Rheumatology) Karie Soda, MD as Consulting Physician (Colon and Rectal Surgery)  Patient Active Problem List   Diagnosis Date Noted   History of diverticulitis of colon 07/22/2022   Stricture of sigmoid colon (HCC) 07/22/2022   Hoarseness 01/29/2022   Impacted cerumen of right ear 01/29/2022   Laryngopharyngeal reflux (LPR) 01/29/2022   Pneumonia due to parainfluenza virus 08/04/2021   Hypokalemia 08/03/2021   Hypomagnesemia 08/03/2021   Hypothyroidism 08/03/2021   Sepsis (HCC)    Hypotension due to hypovolemia    COPD with acute exacerbation (HCC) 07/30/2020   Constipation 04/19/2018   Memory changes 06/26/2017   Mass of right breast 01/17/2015   Perimenopausal vasomotor symptoms 09/05/2014   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   Palpitations  10/04/2013   Right bundle branch block 08/28/2011   Cigarette smoker 08/28/2011   Essential hypertension 08/28/2011   Encounter for screening colonoscopy 10/14/2010    Past Medical History:  Diagnosis Date   Asthma    Chronic constipation    COVID-19 11/2020   Diverticulitis 08/2016   GERD (gastroesophageal reflux disease)    Hepatitis    Teens/20s   History of echocardiogram    Echo (8/15): Vigorous LV function, EF 65-70%, normal wall motion, grade 1 diastolic dysfunction, mild to moderate TR, PASP 31 mm Hg   Hx of cardiovascular stress test    ETT-Myoview (8/15): no ischemia, EF 56%, normal study   Hypertension    Hypothyroid    Pernicious anemia    Pneumonia    2022   PVC's (premature ventricular contractions)    Rheumatoid arthritis (HCC)    Right bundle branch block    S/P colonoscopy 2001, 2004   2001: hyperplastic polyp, 2004: normal, inflammatory polyp    Past Surgical History:  Procedure Laterality Date   ABDOMINAL HYSTERECTOMY     BACK SURGERY     BIOPSY  12/08/2021   Procedure: BIOPSY;  Surgeon: Corbin Ade, MD;  [facility];  Service: Endoscopy;;   BREAST BIOPSY Right 10/29/2010   BREAST BIOPSY Right 11/27/2014   BREAST EXCISIONAL BIOPSY Right 02/21/2015   BREAST LUMPECTOMY WITH RADIOACTIVE SEED LOCALIZATION Right 02/21/2015   Procedure: RIGHT BREAST LUMPECTOMY WITH RADIOACTIVE SEED LOCALIZATION;  Surgeon: Emelia Loron, MD;  Location: [facility];  Service: General;  Laterality: Right;   COLONOSCOPY  11/10/2010   Procedure: COLONOSCOPY;  Surgeon: Corbin Ade, MD;  [facility];  Service: Endoscopy;  Laterality: N/A;   COLONOSCOPY N/A 09/28/2016   Dr. Jena Gauss; Diverticulosis with evidence of recent diverticulitis.  Next colonoscopy in 2023 given history of tubular adenomas in the past.   COLONOSCOPY WITH PROPOFOL N/A 12/08/2021   Procedure: COLONOSCOPY WITH PROPOFOL;  Surgeon: Corbin Ade, MD;  [facility];  Service: Endoscopy;  Laterality: N/A;  11:00am   POLYPECTOMY  12/08/2021   Procedure: POLYPECTOMY;  Surgeon: Corbin Ade, MD;  [facility];  Service: Endoscopy;;   SUBMUCOSAL TATTOO INJECTION  12/08/2021   Procedure: SUBMUCOSAL TATTOO INJECTION;  Surgeon: Corbin Ade, MD;  [facility];  Service: Endoscopy;;   THYROIDECTOMY      Social History   Socioeconomic History   Marital status: Widowed    Spouse name: Not on file   Number of children: Not on file   Years of education: Not on file   Highest education level: Not on file  Occupational History   Not on file  Tobacco Use   Smoking status: Former    Packs/day: 0.50    Years: 35.00    Additional pack years: 0.00    Total pack years: 17.50    Types: Cigarettes   Smokeless tobacco: Former   Tobacco comments:    3-4 cigs per day 07/30/20//lmr  Vaping Use   Vaping Use: Never used  Substance and Sexual Activity   Alcohol use: No   Drug use: No   Sexual activity: Yes    Birth control/protection: Surgical  Other Topics Concern   Not on file  Social History Narrative   Not on file   Social Determinants of Health   Financial Resource Strain: Medium Risk (01/22/2020)   Overall Financial Resource Strain (CARDIA)    Difficulty of Paying Living Expenses: Somewhat hard  Food Insecurity: No Food Insecurity (01/22/2020)   Hunger Vital Sign    Worried About Running Out of Food in the Last Year: Never true    Ran Out of Food in the Last Year: Never true  Transportation Needs: No Transportation Needs (01/22/2020)   PRAPARE - Administrator, Civil Service (Medical): No    Lack of Transportation (Non-Medical): No  Physical Activity: Insufficiently Active (01/22/2020)   Exercise Vital Sign    Days of Exercise per Week: 1 day    Minutes of Exercise per Session: 60 min  Stress: Stress Concern Present (01/22/2020)   [employer] of Occupational Health - Occupational Stress  Questionnaire    Feeling of Stress : To some extent  Social Connections: Socially Integrated (01/22/2020)   Social Connection and Isolation Panel [NHANES]    Frequency of Communication with Friends and Family: More than three times a week    Frequency of Social Gatherings with Friends and Family: Once a week    Attends Religious Services: More than 4 times per year    Active Member of [employer] or Organizations: Yes    Attends Engineer, structural: More than 4 times per year    Marital Status: Married  Catering manager Violence: Not At Risk (01/22/2020)   Humiliation, Afraid, Rape, and Kick questionnaire    Fear of Current or Ex-Partner: No    Emotionally Abused: No    Physically Abused: No    Sexually Abused: No    Family History  Problem Relation Age of Onset   Stomach cancer Mother        deceased   Anemia Mother  Cancer Mother    Hypertension Mother    Thyroid disease Mother    Colon cancer Father        diagnosed age 3, died at age 15   Anemia Father    Cancer Father    Hypertension Father    Anemia Sister    Cancer Brother    Diabetes Sister    Hypertension Sister    Hypertension Brother    Thyroid disease Brother    Thyroid disease Sister     Medications Prior to Admission  Medication Sig Dispense Refill Last Dose   acetaminophen (TYLENOL) 500 MG tablet Take 500 mg by mouth every 6 (six) hours as needed for moderate pain.   Past Week   albuterol (VENTOLIN HFA) 108 (90 Base) MCG/ACT inhaler Inhale 2 puffs into the lungs every 6 (six) hours as needed for wheezing or shortness of breath.   09/02/2022   aspirin EC 81 MG tablet Take 81 mg by mouth in the morning. Swallow whole.   08/23/2022   Budeson-Glycopyrrol-Formoterol (BREZTRI AEROSPHERE) 160-9-4.8 MCG/ACT AERO Inhale 2 puffs into the lungs 2 (two) times daily.   09/02/2022   calcium carbonate (TUMS - DOSED IN MG ELEMENTAL CALCIUM) 500 MG chewable tablet Chew 2 tablets by mouth daily as needed for indigestion or  heartburn.   Past Week   cyanocobalamin (,VITAMIN B-12,) 1000 MCG/ML injection Inject 1,000 mcg into the muscle every 30 (thirty) days.   Past Month   diphenhydramine-acetaminophen (TYLENOL PM) 25-500 MG TABS tablet Take 1-2 tablets by mouth at bedtime as needed (pain.).   08/31/2022   folic acid (FOLVITE) 1 MG tablet Take 1 mg by mouth in the morning.   09/02/2022   hydrochlorothiazide (HYDRODIURIL) 25 MG tablet TAKE (1) TABLET BY MOUTH ONCE DAILY. 90 tablet 3 09/02/2022   levothyroxine (SYNTHROID, LEVOTHROID) 88 MCG tablet Take 88 mcg by mouth daily before breakfast.   09/03/2022 at 0800   losartan (COZAAR) 100 MG tablet Take 100 mg by mouth in the morning.   09/02/2022   methotrexate (RHEUMATREX) 2.5 MG tablet Take 10 mg by mouth every Monday.   08/31/2022   metoprolol succinate (TOPROL-XL) 25 MG 24 hr tablet TAKE (1) TABLET BY MOUTH DAILY. 90 tablet 3 09/03/2022 at 0800   Multiple Vitamin (MULTIVITAMIN WITH MINERALS) TABS tablet Take 1 tablet by mouth in the morning.   Past Week   TRULANCE 3 MG TABS TAKE (1) TABLET BY MOUTH ONCE DAILY. 30 tablet 3 09/01/2022   Wheat Dextrin (BENEFIBER) POWD Take 1 Dose by mouth See admin instructions. Take 1 dose = 2 teaspoons by mouth once to twice daily   09/01/2022   pantoprazole (PROTONIX) 40 MG tablet Take 1 tablet (40 mg total) by mouth daily. (Patient not taking: Reported on 08/19/2022) 30 tablet 11 Not Taking    Current Facility-Administered Medications  Medication Dose Route Frequency Provider Last Rate Last Admin   bupivacaine liposome (EXPAREL) 1.3 % injection 266 mg  20 mL Infiltration Once Karie Soda, MD       cefoTEtan (CEFOTAN) 2 g in sodium chloride 0.9 % 100 mL IVPB  2 g Intravenous On Call to OR Karie Soda, MD       Melene Muller ON 09/04/2022] feeding supplement (ENSURE PRE-SURGERY) liquid 296 mL  296 mL Oral Once Karie Soda, MD       feeding supplement (ENSURE PRE-SURGERY) liquid 592 mL  592 mL Oral Once Karie Soda, MD       lactated ringers  infusion  Intravenous Continuous Val Eagle, MD         Allergies  Allergen Reactions   Codeine Nausea Only   Oxycontin [Oxycodone] Nausea And Vomiting    Ht 5\' 2"  (1.575 m)   Wt 63.5 kg   BMI 25.61 kg/m   Labs: No results found for this or any previous visit (from the past 48 hour(s)).  Imaging / Studies: No results found.   Ardeth Sportsman, M.D., F.A.C.S. Gastrointestinal and Minimally Invasive Surgery [facility] [address] [place], [place] [address] [phone] Main / Paging  09/03/2022 11:14 AM    Ardeth Sportsman

## 2022-09-03 NOTE — Interval H&P Note (Signed)
History and Physical Interval Note:  09/03/2022 11:18 AM  Emily Valentine  has presented today for surgery, with the diagnosis of STRICTURE OF COLON.  The various methods of treatment have been discussed with the patient and family. After consideration of risks, benefits and other options for treatment, the patient has consented to  Procedure(s) with comments: ROBOTIC RESECTION OF COLON: SIGMOID (N/A) - GEN w/ERAS PATHWAY RIGID PROCTOSCOPY (N/A) as a surgical intervention.  The patient's history has been reviewed, patient examined, no change in status, stable for surgery.  I have reviewed the patient's chart and labs.  Questions were answered to the patient's satisfaction.    I have re-reviewed the the patient's records, history, medications, and allergies.  I have re-examined the patient.  I again discussed intraoperative plans and goals of post-operative recovery.  The patient agrees to proceed.  Emily Valentine  05-03-1950 [id number]  Patient Care Team: Assunta Found, MD as PCP - General (Family Medicine) Wendall Stade, MD as PCP - Cardiology (Cardiology) Jena Gauss Gerrit Friends, MD (Gastroenterology) Lurena Nida, MD as Consulting Physician (Rheumatology) Karie Soda, MD as Consulting Physician (Colon and Rectal Surgery)  Patient Active Problem List   Diagnosis Date Noted   History of diverticulitis of colon 07/22/2022   Stricture of sigmoid colon (HCC) 07/22/2022   Hoarseness 01/29/2022   Impacted cerumen of right ear 01/29/2022   Laryngopharyngeal reflux (LPR) 01/29/2022   Pneumonia due to parainfluenza virus 08/04/2021   Hypokalemia 08/03/2021   Hypomagnesemia 08/03/2021   Hypothyroidism 08/03/2021   Sepsis (HCC)    Hypotension due to hypovolemia    COPD with acute exacerbation (HCC) 07/30/2020   Constipation 04/19/2018   Memory changes 06/26/2017   Mass of right breast 01/17/2015   Perimenopausal vasomotor symptoms 09/05/2014   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   Palpitations  10/04/2013   Right bundle branch block 08/28/2011   Cigarette smoker 08/28/2011   Essential hypertension 08/28/2011   Encounter for screening colonoscopy 10/14/2010    Past Medical History:  Diagnosis Date   Asthma    Chronic constipation    COVID-19 11/2020   Diverticulitis 08/2016   GERD (gastroesophageal reflux disease)    Hepatitis    Teens/20s   History of echocardiogram    Echo (8/15): Vigorous LV function, EF 65-70%, normal wall motion, grade 1 diastolic dysfunction, mild to moderate TR, PASP 31 mm Hg   Hx of cardiovascular stress test    ETT-Myoview (8/15): no ischemia, EF 56%, normal study   Hypertension    Hypothyroid    Pernicious anemia    Pneumonia    2022   PVC's (premature ventricular contractions)    Rheumatoid arthritis (HCC)    Right bundle branch block    S/P colonoscopy 2001, 2004   2001: hyperplastic polyp, 2004: normal, inflammatory polyp    Past Surgical History:  Procedure Laterality Date   ABDOMINAL HYSTERECTOMY     BACK SURGERY     BIOPSY  12/08/2021   Procedure: BIOPSY;  Surgeon: Corbin Ade, MD;  [facility];  Service: Endoscopy;;   BREAST BIOPSY Right 10/29/2010   BREAST BIOPSY Right 11/27/2014   BREAST EXCISIONAL BIOPSY Right 02/21/2015   BREAST LUMPECTOMY WITH RADIOACTIVE SEED LOCALIZATION Right 02/21/2015   Procedure: RIGHT BREAST LUMPECTOMY WITH RADIOACTIVE SEED LOCALIZATION;  Surgeon: Emelia Loron, MD;  Location: [facility];  Service: General;  Laterality: Right;   COLONOSCOPY  11/10/2010   Procedure: COLONOSCOPY;  Surgeon: Corbin Ade, MD;  [facility];  Service: Endoscopy;  Laterality: N/A;   COLONOSCOPY N/A 09/28/2016   Dr. Jena Gauss; Diverticulosis with evidence of recent diverticulitis.  Next colonoscopy in 2023 given history of tubular adenomas in the past.   COLONOSCOPY WITH PROPOFOL N/A 12/08/2021   Procedure: COLONOSCOPY WITH PROPOFOL;  Surgeon: Corbin Ade, MD;  [facility];  Service: Endoscopy;  Laterality: N/A;  11:00am   POLYPECTOMY  12/08/2021   Procedure: POLYPECTOMY;  Surgeon: Corbin Ade, MD;  [facility];  Service: Endoscopy;;   SUBMUCOSAL TATTOO INJECTION  12/08/2021   Procedure: SUBMUCOSAL TATTOO INJECTION;  Surgeon: Corbin Ade, MD;  [facility];  Service: Endoscopy;;   THYROIDECTOMY      Social History   Socioeconomic History   Marital status: Widowed    Spouse name: Not on file   Number of children: Not on file   Years of education: Not on file   Highest education level: Not on file  Occupational History   Not on file  Tobacco Use   Smoking status: Former    Packs/day: 0.50    Years: 35.00    Additional pack years: 0.00    Total pack years: 17.50    Types: Cigarettes   Smokeless tobacco: Former   Tobacco comments:    3-4 cigs per day 07/30/20//lmr  Vaping Use   Vaping Use: Never used  Substance and Sexual Activity   Alcohol use: No   Drug use: No   Sexual activity: Yes    Birth control/protection: Surgical  Other Topics Concern   Not on file  Social History Narrative   Not on file   Social Determinants of Health   Financial Resource Strain: Medium Risk (01/22/2020)   Overall Financial Resource Strain (CARDIA)    Difficulty of Paying Living Expenses: Somewhat hard  Food Insecurity: No Food Insecurity (01/22/2020)   Hunger Vital Sign    Worried About Running Out of Food in the Last Year: Never true    Ran Out of Food in the Last Year: Never true  Transportation Needs: No Transportation Needs (01/22/2020)   PRAPARE - Administrator, Civil Service (Medical): No    Lack of Transportation (Non-Medical): No  Physical Activity: Insufficiently Active (01/22/2020)   Exercise Vital Sign    Days of Exercise per Week: 1 day    Minutes of Exercise per Session: 60 min  Stress: Stress Concern Present (01/22/2020)   [employer] of Occupational Health - Occupational Stress  Questionnaire    Feeling of Stress : To some extent  Social Connections: Socially Integrated (01/22/2020)   Social Connection and Isolation Panel [NHANES]    Frequency of Communication with Friends and Family: More than three times a week    Frequency of Social Gatherings with Friends and Family: Once a week    Attends Religious Services: More than 4 times per year    Active Member of [employer] or Organizations: Yes    Attends Engineer, structural: More than 4 times per year    Marital Status: Married  Catering manager Violence: Not At Risk (01/22/2020)   Humiliation, Afraid, Rape, and Kick questionnaire    Fear of Current or Ex-Partner: No    Emotionally Abused: No    Physically Abused: No    Sexually Abused: No    Family History  Problem Relation Age of Onset   Stomach cancer Mother        deceased   Anemia Mother  Cancer Mother    Hypertension Mother    Thyroid disease Mother    Colon cancer Father        diagnosed age 68, died at age 41   Anemia Father    Cancer Father    Hypertension Father    Anemia Sister    Cancer Brother    Diabetes Sister    Hypertension Sister    Hypertension Brother    Thyroid disease Brother    Thyroid disease Sister     Medications Prior to Admission  Medication Sig Dispense Refill Last Dose   acetaminophen (TYLENOL) 500 MG tablet Take 500 mg by mouth every 6 (six) hours as needed for moderate pain.   Past Week   albuterol (VENTOLIN HFA) 108 (90 Base) MCG/ACT inhaler Inhale 2 puffs into the lungs every 6 (six) hours as needed for wheezing or shortness of breath.   09/02/2022   aspirin EC 81 MG tablet Take 81 mg by mouth in the morning. Swallow whole.   08/23/2022   Budeson-Glycopyrrol-Formoterol (BREZTRI AEROSPHERE) 160-9-4.8 MCG/ACT AERO Inhale 2 puffs into the lungs 2 (two) times daily.   09/02/2022   calcium carbonate (TUMS - DOSED IN MG ELEMENTAL CALCIUM) 500 MG chewable tablet Chew 2 tablets by mouth daily as needed for indigestion or  heartburn.   Past Week   cyanocobalamin (,VITAMIN B-12,) 1000 MCG/ML injection Inject 1,000 mcg into the muscle every 30 (thirty) days.   Past Month   diphenhydramine-acetaminophen (TYLENOL PM) 25-500 MG TABS tablet Take 1-2 tablets by mouth at bedtime as needed (pain.).   08/31/2022   folic acid (FOLVITE) 1 MG tablet Take 1 mg by mouth in the morning.   09/02/2022   hydrochlorothiazide (HYDRODIURIL) 25 MG tablet TAKE (1) TABLET BY MOUTH ONCE DAILY. 90 tablet 3 09/02/2022   levothyroxine (SYNTHROID, LEVOTHROID) 88 MCG tablet Take 88 mcg by mouth daily before breakfast.   09/03/2022 at 0800   losartan (COZAAR) 100 MG tablet Take 100 mg by mouth in the morning.   09/02/2022   methotrexate (RHEUMATREX) 2.5 MG tablet Take 10 mg by mouth every Monday.   08/31/2022   metoprolol succinate (TOPROL-XL) 25 MG 24 hr tablet TAKE (1) TABLET BY MOUTH DAILY. 90 tablet 3 09/03/2022 at 0800   Multiple Vitamin (MULTIVITAMIN WITH MINERALS) TABS tablet Take 1 tablet by mouth in the morning.   Past Week   TRULANCE 3 MG TABS TAKE (1) TABLET BY MOUTH ONCE DAILY. 30 tablet 3 09/01/2022   Wheat Dextrin (BENEFIBER) POWD Take 1 Dose by mouth See admin instructions. Take 1 dose = 2 teaspoons by mouth once to twice daily   09/01/2022   pantoprazole (PROTONIX) 40 MG tablet Take 1 tablet (40 mg total) by mouth daily. (Patient not taking: Reported on 08/19/2022) 30 tablet 11 Not Taking    Current Facility-Administered Medications  Medication Dose Route Frequency Provider Last Rate Last Admin   bupivacaine liposome (EXPAREL) 1.3 % injection 266 mg  20 mL Infiltration Once Karie Soda, MD       cefoTEtan (CEFOTAN) 2 g in sodium chloride 0.9 % 100 mL IVPB  2 g Intravenous On Call to OR Karie Soda, MD       Melene Muller ON 09/04/2022] feeding supplement (ENSURE PRE-SURGERY) liquid 296 mL  296 mL Oral Once Karie Soda, MD       feeding supplement (ENSURE PRE-SURGERY) liquid 592 mL  592 mL Oral Once Karie Soda, MD       lactated ringers  infusion  Intravenous Continuous Val Eagle, MD         Allergies  Allergen Reactions   Codeine Nausea Only   Oxycontin [Oxycodone] Nausea And Vomiting    Ht 5\' 2"  (1.575 m)   Wt 63.5 kg   BMI 25.61 kg/m   Labs: No results found for this or any previous visit (from the past 48 hour(s)).  Imaging / Studies: No results found.   Ardeth Sportsman, M.D., F.A.C.S. Gastrointestinal and Minimally Invasive Surgery [facility] [address] [place], [place] [address] [phone] Main / Paging  09/03/2022 11:18 AM    Ardeth Sportsman

## 2022-09-03 NOTE — Transfer of Care (Signed)
Immediate Anesthesia Transfer of Care Note  Patient: Emily Valentine  Procedure(s) Performed: ROBOTIC RESECTION OF COLON: SIGMOID RIGID PROCTOSCOPY  Patient Location: PACU  Anesthesia Type:General  Level of Consciousness: awake and alert   Airway & Oxygen Therapy: Patient Spontanous Breathing and Patient connected to face mask oxygen  Post-op Assessment: Report given to RN and Post -op Vital signs reviewed and stable  Post vital signs: Reviewed and stable  Last Vitals:  Vitals Value Taken Time  BP 140/80 09/03/22 1456  Temp    Pulse 68 09/03/22 1458  Resp 20 09/03/22 1458  SpO2 100 % 09/03/22 1458  Vitals shown include unvalidated device data.  Last Pain:  Vitals:   09/03/22 1145  TempSrc: Oral  PainSc:          Complications: No notable events documented.

## 2022-09-03 NOTE — Op Note (Signed)
09/03/2022  2:44 PM  PATIENT:  Emily Valentine  72 y.o. 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  This assistant was needed for exposure, dissection, suction, tissue approximation, retraction, perception, etc  ANESTHESIA:  General endotracheal intubation anesthesia (GETA) and Regional TRANSVERSUS ABDOMINIS PLANE (TAP) nerve block -BILATERAL for perioperative & postoperative pain control at the level of the transverse abdominis & preperitoneal spaces along the flank at the anterior axillary line, from subcostal ridge to iliac crest under laparoscopic guidance provided with liposomal bupivacaine (Experel) 20mL mixed with 50 mL of bupivicaine 0.25% with epinephrine  Estimated Blood Loss (EBL):   Total I/O In: 1400 [I.V.:1300; IV Piggyback:100] Out: 250 [Urine:200; Blood:50].   (See anesthesia record)  Delay start of Pharmacological VTE agent (>24hrs) due to concerns of significant anemia, surgical blood loss, or risk of bleeding?:  no  DRAINS: (None)  SPECIMEN:  Rectosigmoid (open end proximal) and Distal anastomotic ring (FINAL DISTAL MARGIN)  DISPOSITION OF SPECIMEN:  Pathology  COUNTS:  Sponge, needle, & instrument counts CORRECT  PLAN OF CARE: Admit to inpatient   PATIENT  DISPOSITION:  PACU - hemodynamically stable.  INDICATION:    Pleasant woman with bowel changes and chronic constipation.  Underwent endoscopy workup of sigmoid stricture rather tight.  Biopsies benign.  Most likely diverticular in nature.  I recommended segmental resection:  The anatomy & physiology of the digestive tract was discussed.  The pathophysiology was discussed.  Natural history risks without surgery was discussed.   I worked to give an overview of the disease and the frequent need to have multispecialty involvement.  I feel the risks of no intervention will lead to serious problems that outweigh the operative risks; therefore, I recommended a partial colectomy to remove the pathology.  Laparoscopic & open techniques were discussed.   Risks such as bleeding, infection, abscess, leak, reoperation, possible ostomy, hernia, heart attack, death, and other risks were discussed.  I noted a good likelihood this will help address the problem.   Goals of post-operative recovery were discussed as well.  We will work to minimize complications.  Educational materials on the pathology had been given in the office.  Questions were answered.    The patient expressed understanding & wished to proceed with surgery.  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  Patient had extensive endometriosis, 1-22mm atrophic, within the intestinal wall especially in the ileum.  Rather small and not active consistent with postmenopausal state from prior TVH/BSO.  Rather dense small bowel adhesions to the pelvis requiring small bowel repair x 3  No obvious metastatic disease on visceral parietal peritoneum or liver.  It is a 31mm EEA anastomosis ( distal descending colon  connected to proximal rectum.)  It rests 13 cm from the anal verge by rigid proctoscopy.  CASE  DATA:  Type of patient?: Elective [facility] Private Case  Status of Case?  Elective Scheduled  Infection Present At Time Of Surgery (PATOS)?  PHLEGMON  DESCRIPTION:   Informed consent was confirmed.  The patient underwent general anaesthesia without difficulty.  The patient was positioned appropriately.  VTE prevention in place.  The patient was clipped, prepped, & draped in a sterile fashion.  Surgical timeout confirmed our plan.  The patient was positioned in reverse Trendelenburg.  Abdominal entry was gained using Varess technique at the left subcostal ridge on the anterior abdominal wall.  No elevated EtCO2 noted.  Port placed.  Camera inspection revealed no injury.  Extra ports were carefully placed under direct laparoscopic visualization.  Upon entering the abdomen (organ space),we encountered a phlegmon involving the rectosigmoid colon with dense small bowel adhesions .   I reflected the greater omentum and the upper abdomen the small bowel in the upper abdomen.  The patient was carefully positioned.  The [employer]uitive daVinci robot was docked with camera & instruments carefully placed.  Had a focus on free numerous small bowel loops densely adherent to the pelvis.  Some more soft.  There were 3 loops densely adherent to the rectosigmoid colon was markedly inflamed.  Had to get into the small bowel x 2 to free them off.  I mobilized the rectosigmoid colon & elevated it to put the main pedicle on tension.  I scored the base of peritoneum of the medial side of the mesentery of the elevated left colon from the ligament of Treitz to the mid rectum.   I elevated the sigmoid mesentery and entered into the retro-mesenteric plane. We were able to identify the left ureter and gonadal vessels. We kept those posterior within the retroperitoneum and elevated the left colon mesentery off that. I did isolate the inferior mesenteric artery (IMA) pedicle but did not ligate it yet.  I continued distally and got into the avascular plane posterior to the mesorectum, sparing the nervi  ergentes.. This allowed me to help mobilize the rectum as well by freeing the mesorectum off the sacrum.  I stayed away from the right and left ureters.  I kept the lateral vascular pedicles to the rectum intact.  I mobilized the descending and sigmoid colon in a lateral medial fashion.  Eventually I was able to free the thickened phlegmonous rectosigmoid colon off the left pelvis at the site of where the left adnexa would have been.  Patient had prior hysterectomy and bilateral salpingo-oophorectomy.  I skeletonized the lymph nodes off the inferior mesenteric artery pedicle.  I went down to its takeoff from the aorta.  I isolated the inferior mesenteric vein off of the ligament of Treitz just cephalad to that as well.  After confirming the left ureter was out of the way, I went ahead and ligated the inferior mesenteric artery pedicle just near its takeoff from the aorta.  I did ligate the inferior mesenteric vein in a similar fashion.  We ensured hemostasis.  I continued medial to lateral dissection to free the left colon mesentery off the retroperitoneum going up towards the splenic flexure to allow good mobility and protect the colon mesentery.  I mobilized the left colon in a lateral to medial fashion off the retroperitoneum and sidewall attachments along the line of Toldt up towards the splenic flexure to ensure good mobilization of the remaining left colon to reach into the pelvis.   We then focused on mesorectal dissection.  Freed the mesorectum off the  presacral plane until I was distal to the concerning region.  Had to get down into the proximal rectum given the inflammation especially on the left lateral rectosigmoid region.  Freed off peritoneum on the lateral sidewalls as well and transected the mesentery of the lateral pedicles to get distal to the area of concern.  Came around anteriorly such that I had good circumferential mesorectal excision and a good margin distal to the area of concern.  I  chose a region at the descending/sigmoid junction that was soft and easily reached down to the rectal stump.  I skeletonized the mesorectum at the proximal rectum.  We then chose a region for the proximal margin that would reach well for our planned anastomosis (distal descending colon).  Transected the colon mesentery radially to preserve good collateral and marginal artery blood supply.  Then transected at the distal margin with a robotic stapler.  We created an extraction incision through a small Pfannenstiel incision in the suprapubic region.  Placed a wound protector.  I was able to eviscerate the rectosigmoid and descending colon out the wound.   I clamped the colon proximal to this area using a reusable pursestringer device.  Passed a 2-0 Keith needle. I transected at the descending/sigmoid junction with a scalpel. I got healthy bleeding mucosa.  We sent the rectosigmoid colon specimen off to go to pathology.  We sized the colon orifice.  I chose a 31mm EEA anvil stapler system.  I reinforced the prolene pursestring with interrupted silk "belt loop" sutures.  I placed the anvil to the open end of the proximal remaining colon and closed around it using the pursestring.      Eviscerated the small bowel located due to suspected obstruction and small enterotomies.  1 was 2 mm around 5 mm.  Closed this primarily with moderate and silk sutures transversely.  Found another thinned out area of the adhesive suture.  This is from inspecting small bowel patient had results although suspicion as well as some colorless nodule is 1-3 mm consistent with endometriosis.  None were causing any obstruction and were diffise & milliary.  Did not feel like we needed to resect she she was s/p TAH/BSO.  Dr. Maisie Fus agreed.  We did copious irrigation with crystalloid solution.  Hemostasis was good.  The distal end of the remaining colon easily reached down to the rectal stump, therefore, splenic flexure mobilization was not  needed.      Dr Maisie Fus scrubbed down and did gentle anal dilation and advanced the EEA stapler up the rectal stump. The spike was brought out at the provimal end of the rectal stump under direct visualization.  I  attached the anvil of the proximal colon the spike of the stapler. Anvil was tightened down and held clamped for 60 seconds.  Orientation was confirmed such that there is no twisting of the colon nor small bowel underneath the mesenteric defect. No concerning tension.  The EEA stapler was fired and held clamped for 30 seconds. The stapler was released & removed. Blue stitch is in the proximal ring.  Care was taken to ensure no other structures were incorporated within this either.  We noted 2 excellent anastomotic rings.   The colon proximal to the anastomosis was then gently occluded. The pelvis was filled with sterile irrigation.  Dr Maisie Fus  did rigid proctoscopy noted the anastomosis was at 13 cm from the anal verge consistent with the proximal rectum.  There was a negative air leak test. There  was no tension of mesentery or bowel at the anastomosis.   Tissues looked viable.  Ureters & bowel uninjured.  The anastomosis looked healthy. Greater omentum positioned down into the pelvis to help protect the anastomosis.  Endoluminal gas was evacuated.  Ports & wound protector removed.  We changed gloves & redraped the patient per colon SSI prevention protocol.  We aspirated the sterile irrigation.  Hemostasis was good.  Sterile unused instruments were used from this point.  I closed the skin at the port sites using Monocryl stitch and sterile dressing.  We assured hemostasis and the former ostomy wound.  Wound irrigated.  I closed the posterior rectus fascia with 0 Vicryl suture.  Anterior rectus fascia was closed using #1 PDS transversely.  Sterile dressing placed.   Patient is being extubated go to recovery room. I had discussed postop care with the patient in detail the office & in the holding  area. Instructions are written. I discussed operative findings, updated the patient's status, discussed probable steps to recovery, and gave postoperative recommendations to the patient's daughter, Roselyn .  Recommendations were made.  Questions were answered.  She expressed understanding & appreciation.  Ardeth Sportsman, M.D., F.A.C.S. Gastrointestinal and Minimally Invasive Surgery [facility] [address] [place], [place] [address] [phone] Main / Paging

## 2022-09-03 NOTE — Progress Notes (Signed)
Pacu RN Report to floor given  Gave report to  RN. Room:1321    Discussed surgery, meds given in OR and Pacu, VS, IV fluids given, EBL, urine output, pain and other pertinent information. Also discussed if pt had any family or friends here or belongings with them.   Pt has 5 lapsites w/  2x2/tegaderm, all CDI. She also has an incision to bikini line w/ Honeycomb dressing, slight staining of serousang output. No drains. Pt feels like she has to have a bowel movement but did not.   Pt exits my care.

## 2022-09-03 NOTE — Discharge Instructions (Signed)
SURGERY: POST OP INSTRUCTIONS (Surgery for small bowel obstruction, colon resection, etc)   ######################################################################  EAT Gradually transition to a high fiber diet with a fiber supplement over the next few days after discharge  WALK Walk an hour a day.  Control your pain to do that.    CONTROL PAIN Control pain so that you can walk, sleep, tolerate sneezing/coughing, go up/down stairs.  HAVE A BOWEL MOVEMENT DAILY Keep your bowels regular to avoid problems.  OK to try a laxative to override constipation.  OK to use an antidairrheal to slow down diarrhea.  Call if not better after 2 tries  CALL IF YOU HAVE PROBLEMS/CONCERNS Call if you are still struggling despite following these instructions. Call if you have concerns not answered by these instructions  ######################################################################   DIET Follow a light diet the first few days at home.  Start with a bland diet such as soups, liquids, starchy foods, low fat foods, etc.  If you feel full, bloated, or constipated, stay on a ful liquid or pureed/blenderized diet for a few days until you feel better and no longer constipated. Be sure to drink plenty of fluids every day to avoid getting dehydrated (feeling dizzy, not urinating, etc.). Gradually add a fiber supplement to your diet over the next week.  Gradually get back to a regular solid diet.  Avoid fast food or heavy meals the first week as you are more likely to get nauseated. It is expected for your digestive tract to need a few months to get back to normal.  It is common for your bowel movements and stools to be irregular.  You will have occasional bloating and cramping that should eventually fade away.  Until you are eating solid food normally, off all pain medications, and back to regular activities; your bowels will not be normal. Focus on eating a low-fat, high fiber diet the rest of your life  (See Getting to Good Bowel Health, below).  CARE of your INCISION or WOUND  It is good for closed incisions and even open wounds to be washed every day.  Shower every day.  Short baths are fine.  Wash the incisions and wounds clean with soap & water.    You may leave closed incisions open to air if it is dry.   You may cover the incision with clean gauze & replace it after your daily shower for comfort.  TEGADERM:  You have clear gauze band-aid dressings over your closed incision(s).  Remove the dressings 3 days after surgery.    If you have an open wound with a wound vac, see wound vac care instructions.    ACTIVITIES as tolerated Start light daily activities --- self-care, walking, climbing stairs-- beginning the day after surgery.  Gradually increase activities as tolerated.  Control your pain to be active.  Stop when you are tired.  Ideally, walk several times a day, eventually an hour a day.   Most people are back to most day-to-day activities in a few weeks.  It takes 4-8 weeks to get back to unrestricted, intense activity. If you can walk 30 minutes without difficulty, it is safe to try more intense activity such as jogging, treadmill, bicycling, low-impact aerobics, swimming, etc. Save the most intensive and strenuous activity for last (Usually 4-8 weeks after surgery) such as sit-ups, heavy lifting, contact sports, etc.  Refrain from any intense heavy lifting or straining until you are off narcotics for pain control.  You will have off days, but   things should improve week-by-week. DO NOT PUSH THROUGH PAIN.  Let pain be your guide: If it hurts to do something, don't do it.  Pain is your body warning you to avoid that activity for another week until the pain goes down. You may drive when you are no longer taking narcotic prescription pain medication, you can comfortably wear a seatbelt, and you can safely make sudden turns/stops to protect yourself without hesitating due to pain. You may  have sexual intercourse when it is comfortable. If it hurts to do something, stop.   MEDICATIONS Take your usually prescribed home medications unless otherwise directed.   Blood thinners:  You can restart any strong blood thinners after the second postoperative day.  It is OK to continue aspirin before & after surgery..    Some blood in BMs the first 1-2 weeks is common but should taper down & be small volume.  If you are passing many large clots, call your surgeon    PAIN CONTROL Pain after surgery or related to activity is often due to strain/injury to muscle, tendon, nerves and/or incisions.  This pain is usually short-term and will improve in a few months.  To help speed the process of healing and to get back to regular activity more quickly, DO THE FOLLOWING THINGS TOGETHER: Increase activity gradually.  DO NOT PUSH THROUGH PAIN Use Ice and/or Heat Try Gentle Massage and/or Stretching Take over the counter pain medication Take Narcotic prescription pain medication for more severe pain  Good pain control = faster recovery.  It is better to take more medicine to be more active than to stay in bed all day to avoid medications.  Increase activity gradually Avoid heavy lifting at first, then increase to lifting as tolerated over the next 6 weeks. Do not "push through" the pain.  Listen to your body and avoid positions and maneuvers than reproduce the pain.  Wait a few days before trying something more intense Walking an hour a day is encouraged to help your body recover faster and more safely.  Start slowly and stop when getting sore.  If you can walk 30 minutes without stopping or pain, you can try more intense activity (running, jogging, aerobics, cycling, swimming, treadmill, sex, sports, weightlifting, etc.) Remember: If it hurts to do it, then don't do it! Use Ice and/or Heat You will have swelling and bruising around the incisions.  This will take several weeks to resolve. Ice packs  or heating pads (6-8 times a day, 30-60 minutes at a time) will help sooth soreness & bruising. Some people prefer to use ice alone, heat alone, or alternate between ice & heat.  Experiment and see what works best for you.  Consider trying ice for the first few days to help decrease swelling and bruising; then, switch to heat to help relax sore spots and speed recovery. Shower every day.  Short baths are fine.  It feels good!  Keep the incisions and wounds clean with soap & water.   Try Gentle Massage and/or Stretching Massage at the area of pain many times a day Stop if you feel pain - do not overdo it Take over the counter pain medication This helps the muscle and nerve tissues become less irritable and calm down faster Choose ONE of the following over-the-counter anti-inflammatory medications: Acetaminophen 500mg tabs (Tylenol) 1-2 pills with every meal and just before bedtime (avoid if you have liver problems or if you have acetaminophen in you narcotic prescription) Naproxen 220mg tabs (  ex. Aleve, Naprosyn) 1-2 pills twice a day (avoid if you have kidney, stomach, IBD, or bleeding problems) Ibuprofen 200mg tabs (ex. Advil, Motrin) 3-4 pills with every meal and just before bedtime (avoid if you have kidney, stomach, IBD, or bleeding problems) Take with food/snack several times a day as directed for at least 2 weeks to help keep pain / soreness down & more manageable. Take Narcotic prescription pain medication for more severe pain A prescription for strong pain control is often given to you upon discharge (for example: oxycodone/Percocet, hydrocodone/Norco/Vicodin, or tramadol/Ultram) Take your pain medication as prescribed. Be mindful that most narcotic prescriptions contain Tylenol (acetaminophen) as well - avoid taking too much Tylenol. If you are having problems/concerns with the prescription medicine (does not control pain, nausea, vomiting, rash, itching, etc.), please call us [phone]  [phone] to see if we need to switch you to a different pain medicine that will work better for you and/or control your side effects better. If you need a refill on your pain medication, you must call the office before 4 pm and on weekdays only.  By federal law, prescriptions for narcotics cannot be called into a pharmacy.  They must be filled out on paper & picked up from our office by the patient or authorized caretaker.  Prescriptions cannot be filled after 4 pm nor on weekends.    WHEN TO CALL US [phone] Severe uncontrolled or worsening pain  Fever over 101 F (38.5 C) Concerns with the incision: Worsening pain, redness, rash/hives, swelling, bleeding, or drainage Reactions / problems with new medications (itching, rash, hives, nausea, etc.) Nausea and/or vomiting Difficulty urinating Difficulty breathing Worsening fatigue, dizziness, lightheadedness, blurred vision Other concerns If you are not getting better after two weeks or are noticing you are getting worse, contact our office [phone] for further advice.  We may need to adjust your medications, re-evaluate you in the office, send you to the emergency room, or see what other things we can do to help. The clinic staff is available to answer your questions during regular business hours (8:30am-5pm).  Please don't hesitate to call and ask to speak to one of our nurses for clinical concerns.    A surgeon from [facility] is always on call at the hospitals 24 hours/day If you have a medical emergency, go to the nearest emergency room or call 911.  FOLLOW UP in our office One the day of your discharge from the hospital (or the next business weekday), please call [facility] to set up or confirm an appointment to see your surgeon in the office for a follow-up appointment.  Usually it is 2-3 weeks after your surgery.   If you have skin staples at your incision(s), let the office know so we can set up a time  in the office for the nurse to remove them (usually around 10 days after surgery). Make sure that you call for appointments the day of discharge (or the next business weekday) from the hospital to ensure a convenient appointment time. IF YOU HAVE DISABILITY OR FAMILY LEAVE FORMS, BRING THEM TO THE OFFICE FOR PROCESSING.  DO NOT GIVE THEM TO YOUR DOCTOR.  [facility] [address], [address], [place], [place]  [address] ? [phone] - Main [phone] - Toll Free,  [phone] - Fax [email or web address]    GETTING TO GOOD BOWEL HEALTH. It is expected for your digestive tract to need a few months to get back to   normal.  It is common for your bowel movements and stools to be irregular.  You will have occasional bloating and cramping that should eventually fade away.  Until you are eating solid food normally, off all pain medications, and back to regular activities; your bowels will not be normal.   Avoiding constipation The goal: ONE SOFT BOWEL MOVEMENT A DAY!    Drink plenty of fluids.  Choose water first. TAKE A FIBER SUPPLEMENT EVERY DAY THE REST OF YOUR LIFE During your first week back home, gradually add back a fiber supplement every day Experiment which form you can tolerate.   There are many forms such as powders, tablets, wafers, gummies, etc Psyllium bran (Metamucil), methylcellulose (Citrucel), Miralax or Glycolax, Benefiber, Flax Seed.  Adjust the dose week-by-week (1/2 dose/day to 6 doses a day) until you are moving your bowels 1-2 times a day.  Cut back the dose or try a different fiber product if it is giving you problems such as diarrhea or bloating. Sometimes a laxative is needed to help jump-start bowels if constipated until the fiber supplement can help regulate your bowels.  If you are tolerating eating & you are farting, it is okay to try a gentle laxative such as double dose MiraLax, prune juice, or Milk of Magnesia.  Avoid using  laxatives too often. Stool softeners can sometimes help counteract the constipating effects of narcotic pain medicines.  It can also cause diarrhea, so avoid using for too long. If you are still constipated despite taking fiber daily, eating solids, and a few doses of laxatives, call our office. Controlling diarrhea Try drinking liquids and eating bland foods for a few days to avoid stressing your intestines further. Avoid dairy products (especially milk & ice cream) for a short time.  The intestines often can lose the ability to digest lactose when stressed. Avoid foods that cause gassiness or bloating.  Typical foods include beans and other legumes, cabbage, broccoli, and dairy foods.  Avoid greasy, spicy, fast foods.  Every person has some sensitivity to other foods, so listen to your body and avoid those foods that trigger problems for you. Probiotics (such as active yogurt, Align, etc) may help repopulate the intestines and colon with normal bacteria and calm down a sensitive digestive tract Adding a fiber supplement gradually can help thicken stools by absorbing excess fluid and retrain the intestines to act more normally.  Slowly increase the dose over a few weeks.  Too much fiber too soon can backfire and cause cramping & bloating. It is okay to try and slow down diarrhea with a few doses of antidiarrheal medicines.   Bismuth subsalicylate (ex. Kayopectate, Pepto Bismol) for a few doses can help control diarrhea.  Avoid if pregnant.   Loperamide (Imodium) can slow down diarrhea.  Start with one tablet (2mg) first.  Avoid if you are having fevers or severe pain.  ILEOSTOMY PATIENTS WILL HAVE CHRONIC DIARRHEA since their colon is not in use.    Drink plenty of liquids.  You will need to drink even more glasses of water/liquid a day to avoid getting dehydrated. Record output from your ileostomy.  Expect to empty the bag every 3-4 hours at first.  Most people with a permanent ileostomy empty their  bag 4-6 times at the least.   Use antidiarrheal medicine (especially Imodium) several times a day to avoid getting dehydrated.  Start with a dose at bedtime & breakfast.  Adjust up or down as needed.  Increase antidiarrheal medications as   directed to avoid emptying the bag more than 8 times a day (every 3 hours). Work with your wound ostomy nurse to learn care for your ostomy.  See ostomy care instructions. TROUBLESHOOTING IRREGULAR BOWELS 1) Start with a soft & bland diet. No spicy, greasy, or fried foods.  2) Avoid gluten/wheat or dairy products from diet to see if symptoms improve. 3) Miralax 17gm or flax seed mixed in 8oz. water or juice-daily. May use 2-4 times a day as needed. 4) Gas-X, Phazyme, etc. as needed for gas & bloating.  5) Prilosec (omeprazole) over-the-counter as needed 6)  Consider probiotics (Align, Activa, etc) to help calm the bowels down  Call your doctor if you are getting worse or not getting better.  Sometimes further testing (cultures, endoscopy, X-ray studies, CT scans, bloodwork, etc.) may be needed to help diagnose and treat the cause of the diarrhea. [facility] [address], [address], [place], [place]  [address] [phone] - Main.    [phone]  - Toll Free.   [phone] - Fax [email or web address]  

## 2022-09-04 ENCOUNTER — Encounter (HOSPITAL_COMMUNITY): Payer: Self-pay | Admitting: Surgery

## 2022-09-04 DIAGNOSIS — N183 Chronic kidney disease, stage 3 unspecified: Secondary | ICD-10-CM | POA: Insufficient documentation

## 2022-09-04 LAB — CBC
HCT: 35.3 % — ABNORMAL LOW (ref 36.0–46.0)
Hemoglobin: 11.8 g/dL — ABNORMAL LOW (ref 12.0–15.0)
MCH: 32.7 pg (ref 26.0–34.0)
MCHC: 33.4 g/dL (ref 30.0–36.0)
MCV: 97.8 fL (ref 80.0–100.0)
Platelets: 242 10*3/uL (ref 150–400)
RBC: 3.61 MIL/uL — ABNORMAL LOW (ref 3.87–5.11)
RDW: 14.3 % (ref 11.5–15.5)
WBC: 12.7 10*3/uL — ABNORMAL HIGH (ref 4.0–10.5)
nRBC: 0 % (ref 0.0–0.2)

## 2022-09-04 LAB — BASIC METABOLIC PANEL
Anion gap: 8 (ref 5–15)
BUN: 16 mg/dL (ref 8–23)
CO2: 27 mmol/L (ref 22–32)
Calcium: 8.5 mg/dL — ABNORMAL LOW (ref 8.9–10.3)
Chloride: 101 mmol/L (ref 98–111)
Creatinine, Ser: 1.49 mg/dL — ABNORMAL HIGH (ref 0.44–1.00)
GFR, Estimated: 37 mL/min — ABNORMAL LOW (ref >60)
Glucose, Bld: 125 mg/dL — ABNORMAL HIGH (ref 70–99)
Potassium: 3.9 mmol/L (ref 3.5–5.1)
Sodium: 136 mmol/L (ref 135–145)

## 2022-09-04 LAB — MAGNESIUM: Magnesium: 1.8 mg/dL (ref 1.7–2.4)

## 2022-09-04 IMAGING — MG MM DIGITAL SCREENING BILAT W/ TOMO AND CAD
8 series · 9 of 24 positions shown · non-contrast
Comparison: Previous exam(s).

CLINICAL DATA: Screening.

EXAM:
DIGITAL SCREENING BILATERAL MAMMOGRAM WITH TOMOSYNTHESIS AND CAD
TECHNIQUE: Bilateral screening digital craniocaudal and mediolateral oblique
mammograms were obtained. Bilateral screening digital breast
tomosynthesis was performed. The images were evaluated with
computer-aided detection.

[R MLO synth-2D]
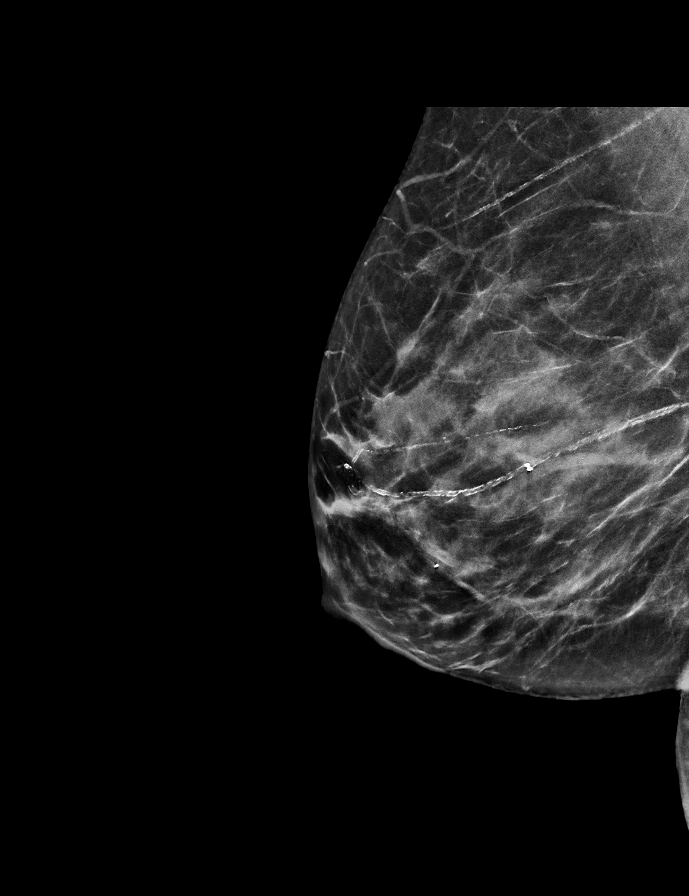

[L MLO synth-2D]
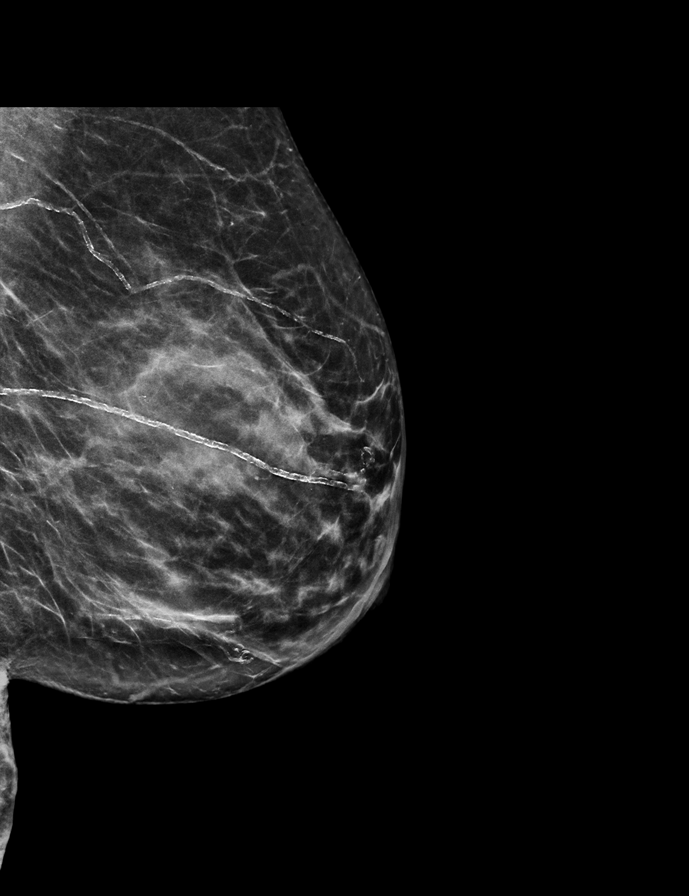

[L CC synth-2D]
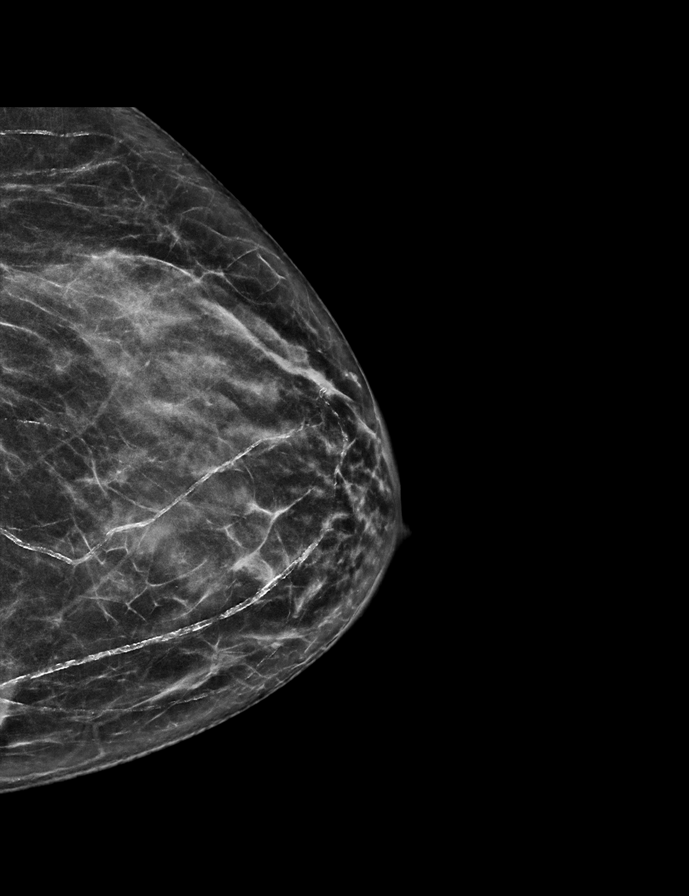

[R CC synth-2D]
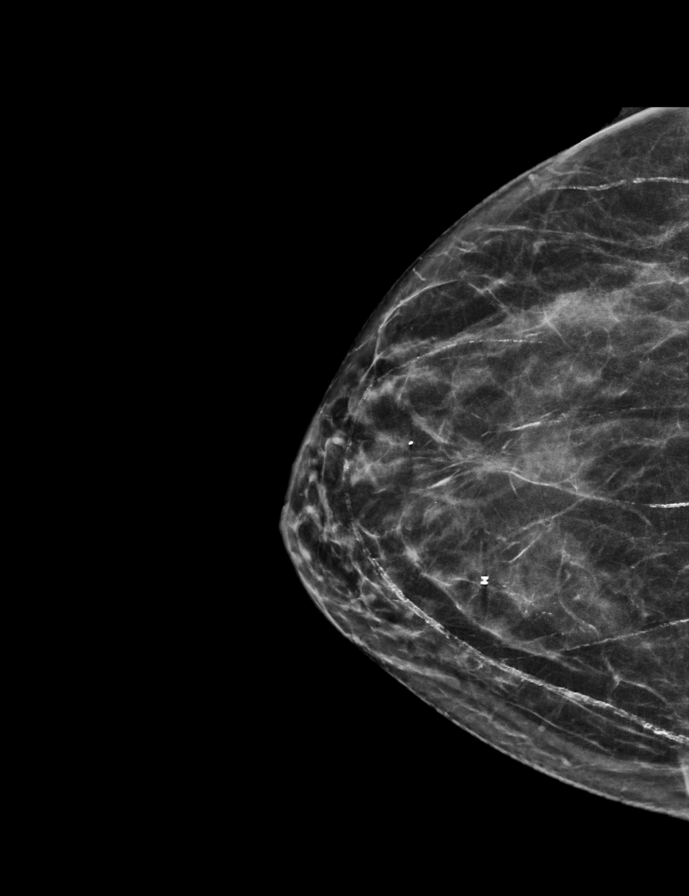

[L MLO tomo · 2 of 60 frames shown]
[frame 20/60]
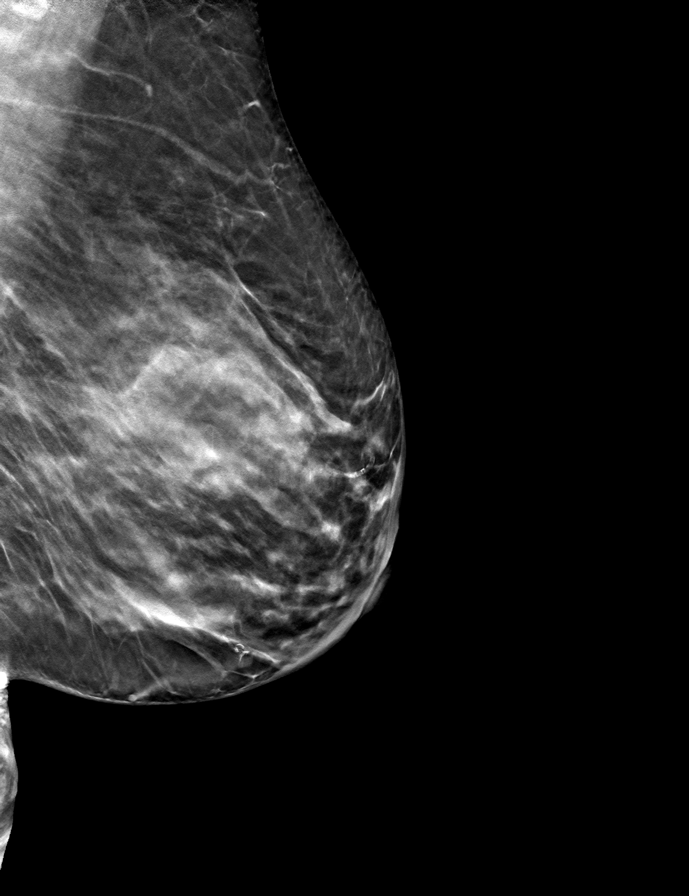
[frame 31/60]
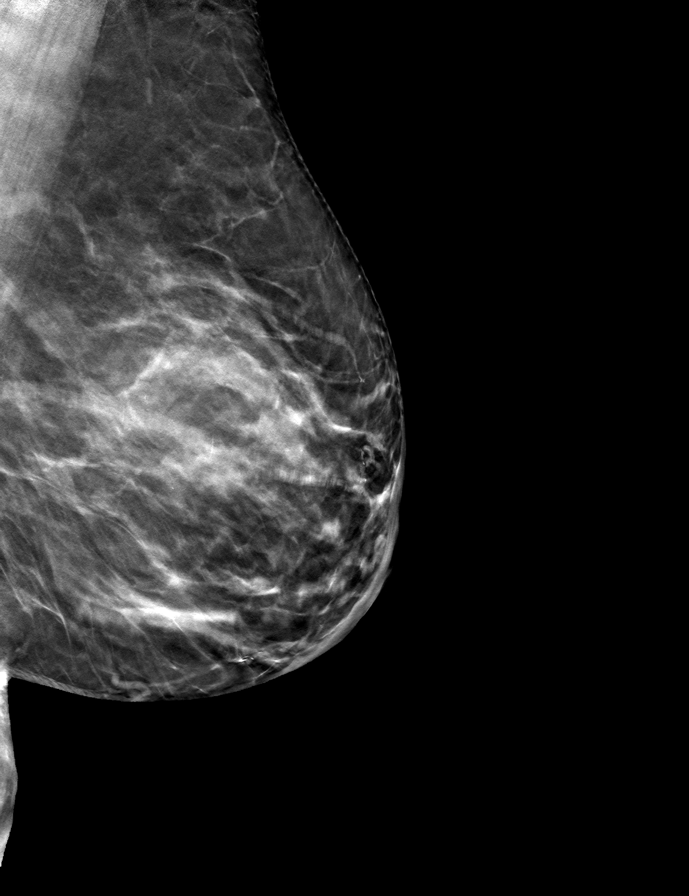

[R MLO tomo · tomo slice 31/60.0]
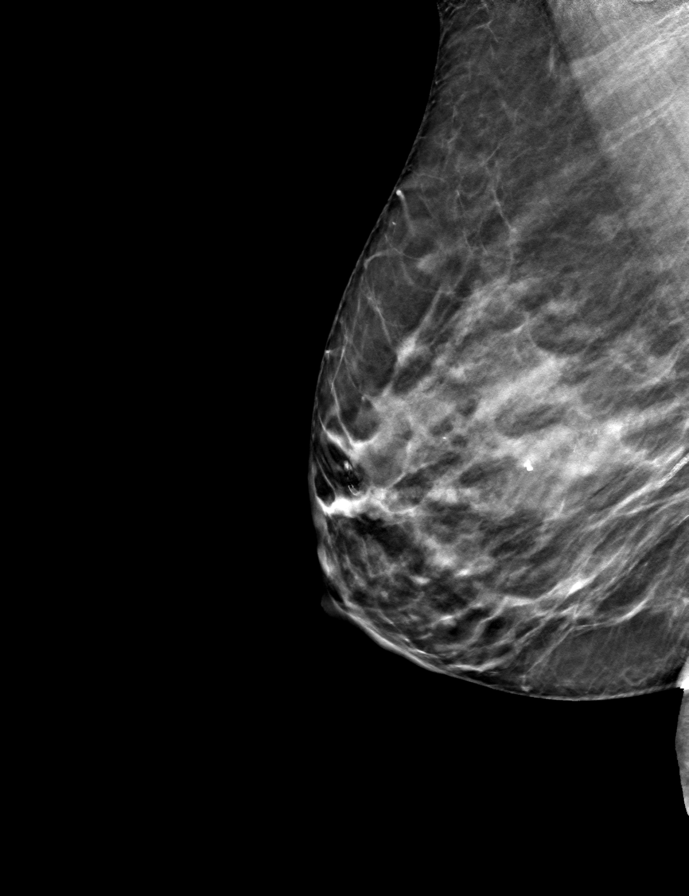

[L CC tomo · tomo slice 30/59.0]
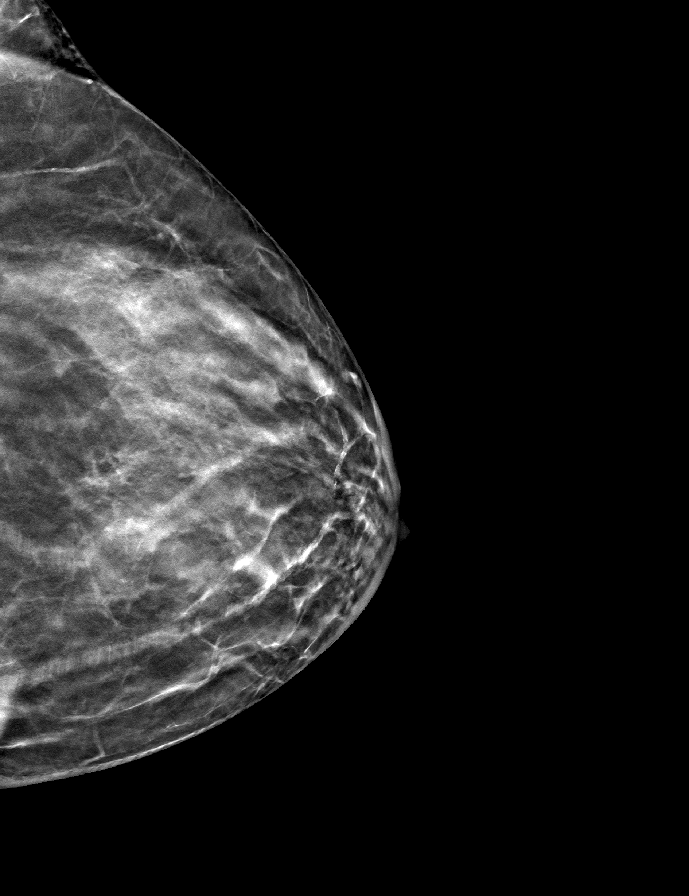

[R CC tomo · tomo slice 29/56.0]
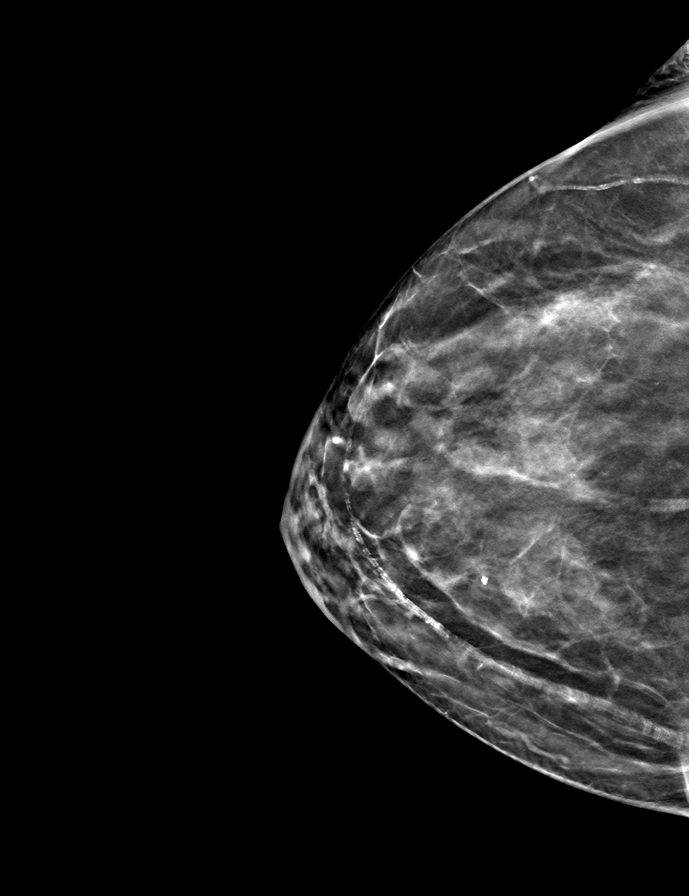

[9 of 24 positions shown; findings below may reference images not displayed]

ACR Breast Density Category c: The breast tissue is heterogeneously
dense, which may obscure small masses.
FINDINGS: There are no findings suspicious for malignancy.
IMPRESSION: No mammographic evidence of malignancy. A result letter of this
screening mammogram will be mailed directly to the patient.

RECOMMENDATION:
Screening mammogram in one year. (Code:[id number]-W-BC3)

BI-RADS CATEGORY  1: Negative.

## 2022-09-04 MED ORDER — PANTOPRAZOLE SODIUM 40 MG PO TBEC
40.0000 mg | DELAYED_RELEASE_TABLET | Freq: Every day | ORAL | Status: DC
  Administered 2022-09-04 – 2022-09-06 (×3): 40 mg via ORAL
  Filled 2022-09-04 (×3): qty 1

## 2022-09-04 MED ORDER — BISMUTH SUBSALICYLATE 262 MG/15ML PO SUSP: 30.0000 mL | Freq: Three times a day (TID) | ORAL | Status: DC | PRN

## 2022-09-04 NOTE — Progress Notes (Addendum)
09/04/2022  Emily Valentine [id number] 1951-03-11  CARE TEAM: PCP: Assunta Found, MD  Outpatient Care Team: Patient Care Team: Assunta Found, MD as PCP - General (Family Medicine) Wendall Stade, MD as PCP - Cardiology (Cardiology) Jena Gauss Gerrit Friends, MD (Gastroenterology) Lurena Nida, MD as Consulting Physician (Rheumatology) Karie Soda, MD as Consulting Physician (Colon and Rectal Surgery) Emelia Loron, MD as Consulting Physician (General Surgery)  Inpatient Treatment Team: Treatment Team: Attending Provider: Karie Soda, MD; WOC Nurse: Leanna Battles, RN; Charge Nurse: Idelia Salm, RN; Technician: Rose Phi, NT; Pharmacist: Armandina Stammer, [facility]; Technician: Christell Constant, NT; Pharmacist: Hessie Knows, [facility]; Charge Nurse: Saddie Benders, RN   Problem List:   Principal Problem:   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 Active Problems:   Right bundle branch block   Hypertension   Frequent PVCs   Hypothyroidism   History of diverticulitis of colon   Laryngopharyngeal reflux (LPR)   Endometriosis of small intestine   Rheumatoid arthritis (HCC)   Pernicious anemia   Asthma   CKD (chronic kidney disease) stage 3, GFR 30-59 ml/min (HCC)   09/03/2022  POST-OPERATIVE DIAGNOSIS:   STRICTURE OF SIGMOID COLON ENDOMETRIOSIS OF SMALL INTESTINE    PROCEDURE:   LOW ANTERIOR RECTOSIGMOID RESECTION SMALL INTESTINE REPAIR X 3 TRANSVERSUS ABDOMINIS PLANE (TAP) BLOCK - BILATERAL   SURGEON:  Ardeth Sportsman, MD   OR FINDINGS:   Patient had thickened segment of rectosigmoid junction most likely due to chronic diverticular stricture. No obvious metastatic disease on visceral parietal peritoneum or liver.   Patient had extensive endometriosis, 1-4mm atrophic, within the intestinal wall especially in the ileum.  Rather small and not active consistent with postmenopausal state from prior TVH/BSO.  Rather dense small bowel adhesions to the  pelvis requiring small bowel repair x 3    It is a 31mm EEA anastomosis ( distal descending colon  connected to proximal rectum.)  It rests 13 cm from the anal verge by rigid proctoscopy.   CASE DATA: Type of patient?: Elective [facility] Private Case Status of Case? Elective Scheduled Infection Present At Time Of Surgery (PATOS)?  PHLEGMON     Assessment [facility] Stay = 1 days) 1 Day Post-Op    Recovering relatively well    Plan:  ERAS protocol Gradually advance diet Follow-up on pathology.  Most likely diverticular stricture with phlegmon expected. Postobstructive diarrhea not surprising.  Fiber supplement.  Adjust as needed. Hypertension control.  To lower dose and as needed History of GERD.  PPI Hypothyroidism.  Continue levothyroxine.  Mildly elevated creatinine but that is only a little bit above her baseline.  Suspect chronic kidney disease.  Nonoliguric.  Keep on the dry side and follow. -VTE prophylaxis- SCDs, etc -mobilize as tolerated to help recovery -Disposition:  Disposition:  The patient is from: Home Anticipate discharge to:  Home Anticipated Date of Discharge is:  June 24,2024 Barriers to discharge:  Pending Clinical improvement (more likely than not) Patient currently is NOT MEDICALLY STABLE for discharge from the hospital from a surgery standpoint.    I reviewed nursing notes, last 24 h vitals and pain scores, last 48 h intake and output, last 24 h labs and trends, and last 24 h imaging results.  I have reviewed this patient's available data, including medical history, events of note, test results, etc as part of my evaluation.   A significant portion of that time was spent in counseling. Care during the described time interval was provided  by me.  This care required moderate level of medical decision making.  09/04/2022    Subjective: (Chief complaint)  Sleepy last night but getting better  Tolerated clear liquids. Numerous loose bowel  movements. Daughter in room Walked in hallways x1  Objective:  Vital signs:  Vitals:   09/03/22 1737 09/03/22 1826 09/03/22 2041 09/04/22 0425  BP: 135/87 (!) 140/87 (!) 152/91 117/82  Pulse: 69 67 72 87  Resp: 16 14 14 18   Temp: 98.2 F (36.8 C) 97.8 F (36.6 C)  99.4 F (37.4 C)  TempSrc: Oral Oral  Oral  SpO2: 100% 100% 97% 100%  Weight:      Height:        Last BM Date : 09/03/22  Intake/Output   Yesterday:  06/20 0701 - 06/21 0700 In: 2339.2 [P.O.:180; I.V.:1959.2; IV Piggyback:200] Out: 2050 [Urine:2000; Blood:50] This shift:  No intake/output data recorded.  Bowel function:  Flatus: YES  BM:  YES  Drain: (No drain)   Physical Exam:  General: Pt awake/alert in no acute distress Eyes: PERRL, normal EOM.  Sclera clear.  No icterus Neuro: CN II-XII intact w/o focal sensory/motor deficits. Lymph: No head/neck/groin lymphadenopathy Psych:  No delerium/psychosis/paranoia.  Oriented x 4 HENT: Normocephalic, Mucus membranes moist.  No thrush Neck: Supple, No tracheal deviation.  No obvious thyromegaly Chest: No pain to chest wall compression.  Good respiratory excursion.  No audible wheezing CV:  Pulses intact.  Regular rhythm.  No major extremity edema MS: Normal AROM mjr joints.  No obvious deformity  Abdomen: Soft.  Nondistended.  Mildly tender at incisions only.  No evidence of peritonitis.  No incarcerated hernias.  Ext:   No deformity.  No mjr edema.  No cyanosis Skin: No petechiae / purpurea.  No major sores.  Warm and dry    Results:   Cultures: No results found for this or any previous visit (from the past 720 hour(s)).  Labs: Results for orders placed or performed during the hospital encounter of 09/03/22 (from the past 48 hour(s))  Type and screen [facility]     Status: None (Preliminary result)   Collection Time: 09/03/22 11:25 AM  Result Value Ref Range   ABO/RH(D) O POS    Antibody Screen POS    Sample Expiration  09/06/2022,2359    Unit Number [id number]    Blood Component Type RED CELLS,LR    Unit division 00    Status of Unit ALLOCATED    Donor AG Type NEGATIVE FOR KELL ANTIGEN    Transfusion Status OK TO TRANSFUSE    Crossmatch Result COMPATIBLE    Unit Number [id number]    Blood Component Type RED CELLS,LR    Unit division 00    Status of Unit ALLOCATED    Donor AG Type NEGATIVE FOR KELL ANTIGEN    Transfusion Status OK TO TRANSFUSE    Crossmatch Result COMPATIBLE   Basic metabolic panel     Status: Abnormal   Collection Time: 09/04/22  4:47 AM  Result Value Ref Range   Sodium 136 135 - 145 mmol/L   Potassium 3.9 3.5 - 5.1 mmol/L   Chloride 101 98 - 111 mmol/L   CO2 27 22 - 32 mmol/L   Glucose, Bld 125 (H) 70 - 99 mg/dL    Comment: Glucose reference range applies only to samples taken after fasting for at least 8 hours.   BUN 16 8 - 23 mg/dL   Creatinine, Ser 3.08 (H) 0.44 - 1.00  mg/dL   Calcium 8.5 (L) 8.9 - 10.3 mg/dL   GFR, Estimated 37 (L) >60 mL/min    Comment: (NOTE) Calculated using the CKD-EPI Creatinine Equation (2021)    Anion gap 8 5 - 15    Comment: Performed at [facility], [address] [place], [place] [address]  CBC     Status: Abnormal   Collection Time: 09/04/22  4:47 AM  Result Value Ref Range   WBC 12.7 (H) 4.0 - 10.5 K/uL   RBC 3.61 (L) 3.87 - 5.11 MIL/uL   Hemoglobin 11.8 (L) 12.0 - 15.0 g/dL   HCT 16.0 (L) 10.9 - 32.3 %   MCV 97.8 80.0 - 100.0 fL   MCH 32.7 26.0 - 34.0 pg   MCHC 33.4 30.0 - 36.0 g/dL   RDW 55.7 32.2 - 02.5 %   Platelets 242 150 - 400 K/uL   nRBC 0.0 0.0 - 0.2 %    Comment: Performed at [facility], [address] [place], [place] [address]  Magnesium     Status: None   Collection Time: 09/04/22  4:47 AM  Result Value Ref Range   Magnesium 1.8 1.7 - 2.4 mg/dL    Comment: Performed at [facility], [address], [place], [place] [address]    Imaging /  Studies: No results found.  Medications / Allergies: per chart  Antibiotics: Anti-infectives (From admission, onward)    Start     Dose/Rate Route Frequency Ordered Stop   09/03/22 2200  cefoTEtan (CEFOTAN) 2 g in sodium chloride 0.9 % 100 mL IVPB        2 g 200 mL/hr over 30 Minutes Intravenous Every 12 hours 09/03/22 1641 09/03/22 2109   09/03/22 1400  neomycin (MYCIFRADIN) tablet 1,000 mg  Status:  Discontinued       See Hyperspace for full Linked Orders Report.   1,000 mg Oral 3 times per day 09/03/22 1038 09/03/22 1047   09/03/22 1400  metroNIDAZOLE (FLAGYL) tablet 1,000 mg  Status:  Discontinued       See Hyperspace for full Linked Orders Report.   1,000 mg Oral 3 times per day 09/03/22 1038 09/03/22 1047   09/03/22 1045  cefoTEtan (CEFOTAN) 2 g in sodium chloride 0.9 % 100 mL IVPB        2 g 200 mL/hr over 30 Minutes Intravenous On call to O.R. 09/03/22 1038 09/03/22 1303         Note: Portions of this report may have been transcribed using voice recognition software. Every effort was made to ensure accuracy; however, inadvertent computerized transcription errors may be present.   Any transcriptional errors that result from this process are unintentional.    Ardeth Sportsman, MD, FACS, MASCRS Esophageal, Gastrointestinal & Colorectal Surgery Robotic and Minimally Invasive Surgery  [facility] [address] [place], [place] [address] [phone] Fax [phone] Main  CONTACT INFORMATION:  Weekday (9AM-5PM): Call [facility] main office at [phone]  Weeknight (5PM-9AM) or Weekend/Holiday: Check [email or web address] (password " [email or web address]") for General Surgery [facility] coverage  (Please, do not use SecureChat as it is not reliable communication to reach operating surgeons for immediate patient care given surgeries/outpatient duties/clinic/cross-coverage/off post-call which would lead to a delay in care.  Epic staff  messaging available for outptient concerns, but may not be answered for 48 hours or more).     09/04/2022  7:24 AM

## 2022-09-04 NOTE — Anesthesia Postprocedure Evaluation (Signed)
Anesthesia Post Note  Patient: Emily Valentine  Procedure(s) Performed: ROBOTIC LOW ANTERIOR RECTOSIGMOID RESECTION, SMALL INTESTINE REPAIR X3, TRANSVERSUS ABDOMINIS  PLANE (TAP) BLOCK - BILATERAL RIGID PROCTOSCOPY     Patient location during evaluation: PACU Anesthesia Type: General Level of consciousness: sedated and patient cooperative Pain management: pain level controlled Vital Signs Assessment: post-procedure vital signs reviewed and stable Respiratory status: spontaneous breathing Cardiovascular status: stable Anesthetic complications: no   No notable events documented.  Last Vitals:  Vitals:   09/03/22 2041 09/04/22 0425  BP: (!) 152/91 117/82  Pulse: 72 87  Resp: 14 18  Temp:  37.4 C  SpO2: 97% 100%    Last Pain:  Vitals:   09/04/22 0425  TempSrc: Oral  PainSc:                  Lewie Loron

## 2022-09-04 NOTE — Consult Note (Signed)
WOC Nurse ostomy follow up Pt went to the OR yesterday and did not receive an ostomy.  Surgical team following for assessment and plan of care. No further role for WOC team.  Please re-consult if further assistance is needed.  Thank-you,  Cammie Mcgee MSN, RN, CWOCN, [place], CNS [phone]

## 2022-09-05 LAB — CREATININE, SERUM
Creatinine, Ser: 1.54 mg/dL — ABNORMAL HIGH (ref 0.44–1.00)
GFR, Estimated: 36 mL/min — ABNORMAL LOW (ref >60)

## 2022-09-05 LAB — CBC
HCT: 28.5 % — ABNORMAL LOW (ref 36.0–46.0)
Hemoglobin: 9.9 g/dL — ABNORMAL LOW (ref 12.0–15.0)
MCH: 33.2 pg (ref 26.0–34.0)
MCHC: 34.7 g/dL (ref 30.0–36.0)
MCV: 95.6 fL (ref 80.0–100.0)
Platelets: 200 10*3/uL (ref 150–400)
RBC: 2.98 MIL/uL — ABNORMAL LOW (ref 3.87–5.11)
RDW: 14.5 % (ref 11.5–15.5)
WBC: 13.2 10*3/uL — ABNORMAL HIGH (ref 4.0–10.5)
nRBC: 0 % (ref 0.0–0.2)

## 2022-09-05 LAB — POTASSIUM: Potassium: 3.3 mmol/L — ABNORMAL LOW (ref 3.5–5.1)

## 2022-09-05 LAB — GLUCOSE, CAPILLARY: Glucose-Capillary: 124 mg/dL — ABNORMAL HIGH (ref 70–99)

## 2022-09-05 NOTE — Progress Notes (Signed)
2 Days Post-Op   Subjective/Chief Complaint: Complains of swelling right arm at IV site. She reports flatus   Objective: Vital signs in last 24 hours: Temp:  [97.7 F (36.5 C)-98.2 F (36.8 C)] 97.8 F (36.6 C) (06/22 0653) Pulse Rate:  [85-97] 85 (06/22 0653) Resp:  [14-18] 18 (06/22 0653) BP: (87-148)/(58-87) 93/61 (06/22 0653) SpO2:  [97 %-100 %] 100 % (06/22 0653) Weight:  [69.5 kg] 69.5 kg (06/22 0500) Last BM Date : 09/03/22  Intake/Output from previous day: 06/21 0701 - 06/22 0700 In: 720 [P.O.:720] Out: 700 [Urine:700] Intake/Output this shift: No intake/output data recorded.  General appearance: alert and cooperative Resp: clear to auscultation bilaterally Cardio: regular rate and rhythm GI: soft, mild tenderness. Incisions ok  Lab Results:  Recent Labs    09/04/22 0447 09/05/22 0528  WBC 12.7* 13.2*  HGB 11.8* 9.9*  HCT 35.3* 28.5*  PLT 242 200   BMET Recent Labs    09/04/22 0447 09/05/22 0528  NA 136  --   K 3.9 3.3*  CL 101  --   CO2 27  --   GLUCOSE 125*  --   BUN 16  --   CREATININE 1.49* 1.54*  CALCIUM 8.5*  --    PT/INR No results for input(s): "LABPROT", "INR" in the last 72 hours. ABG No results for input(s): "PHART", "HCO3" in the last 72 hours.  Invalid input(s): "PCO2", "PO2"  Studies/Results: No results found.  Anti-infectives: Anti-infectives (From admission, onward)    Start     Dose/Rate Route Frequency Ordered Stop   09/03/22 2200  cefoTEtan (CEFOTAN) 2 g in sodium chloride 0.9 % 100 mL IVPB        2 g 200 mL/hr over 30 Minutes Intravenous Every 12 hours 09/03/22 1641 09/03/22 2109   09/03/22 1400  neomycin (MYCIFRADIN) tablet 1,000 mg  Status:  Discontinued       See Hyperspace for full Linked Orders Report.   1,000 mg Oral 3 times per day 09/03/22 1038 09/03/22 1047   09/03/22 1400  metroNIDAZOLE (FLAGYL) tablet 1,000 mg  Status:  Discontinued       See Hyperspace for full Linked Orders Report.   1,000 mg Oral 3  times per day 09/03/22 1038 09/03/22 1047   09/03/22 1045  cefoTEtan (CEFOTAN) 2 g in sodium chloride 0.9 % 100 mL IVPB        2 g 200 mL/hr over 30 Minutes Intravenous On call to O.R. 09/03/22 1038 09/03/22 1303       Assessment/Plan: s/p Procedure(s) with comments: ROBOTIC LOW ANTERIOR RECTOSIGMOID RESECTION, SMALL INTESTINE REPAIR X3, TRANSVERSUS ABDOMINIS  PLANE (TAP) BLOCK - BILATERAL (N/A) - GEN w/ERAS PATHWAY RIGID PROCTOSCOPY (N/A) Advance diet to soft foods Ambulate Continue entereg POD 2  LOS: 2 days    Chevis Pretty III 09/05/2022

## 2022-09-06 LAB — CREATININE, SERUM
Creatinine, Ser: 1.37 mg/dL — ABNORMAL HIGH (ref 0.44–1.00)
GFR, Estimated: 41 mL/min — ABNORMAL LOW (ref >60)

## 2022-09-06 MED ORDER — FLUCONAZOLE 100 MG PO TABS
200.0000 mg | ORAL_TABLET | Freq: Every day | ORAL | Status: DC
  Administered 2022-09-06: 200 mg via ORAL
  Filled 2022-09-06: qty 2

## 2022-09-06 NOTE — Progress Notes (Signed)
3 Days Post-Op   Subjective/Chief Complaint: Complains of pain all over. Did have some loose bm's overnight   Objective: Vital signs in last 24 hours: Temp:  [98 F (36.7 C)-98.1 F (36.7 C)] 98.1 F (36.7 C) (06/23 0548) Pulse Rate:  [87-94] 94 (06/23 0548) Resp:  [16-18] 18 (06/23 0548) BP: (109-153)/(70-94) 153/89 (06/23 0548) SpO2:  [94 %-100 %] 94 % (06/23 0548) Last BM Date : 09/06/22  Intake/Output from previous day: 06/22 0701 - 06/23 0700 In: 1080 [P.O.:1080] Out: 750 [Urine:750] Intake/Output this shift: No intake/output data recorded.  General appearance: alert and cooperative Resp: clear to auscultation bilaterally Cardio: regular rate and rhythm GI: soft, mild tenderness. Good bs. Incisions look good  Lab Results:  Recent Labs    09/04/22 0447 09/05/22 0528  WBC 12.7* 13.2*  HGB 11.8* 9.9*  HCT 35.3* 28.5*  PLT 242 200   BMET Recent Labs    09/04/22 0447 09/05/22 0528 09/06/22 0445  NA 136  --   --   K 3.9 3.3*  --   CL 101  --   --   CO2 27  --   --   GLUCOSE 125*  --   --   BUN 16  --   --   CREATININE 1.49* 1.54* 1.37*  CALCIUM 8.5*  --   --    PT/INR No results for input(s): "LABPROT", "INR" in the last 72 hours. ABG No results for input(s): "PHART", "HCO3" in the last 72 hours.  Invalid input(s): "PCO2", "PO2"  Studies/Results: No results found.  Anti-infectives: Anti-infectives (From admission, onward)    Start     Dose/Rate Route Frequency Ordered Stop   09/03/22 2200  cefoTEtan (CEFOTAN) 2 g in sodium chloride 0.9 % 100 mL IVPB        2 g 200 mL/hr over 30 Minutes Intravenous Every 12 hours 09/03/22 1641 09/03/22 2109   09/03/22 1400  neomycin (MYCIFRADIN) tablet 1,000 mg  Status:  Discontinued       See Hyperspace for full Linked Orders Report.   1,000 mg Oral 3 times per day 09/03/22 1038 09/03/22 1047   09/03/22 1400  metroNIDAZOLE (FLAGYL) tablet 1,000 mg  Status:  Discontinued       See Hyperspace for full Linked  Orders Report.   1,000 mg Oral 3 times per day 09/03/22 1038 09/03/22 1047   09/03/22 1045  cefoTEtan (CEFOTAN) 2 g in sodium chloride 0.9 % 100 mL IVPB        2 g 200 mL/hr over 30 Minutes Intravenous On call to O.R. 09/03/22 1038 09/03/22 1303       Assessment/Plan: s/p Procedure(s) with comments: ROBOTIC LOW ANTERIOR RECTOSIGMOID RESECTION, SMALL INTESTINE REPAIR X3, TRANSVERSUS ABDOMINIS  PLANE (TAP) BLOCK - BILATERAL (N/A) - GEN w/ERAS PATHWAY RIGID PROCTOSCOPY (N/A) Advance diet POD 3 Ambulate Continue entereg Hopefully may be ready for d/c in another day or two  LOS: 3 days    Emily Valentine 09/06/2022

## 2022-09-07 ENCOUNTER — Other Ambulatory Visit (HOSPITAL_COMMUNITY): Payer: Self-pay

## 2022-09-07 LAB — TYPE AND SCREEN
Donor AG Type: NEGATIVE
Donor AG Type: NEGATIVE
Unit division: 0

## 2022-09-07 LAB — BPAM RBC: Blood Product Expiration Date: 202407202359

## 2022-09-07 LAB — SURGICAL PATHOLOGY

## 2022-09-07 MED ORDER — TRAMADOL HCL 50 MG PO TABS
50.0000 mg | ORAL_TABLET | Freq: Four times a day (QID) | ORAL | 0 refills | Status: DC | PRN
  Filled 2022-09-07: qty 20, 3d supply, fill #0

## 2022-09-07 NOTE — Plan of Care (Signed)

## 2022-09-07 NOTE — Discharge Summary (Signed)
Physician Discharge Summary    Patient ID: Emily Valentine MRN: [id number] DOB/AGE: [birth year]  72 y.o.  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 Active Problems:   Right bundle branch block   Hypertension   Frequent PVCs   Hypothyroidism   History of diverticulitis of colon   Laryngopharyngeal reflux (LPR)   Endometriosis of small intestine   Rheumatoid arthritis (HCC)   Pernicious anemia   Asthma   CKD (chronic kidney disease) stage 3, GFR 30-59 ml/min (HCC)   4 Days Post-Op  09/03/2022  POST-OPERATIVE DIAGNOSIS:   STRICTURE OF COLON, ENDOMETRIOSIS OF SMALL INTESTINE  SURGERY:  09/03/2022  Procedure(s): ROBOTIC LOW ANTERIOR RECTOSIGMOID RESECTION, SMALL INTESTINE REPAIR X3, TRANSVERSUS ABDOMINIS  PLANE (TAP) BLOCK - BILATERAL RIGID PROCTOSCOPY  SURGEON:    Surgeon(s): Karie Soda, MD Romie Levee, MD  Consults: Wound ostomy consult nurse (WOCN) and Anesthesia  Hospital Course:   The patient underwent the surgery above.  Postoperatively, the patient gradually mobilized and advanced to a solid diet.  Pain and other symptoms were treated aggressively.    By the time of discharge, the patient was walking well the hallways, eating food, having flatus.  Pain was well-controlled on an oral medications.  Based on meeting discharge criteria and continuing to recover, I felt it was safe for the patient to be discharged from the hospital to further recover with close followup.   Patient and daughter and nursing agreed.  Postoperative recommendations were discussed in detail to the patient and her daughter at bedside..  They are written as well.  Discharged Condition: good  Discharge Exam: Blood pressure (!) 130/96, pulse 81, temperature 97.8 F (36.6 C), temperature source Oral, resp. rate 18, height 5\' 2"  (1.575 m), weight 64.8 kg, SpO2 100 %.  General: Pt awake/alert/oriented x4 in No acute distress Eyes: PERRL, normal EOM.  Sclera clear.  No icterus Neuro: CN II-XII intact w/o focal sensory/motor deficits. Lymph: No head/neck/groin lymphadenopathy Psych:  No delerium/psychosis/paranoia HENT: Normocephalic, Mucus membranes moist.  No thrush Neck: Supple, No tracheal deviation Chest:  No chest wall pain w good excursion CV:  Pulses intact.  Regular rhythm MS: Normal AROM mjr joints.  No obvious deformity Abdomen: Soft.  Nondistended.  Mildly tender at incisions only.  Incisions clean dry and intact.  Normal healing ridges.  No evidence of peritonitis.  No incarcerated hernias. Ext:  SCDs BLE.  No mjr edema.  No cyanosis Skin: No petechiae / purpura   Disposition:    Follow-up Information     Karie Soda, MD Follow up on 10/05/2022.   Specialties: General Surgery, Colon and Rectal Surgery Why: To follow up after your operation Contact information: [address] [place] [address] [phone]                 Discharge disposition: 01-Home or Self Care       Discharge Instructions     Call MD for:   Complete by: As directed    FEVER > 101.5 F  (temperatures < 101.5 F are not  significant)   Call MD for:  extreme fatigue   Complete by: As directed    Call MD for:  persistant dizziness or light-headedness   Complete by: As directed    Call MD for:  persistant nausea and vomiting   Complete by: As directed    Call MD for:  redness, tenderness, or signs of infection (pain, swelling, redness, odor or green/yellow  discharge around incision site)   Complete by: As directed    Call MD for:  severe uncontrolled pain   Complete by: As directed    Diet - low sodium heart healthy   Complete by: As directed    Start with a bland diet such as soups, liquids, starchy foods, low fat foods, etc. the first few days at home. Gradually advance to a solid, low-fat, high fiber diet by the end of the first week at home.   Add a fiber supplement to your diet (Metamucil, etc) If you feel full, bloated, or constipated, stay on a full liquid or pureed/blenderized diet for a few days until you feel better and are no longer constipated.   Discharge instructions   Complete by: As directed    See Discharge Instructions If you are not getting better after two weeks or are noticing you are getting worse, contact our office [phone] for further advice.  We may need to adjust your medications, re-evaluate you in the office, send you to the emergency room, or see what other things we can do to help. The clinic staff is available to answer your questions during regular business hours (8:30am-5pm).  Please don't hesitate to call and ask to speak to one of our nurses for clinical concerns.    A surgeon from [facility] is always on call at the hospitals 24 hours/day If you have a medical emergency, go to the nearest emergency room or call 911.   Discharge wound care:   Complete by: As directed    It is good for closed incisions and even open wounds to be washed every day.  Shower every day.  Short baths are fine.  Wash the incisions and wounds clean with soap & water.    You may leave closed incisions open to air if it is dry.   You may cover the incision with clean gauze & replace it after your daily shower for comfort.  TEGADERM:  You have clear gauze band-aid dressings over your closed incision(s).  Remove the dressings 3 days after surgery = Sunday 6/23   Driving Restrictions   Complete by: As directed    You  may drive when: - you are no longer taking narcotic prescription pain medication - you can comfortably wear a seatbelt - you can safely make sudden turns/stops without pain.   Increase activity slowly   Complete by: As directed    Start light daily activities --- self-care, walking, climbing stairs- beginning the day after surgery.  Gradually increase activities as tolerated.  Control your pain to be active.  Stop when you are tired.  Ideally, walk several times a day, eventually an hour a day.   Most people are back to most day-to-day activities in a few weeks.  It takes 4-6 weeks to get back to unrestricted, intense activity. If you can walk 30 minutes without difficulty, it is safe to try more intense activity such as jogging, treadmill, bicycling, low-impact aerobics, swimming, etc. Save the most intensive and strenuous activity for last (Usually 4-8 weeks after surgery)  such as sit-ups, heavy lifting, contact sports, etc.  Refrain from any intense heavy lifting or straining until you are off narcotics for pain control.  You will have off days, but things should improve week-by-week. DO NOT PUSH THROUGH PAIN.  Let pain be your guide: If it hurts to do something, don't do it.   Lifting restrictions   Complete by: As directed    If you can walk 30 minutes without difficulty, it is safe to try more intense activity such as jogging, treadmill, bicycling, low-impact aerobics, swimming, etc. Save the most intensive and strenuous activity for last (Usually 4-8 weeks after surgery) such as sit-ups, heavy lifting, contact sports, etc.   Refrain from any intense heavy lifting or straining until you are off narcotics for pain control.  You will have off days, but things should improve week-by-week. DO NOT PUSH THROUGH PAIN.  Let pain be your guide: If it hurts to do something, don't do it.  Pain is your body warning you to avoid that activity for another week until the pain goes down.   May shower / Bathe    Complete by: As directed    May walk up steps   Complete by: As directed    Sexual Activity Restrictions   Complete by: As directed    You may have sexual intercourse when it is comfortable. If it hurts to do something, stop.       Allergies as of 09/07/2022       Reactions   Codeine Nausea Only   Oxycontin [oxycodone] Nausea And Vomiting        Medication List     STOP taking these medications    pantoprazole 40 MG tablet Commonly known as: PROTONIX   Trulance 3 MG Tabs Generic drug: Plecanatide       TAKE these medications    acetaminophen 500 MG tablet Commonly known as: TYLENOL Take 500 mg by mouth every 6 (six) hours as needed for moderate pain.   albuterol 108 (90 Base) MCG/ACT inhaler Commonly known as: VENTOLIN HFA Inhale 2 puffs into the lungs every 6 (six) hours as needed for wheezing or shortness of breath.   aspirin EC 81 MG tablet Take 81 mg by mouth in the morning. Swallow whole.   Benefiber Powd Take 1 Dose by mouth See admin instructions. Take 1 dose = 2 teaspoons by mouth once to twice daily   Breztri Aerosphere 160-9-4.8 MCG/ACT Aero Generic drug: Budeson-Glycopyrrol-Formoterol Inhale 2 puffs into the lungs 2 (two) times daily.   calcium carbonate 500 MG chewable tablet Commonly known as: TUMS - dosed in mg elemental calcium Chew 2 tablets by mouth daily as needed for indigestion or heartburn.   cyanocobalamin 1000 MCG/ML injection Commonly known as: VITAMIN B12 Inject 1,000 mcg into the muscle every 30 (thirty) days.   diphenhydramine-acetaminophen 25-500 MG Tabs tablet Commonly known as: TYLENOL PM Take 1-2 tablets by mouth at bedtime as needed (pain.).   folic acid 1 MG tablet Commonly known as: FOLVITE Take 1 mg by mouth in the morning.   hydrochlorothiazide 25 MG tablet Commonly known as: HYDRODIURIL TAKE (1) TABLET BY MOUTH ONCE DAILY.   levothyroxine 88 MCG tablet Commonly known as: SYNTHROID Take 88 mcg by mouth  daily before breakfast.   losartan 100 MG tablet Commonly known as: COZAAR Take 100 mg by mouth in the morning.   methotrexate 2.5 MG tablet Commonly known as: RHEUMATREX Take 10 mg by mouth every Monday.   metoprolol succinate  25 MG 24 hr tablet Commonly known as: TOPROL-XL TAKE (1) TABLET BY MOUTH DAILY.   multivitamin with minerals Tabs tablet Take 1 tablet by mouth in the morning.   traMADol 50 MG tablet Commonly known as: ULTRAM Take 1-2 tablets (50-100 mg total) by mouth every 6 (six) hours as needed for moderate pain.               Discharge Care Instructions  (From admission, onward)           Start     Ordered   09/03/22 0000  Discharge wound care:       Comments: It is good for closed incisions and even open wounds to be washed every day.  Shower every day.  Short baths are fine.  Wash the incisions and wounds clean with soap & water.    You may leave closed incisions open to air if it is dry.   You may cover the incision with clean gauze & replace it after your daily shower for comfort.  TEGADERM:  You have clear gauze band-aid dressings over your closed incision(s).  Remove the dressings 3 days after surgery = Sunday 6/23   09/03/22 1443            Significant Diagnostic Studies:  Results for orders placed or performed during the hospital encounter of 09/03/22 (from the past 72 hour(s))  Creatinine, serum     Status: Abnormal   Collection Time: 09/05/22  5:28 AM  Result Value Ref Range   Creatinine, Ser 1.54 (H) 0.44 - 1.00 mg/dL   GFR, Estimated 36 (L) >60 mL/min    Comment: (NOTE) Calculated using the CKD-EPI Creatinine Equation (2021) Performed at [facility], [address], [place], [place] [address]   CBC     Status: Abnormal   Collection Time: 09/05/22  5:28 AM  Result Value Ref Range   WBC 13.2 (H) 4.0 - 10.5 K/uL   RBC 2.98 (L) 3.87 - 5.11 MIL/uL   Hemoglobin 9.9 (L) 12.0 - 15.0 g/dL   HCT 08.6 (L) 57.8 -  46.0 %   MCV 95.6 80.0 - 100.0 fL   MCH 33.2 26.0 - 34.0 pg   MCHC 34.7 30.0 - 36.0 g/dL   RDW 46.9 62.9 - 52.8 %   Platelets 200 150 - 400 K/uL   nRBC 0.0 0.0 - 0.2 %    Comment: Performed at [facility], [address] [place], [place] [address]  Potassium     Status: Abnormal   Collection Time: 09/05/22  5:28 AM  Result Value Ref Range   Potassium 3.3 (L) 3.5 - 5.1 mmol/L    Comment: Performed at [facility], [address] [place], [place] [address]  Glucose, capillary     Status: Abnormal   Collection Time: 09/05/22  9:04 PM  Result Value Ref Range   Glucose-Capillary 124 (H) 70 - 99 mg/dL    Comment: Glucose reference range applies only to samples taken after fasting for at least 8 hours.  Creatinine, serum     Status: Abnormal   Collection Time: 09/06/22  4:45 AM  Result Value Ref Range   Creatinine, Ser 1.37 (H) 0.44 - 1.00 mg/dL   GFR, Estimated 41 (L) >60 mL/min    Comment: (NOTE) Calculated using the CKD-EPI Creatinine Equation (2021) Performed at [facility], [address] [place], [place] [address]     No results found.  Past Medical History:  Diagnosis Date   Asthma    Chronic constipation    COVID-19 11/2020   Diverticulitis 08/2016   Endometriosis of small intestine 09/03/2022   GERD (gastroesophageal reflux disease)    Hepatitis    Teens/20s   History of echocardiogram    Echo (8/15): Vigorous LV function, EF 65-70%, normal wall motion, grade 1 diastolic dysfunction, mild to moderate TR, PASP 31 mm Hg   Hx of cardiovascular stress test    ETT-Myoview (8/15): no ischemia, EF 56%, normal study   Hypertension    Hypothyroid    Pernicious anemia    Pneumonia    2022   PVC's (premature ventricular contractions)    Rheumatoid arthritis (HCC)    Right bundle branch block    S/P colonoscopy 2001, 2004   2001: hyperplastic polyp, 2004: normal, inflammatory polyp   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    Past Surgical History:  Procedure Laterality Date   ABDOMINAL HYSTERECTOMY     BACK SURGERY     BIOPSY  12/08/2021   Procedure: BIOPSY;  Surgeon: Corbin Ade, MD;  [facility];  Service: Endoscopy;;   BREAST BIOPSY Right 10/29/2010   BREAST BIOPSY Right 11/27/2014   BREAST EXCISIONAL BIOPSY Right 02/21/2015   BREAST LUMPECTOMY WITH RADIOACTIVE SEED LOCALIZATION Right 02/21/2015   Procedure: RIGHT BREAST LUMPECTOMY WITH RADIOACTIVE SEED LOCALIZATION;  Surgeon: Emelia Loron, MD;  Location: [facility];  Service: General;  Laterality: Right;   COLONOSCOPY  11/10/2010   Procedure: COLONOSCOPY;  Surgeon: Corbin Ade, MD;  [facility];  Service: Endoscopy;  Laterality: N/A;   COLONOSCOPY N/A 09/28/2016   Dr. Jena Gauss; Diverticulosis with evidence of recent diverticulitis.  Next colonoscopy in 2023 given history of tubular adenomas in the past.   COLONOSCOPY WITH PROPOFOL N/A 12/08/2021   Procedure: COLONOSCOPY WITH PROPOFOL;  Surgeon: Corbin Ade, MD;  [facility];  Service: Endoscopy;  Laterality: N/A;  11:00am   POLYPECTOMY  12/08/2021   Procedure: POLYPECTOMY;  Surgeon: Corbin Ade, MD;  [facility];  Service: Endoscopy;;   PROCTOSCOPY N/A 09/03/2022   Procedure: RIGID PROCTOSCOPY;  Surgeon: Karie Soda, MD;  Location: [facility];  Service: General;  Laterality: N/A;   SUBMUCOSAL TATTOO INJECTION  12/08/2021   Procedure: SUBMUCOSAL TATTOO INJECTION;  Surgeon: Corbin Ade, MD;  [facility];  Service: Endoscopy;;   THYROIDECTOMY      Social History   Socioeconomic History   Marital status: Widowed    Spouse name: Not on file   Number of children: Not on file   Years of education: Not on file   Highest education level: Not on file  Occupational History   Not on file  Tobacco Use   Smoking status: Former    Packs/day: 0.50    Years: 35.00    Additional  pack years: 0.00    Total pack years: 17.50    Types: Cigarettes   Smokeless tobacco: Former   Tobacco comments:    3-4 cigs per day 07/30/20//lmr  Vaping Use   Vaping Use: Never used  Substance and Sexual Activity   Alcohol use: No   Drug use: No   Sexual activity: Yes    Birth control/protection: Surgical  Other Topics Concern   Not on file  Social History Narrative   Not on file   Social Determinants of Health   Financial Resource Strain: Medium Risk (01/22/2020)   Overall Financial  Resource Strain (CARDIA)    Difficulty of Paying Living Expenses: Somewhat hard  Food Insecurity: No Food Insecurity (09/03/2022)   Hunger Vital Sign    Worried About Running Out of Food in the Last Year: Never true    Ran Out of Food in the Last Year: Never true  Transportation Needs: No Transportation Needs (09/03/2022)   PRAPARE - Administrator, Civil Service (Medical): No    Lack of Transportation (Non-Medical): No  Physical Activity: Insufficiently Active (01/22/2020)   Exercise Vital Sign    Days of Exercise per Week: 1 day    Minutes of Exercise per Session: 60 min  Stress: Stress Concern Present (01/22/2020)   [employer] of Occupational Health - Occupational Stress Questionnaire    Feeling of Stress : To some extent  Social Connections: Socially Integrated (01/22/2020)   Social Connection and Isolation Panel [NHANES]    Frequency of Communication with Friends and Family: More than three times a week    Frequency of Social Gatherings with Friends and Family: Once a week    Attends Religious Services: More than 4 times per year    Active Member of [employer] or Organizations: Yes    Attends Engineer, structural: More than 4 times per year    Marital Status: Married  Catering manager Violence: Not At Risk (09/03/2022)   Humiliation, Afraid, Rape, and Kick questionnaire    Fear of Current or Ex-Partner: No    Emotionally Abused: No    Physically Abused: No     Sexually Abused: No    Family History  Problem Relation Age of Onset   Stomach cancer Mother        deceased   Anemia Mother    Cancer Mother    Hypertension Mother    Thyroid disease Mother    Colon cancer Father        diagnosed age 75, died at age 63   Anemia Father    Cancer Father    Hypertension Father    Anemia Sister    Cancer Brother    Diabetes Sister    Hypertension Sister    Hypertension Brother    Thyroid disease Brother    Thyroid disease Sister     Current Facility-Administered Medications  Medication Dose Route Frequency Provider Last Rate Last Admin   0.9 %  sodium chloride infusion  250 mL Intravenous PRN Karie Soda, MD       acetaminophen (TYLENOL) tablet 1,000 mg  1,000 mg Oral Trecia Rogers, MD   1,000 mg at 09/07/22 0621   albuterol (PROVENTIL) (2.5 MG/3ML) 0.083% nebulizer solution 3 mL  3 mL Inhalation Q6H PRN Karie Soda, MD   3 mL at 09/04/22 0501   alum & mag hydroxide-simeth (MAALOX/MYLANTA) 200-200-20 MG/5ML suspension 30 mL  30 mL Oral Q6H PRN Karie Soda, MD       aspirin EC tablet 81 mg  81 mg Oral Daily Karie Soda, MD   81 mg at 09/06/22 0948   bismuth subsalicylate (PEPTO BISMOL) 262 MG/15ML suspension 30 mL  30 mL Oral Q8H PRN Karie Soda, MD       diphenhydrAMINE (BENADRYL) 12.5 MG/5ML elixir 12.5 mg  12.5 mg Oral Q6H PRN Karie Soda, MD       Or   diphenhydrAMINE (BENADRYL) injection 12.5 mg  12.5 mg Intravenous Q6H PRN Karie Soda, MD       enoxaparin (LOVENOX) injection 40 mg  40 mg Subcutaneous Q24H Karie Soda,  MD   40 mg at 09/06/22 0833   feeding supplement (ENSURE SURGERY) liquid 237 mL  237 mL Oral BID BM Karie Soda, MD   237 mL at 09/05/22 0924   fluconazole (DIFLUCAN) tablet 200 mg  200 mg Oral Daily Karie Soda, MD   200 mg at 09/06/22 2256   fluticasone furoate-vilanterol (BREO ELLIPTA) 200-25 MCG/ACT 1 puff  1 puff Inhalation Daily Ellington, Abby K, RPH       And   umeclidinium bromide (INCRUSE  ELLIPTA) 62.5 MCG/ACT 1 puff  1 puff Inhalation Daily Ellington, Abby K, RPH       folic acid (FOLVITE) tablet 1 mg  1 mg Oral Daily Karie Soda, MD   1 mg at 09/06/22 0948   hydrALAZINE (APRESOLINE) injection 10 mg  10 mg Intravenous Q2H PRN Karie Soda, MD       HYDROmorphone (DILAUDID) injection 0.5-2 mg  0.5-2 mg Intravenous Q4H PRN Karie Soda, MD       levothyroxine (SYNTHROID) tablet 88 mcg  88 mcg Oral Q0600 Karie Soda, MD   88 mcg at 09/07/22 0932   magic mouthwash  15 mL Oral QID PRN Karie Soda, MD       melatonin tablet 3 mg  3 mg Oral QHS PRN Karie Soda, MD       menthol-cetylpyridinium (CEPACOL) lozenge 3 mg  1 lozenge Oral PRN Karie Soda, MD       methocarbamol (ROBAXIN) 1,000 mg in dextrose 5 % 100 mL IVPB  1,000 mg Intravenous Q6H PRN Karie Soda, MD       methocarbamol (ROBAXIN) tablet 1,000 mg  1,000 mg Oral Q6H PRN Karie Soda, MD       metoprolol tartrate (LOPRESSOR) injection 5 mg  5 mg Intravenous Q6H PRN Karie Soda, MD       metoprolol tartrate (LOPRESSOR) injection 5 mg  5 mg Intravenous Q6H PRN Karie Soda, MD       metoprolol tartrate (LOPRESSOR) tablet 12.5 mg  12.5 mg Oral BID Karie Soda, MD   12.5 mg at 09/06/22 2113   ondansetron (ZOFRAN) tablet 4 mg  4 mg Oral Q6H PRN Karie Soda, MD   4 mg at 09/06/22 1515   Or   ondansetron (ZOFRAN) injection 4 mg  4 mg Intravenous Q6H PRN Karie Soda, MD   4 mg at 09/05/22 2008   pantoprazole (PROTONIX) EC tablet 40 mg  40 mg Oral Q1200 Karie Soda, MD   40 mg at 09/06/22 1119   phenol (CHLORASEPTIC) mouth spray 2 spray  2 spray Mouth/Throat PRN Karie Soda, MD       polycarbophil (FIBERCON) tablet 625 mg  625 mg Oral BID Karie Soda, MD   625 mg at 09/06/22 2113   prochlorperazine (COMPAZINE) tablet 10 mg  10 mg Oral Q6H PRN Karie Soda, MD       Or   prochlorperazine (COMPAZINE) injection 5-10 mg  5-10 mg Intravenous Q6H PRN Karie Soda, MD   5 mg at 09/03/22 1859   simethicone  (MYLICON) chewable tablet 40 mg  40 mg Oral Q6H PRN Karie Soda, MD       sodium chloride flush (NS) 0.9 % injection 3 mL  3 mL Intravenous Catha Gosselin, MD   3 mL at 09/05/22 0924   sodium chloride flush (NS) 0.9 % injection 3 mL  3 mL Intravenous PRN Karie Soda, MD       traMADol Janean Sark) tablet 50-100 mg  50-100 mg Oral Q6H PRN  Karie Soda, MD   100 mg at 09/06/22 2204     Allergies  Allergen Reactions   Codeine Nausea Only   Oxycontin [Oxycodone] Nausea And Vomiting    Signed:   Ardeth Sportsman, MD, FACS, MASCRS Esophageal, Gastrointestinal & Colorectal Surgery Robotic and Minimally Invasive Surgery  [facility] [address], [address] [place], [place] [address] [phone] Fax [phone] Main  CONTACT INFORMATION:  Weekday (9AM-5PM): Call [facility] main office at [phone]  Weeknight (5PM-9AM) or Weekend/Holiday: Check [email or web address] (password " [email or web address]") for General Surgery [facility] coverage  (Please, do not use SecureChat as it is not reliable communication to reach operating surgeons for immediate patient care given surgeries/outpatient duties/clinic/cross-coverage/off post-call which would lead to a delay in care.  Epic staff messaging available for outptient concerns, but may not be answered for 48 hours or more).     09/07/2022, 7:57 AM

## 2022-09-07 NOTE — Progress Notes (Signed)
Reviewed written discharge instructions with patient. All questions answered. Patient verbalized understanding. Discharged via wheelchair with all belongings... in stable condition. 

## 2022-09-07 NOTE — Progress Notes (Signed)
   09/07/22 0938  TOC Brief Assessment  Insurance and Status Reviewed  Patient has primary care physician Yes  Home environment has been reviewed Resides alone  Prior level of function: Independent at baseline  Prior/Current Home Services No current home services  Social Determinants of Health Reivew SDOH reviewed no interventions necessary  Readmission risk has been reviewed Yes  Transition of care needs no transition of care needs at this time

## 2022-09-11 ENCOUNTER — Ambulatory Visit: Payer: Medicare PPO | Admitting: Nurse Practitioner

## 2022-09-21 NOTE — Congregational Nurse Program (Unsigned)
Patient request monthly Vitamin B12 injection. Gave in left buttock. No complications

## 2022-09-29 DIAGNOSIS — M549 Dorsalgia, unspecified: Secondary | ICD-10-CM | POA: Diagnosis not present

## 2022-09-29 DIAGNOSIS — J449 Chronic obstructive pulmonary disease, unspecified: Secondary | ICD-10-CM | POA: Diagnosis not present

## 2022-09-29 DIAGNOSIS — N289 Disorder of kidney and ureter, unspecified: Secondary | ICD-10-CM | POA: Diagnosis not present

## 2022-09-29 DIAGNOSIS — Z79899 Other long term (current) drug therapy: Secondary | ICD-10-CM | POA: Diagnosis not present

## 2022-09-29 DIAGNOSIS — M1990 Unspecified osteoarthritis, unspecified site: Secondary | ICD-10-CM | POA: Diagnosis not present

## 2022-09-29 DIAGNOSIS — R21 Rash and other nonspecific skin eruption: Secondary | ICD-10-CM | POA: Diagnosis not present

## 2022-09-29 DIAGNOSIS — M8580 Other specified disorders of bone density and structure, unspecified site: Secondary | ICD-10-CM | POA: Diagnosis not present

## 2022-09-29 DIAGNOSIS — M059 Rheumatoid arthritis with rheumatoid factor, unspecified: Secondary | ICD-10-CM | POA: Diagnosis not present

## 2022-10-03 ENCOUNTER — Ambulatory Visit
Admission: RE | Admit: 2022-10-03 | Discharge: 2022-10-03 | Disposition: A | Discharge status: Home / Self Care | Payer: Medicare PPO | Source: Ambulatory Visit | Attending: Internal Medicine | Admitting: Internal Medicine

## 2022-10-03 VITALS — BP 179/112 | HR 110 | Temp 99.2°F | Resp 20 | Ht 63.0 in | Wt 138.0 lb

## 2022-10-03 DIAGNOSIS — U071 COVID-19: Secondary | ICD-10-CM

## 2022-10-03 MED ORDER — BENZONATATE 100 MG PO CAPS: 100.0000 mg | ORAL_CAPSULE | Freq: Three times a day (TID) | ORAL | 0 refills | Status: DC

## 2022-10-03 NOTE — Discharge Instructions (Signed)
You were seen today for COVID.  You took a home test which was positive.  We recommend rest and fluids.  I recommend that you take Tylenol OTC for headache, fever body aches.  You can do hot teas or salt water gargles for the sore throat.  I am giving you prescription for a cough suppressant that you can take every 8 hours as needed.  Please follow-up with your PCP if symptoms persist or worsen.

## 2022-10-03 NOTE — ED Provider Notes (Signed)
[facility]    CSN: [id number] Arrival date & time: 10/03/22  1145      History   Chief Complaint Chief Complaint  Patient presents with   Cough    + COVID19 test at home    HPI Emily Valentine is a 72 y.o. female with a history of asthma, HTN, CKD, hypothyroidism presents to urgent care with complaint of headache, runny nose, nasal congestion, sore throat and cough.  This started yesterday.  The headache has resolved.  She is blowing clear mucus out of her nose.  She denies difficulty swallowing.  The cough is nonproductive.  She denies ear pain, shortness of breath, chest pain, nausea, vomiting or diarrhea.  She denies fever but has had chills and body aches.  She has taken ibuprofen OTC with some relief of symptoms.  She has not had sick contacts that she is aware of.  She took a home COVID test yesterday which was positive.  HPI  Past Medical History:  Diagnosis Date   Asthma    Chronic constipation    COVID-19 11/2020   Diverticulitis 08/2016   Endometriosis of small intestine 09/03/2022   GERD (gastroesophageal reflux disease)    Hepatitis    Teens/20s   History of echocardiogram    Echo (8/15): Vigorous LV function, EF 65-70%, normal wall motion, grade 1 diastolic dysfunction, mild to moderate TR, PASP 31 mm Hg   Hx of cardiovascular stress test    ETT-Myoview (8/15): no ischemia, EF 56%, normal study   Hypertension    Hypothyroid    Pernicious anemia    Pneumonia    2022   PVC's (premature ventricular contractions)    Rheumatoid arthritis (HCC)    Right bundle branch block    S/P colonoscopy 2001, 2004   2001: hyperplastic polyp, 2004: normal, inflammatory polyp   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    Patient Active Problem List   Diagnosis Date Noted   CKD (chronic kidney disease) stage 3, GFR 30-59 ml/min (HCC) 09/04/2022   Endometriosis of small intestine 09/03/2022   Rheumatoid arthritis (HCC) 09/03/2022   Pernicious  anemia 09/03/2022   Asthma 09/03/2022   History of diverticulitis of colon 07/22/2022   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 07/22/2022   Hoarseness 01/29/2022   Impacted cerumen of right ear 01/29/2022   Laryngopharyngeal reflux (LPR) 01/29/2022   Pneumonia due to parainfluenza virus 08/04/2021   Hypokalemia 08/03/2021   Hypomagnesemia 08/03/2021   Hypothyroidism 08/03/2021   Sepsis (HCC)    Hypotension due to hypovolemia    COPD with acute exacerbation (HCC) 07/30/2020   Chronic constipation 04/19/2018   Memory changes 06/26/2017   Mass of right breast 01/17/2015   Perimenopausal vasomotor symptoms 09/05/2014   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   Palpitations 10/04/2013   Right bundle branch block 08/28/2011   Cigarette smoker 08/28/2011   Hypertension 08/28/2011   Encounter for screening colonoscopy 10/14/2010    Past Surgical History:  Procedure Laterality Date   ABDOMINAL HYSTERECTOMY     BACK SURGERY     BIOPSY  12/08/2021   Procedure: BIOPSY;  Surgeon: Corbin Ade, MD;  Location: [facility];  Service: Endoscopy;;   BREAST BIOPSY Right 10/29/2010   BREAST BIOPSY Right 11/27/2014   BREAST EXCISIONAL BIOPSY Right 02/21/2015   BREAST LUMPECTOMY WITH RADIOACTIVE SEED LOCALIZATION Right 02/21/2015   Procedure: RIGHT BREAST LUMPECTOMY WITH RADIOACTIVE SEED LOCALIZATION;  Surgeon: Emelia Loron, MD;  Location: [facility];  Service: General;  Laterality: Right;   COLONOSCOPY  11/10/2010   Procedure: COLONOSCOPY;  Surgeon: Corbin Ade, MD;  Location: [facility];  Service: Endoscopy;  Laterality: N/A;   COLONOSCOPY N/A 09/28/2016   Dr. Jena Gauss; Diverticulosis with evidence of recent diverticulitis.  Next colonoscopy in 2023 given history of tubular adenomas in the past.   COLONOSCOPY WITH PROPOFOL N/A 12/08/2021   Procedure: COLONOSCOPY WITH PROPOFOL;  Surgeon: Corbin Ade, MD;  [facility]: [facility];  Service: Endoscopy;  Laterality:  N/A;  11:00am   POLYPECTOMY  12/08/2021   Procedure: POLYPECTOMY;  Surgeon: Corbin Ade, MD;  [facility]: [facility];  Service: Endoscopy;;   PROCTOSCOPY N/A 09/03/2022   Procedure: RIGID PROCTOSCOPY;  Surgeon: Karie Soda, MD;  Location: [facility];  Service: General;  Laterality: N/A;   SUBMUCOSAL TATTOO INJECTION  12/08/2021   Procedure: SUBMUCOSAL TATTOO INJECTION;  Surgeon: Corbin Ade, MD;  Location: [facility];  Service: Endoscopy;;   THYROIDECTOMY      OB History     Gravida  3   Para  3   Term      Preterm      AB      Living  3      SAB      IAB      Ectopic      Multiple      Live Births               Home Medications    Prior to Admission medications   Medication Sig Start Date End Date Taking? Authorizing Provider  aspirin EC 81 MG tablet Take 81 mg by mouth in the morning. Swallow whole.   Yes [provider]  benzonatate (TESSALON) 100 MG capsule Take 1 capsule (100 mg total) by mouth every 8 (eight) hours. 10/03/22  Yes Cadence Haslam, Salvadore Oxford, NP  Budeson-Glycopyrrol-Formoterol (BREZTRI AEROSPHERE) 160-9-4.8 MCG/ACT AERO Inhale 2 puffs into the lungs 2 (two) times daily.   Yes [provider]  cyanocobalamin (,VITAMIN B-12,) 1000 MCG/ML injection Inject 1,000 mcg into the muscle every 30 (thirty) days. 05/30/21  Yes [provider]  diphenhydramine-acetaminophen (TYLENOL PM) 25-500 MG TABS tablet Take 1-2 tablets by mouth at bedtime as needed (pain.).   Yes [provider]  hydrochlorothiazide (HYDRODIURIL) 25 MG tablet TAKE (1) TABLET BY MOUTH ONCE DAILY. 11/10/21  Yes Wendall Stade, MD  levothyroxine (SYNTHROID, LEVOTHROID) 88 MCG tablet Take 88 mcg by mouth daily before breakfast.   Yes [provider]  losartan (COZAAR) 100 MG tablet Take 100 mg by mouth in the morning. 07/23/21  Yes [provider]  methotrexate (RHEUMATREX) 2.5 MG tablet Take 10 mg by mouth every Monday. 05/18/22  Yes  [provider]  metoprolol succinate (TOPROL-XL) 25 MG 24 hr tablet TAKE (1) TABLET BY MOUTH DAILY. 08/09/20  Yes Leone Brand, NP  Multiple Vitamin (MULTIVITAMIN WITH MINERALS) TABS tablet Take 1 tablet by mouth in the morning.   Yes [provider]  traMADol (ULTRAM) 50 MG tablet Take 1 - 2 tablets by mouth every 6 hours as needed for moderate pain. 09/07/22  Yes Karie Soda, MD  Wheat Dextrin (BENEFIBER) POWD Take 1 Dose by mouth See admin instructions. Take 1 dose = 2 teaspoons by mouth once to twice daily   Yes [provider]  acetaminophen (TYLENOL) 500 MG tablet Take 500 mg by mouth every 6 (six) hours as needed for moderate pain.  [provider]  albuterol (VENTOLIN HFA) 108 (90 Base) MCG/ACT inhaler Inhale 2 puffs into the lungs every 6 (six) hours as needed for wheezing or shortness of breath.    [provider]  calcium carbonate (TUMS - DOSED IN MG ELEMENTAL CALCIUM) 500 MG chewable tablet Chew 2 tablets by mouth daily as needed for indigestion or heartburn.    [provider]  folic acid (FOLVITE) 1 MG tablet Take 1 mg by mouth in the morning. 03/26/22   [provider]    Family History Family History  Problem Relation Age of Onset   Stomach cancer Mother        deceased   Anemia Mother    Cancer Mother    Hypertension Mother    Thyroid disease Mother    Colon cancer Father        diagnosed age 52, died at age 70   Anemia Father    Cancer Father    Hypertension Father    Anemia Sister    Cancer Brother    Diabetes Sister    Hypertension Sister    Hypertension Brother    Thyroid disease Brother    Thyroid disease Sister     Social History Social History   Tobacco Use   Smoking status: Former    Current packs/day: 0.50    Average packs/day: 0.5 packs/day for 35.0 years (17.5 ttl pk-yrs)    Types: Cigarettes   Smokeless tobacco: Former   Tobacco comments:    3-4 cigs per day 07/30/20//lmr   Vaping Use   Vaping status: Never Used  Substance Use Topics   Alcohol use: No   Drug use: No     Allergies   Codeine and Oxycontin [oxycodone]   Review of Systems Review of Systems  Constitutional:  Positive for fatigue. Negative for chills and fever.  HENT:  Positive for congestion, rhinorrhea and sore throat. Negative for ear pain, sinus pressure and sinus pain.   Eyes:  Negative for pain and redness.  Respiratory:  Positive for cough. Negative for chest tightness and shortness of breath.   Cardiovascular:  Negative for chest pain.  Gastrointestinal:  Negative for diarrhea, nausea and vomiting.  Musculoskeletal:  Positive for arthralgias and myalgias.  Skin:  Negative for rash.  Neurological:  Positive for headaches. Negative for dizziness and weakness.     Physical Exam Triage Vital Signs ED Triage Vitals  Encounter Vitals Group     BP 10/03/22 1213 (!) 179/112     Systolic BP Percentile --      Diastolic BP Percentile --      Pulse Rate 10/03/22 1213 (!) 113     Resp 10/03/22 1213 20     Temp 10/03/22 1213 99.2 F (37.3 C)     Temp Source 10/03/22 1213 Oral     SpO2 10/03/22 1213 95 %     Weight 10/03/22 1209 138 lb (62.6 kg)     Height 10/03/22 1209 5\' 3"  (1.6 m)     Head Circumference --      Peak Flow --      Pain Score 10/03/22 1209 0     Pain Loc --      Pain Education --      Exclude from Growth Chart --    No data found.  Updated Vital Signs BP (!) 179/112 (BP Location: Left Arm)   Pulse (!) 110   Temp 99.2 F (37.3 C) (Oral)   Resp 20  Ht 5\' 3"  (1.6 m)   Wt 138 lb (62.6 kg)   SpO2 96%   BMI 24.45 kg/m       Physical Exam Constitutional:      General: She is not in acute distress. HENT:     Head: Normocephalic.     Comments: No sinus pressure noted    Right Ear: Tympanic membrane, ear canal and external ear normal.     Left Ear: Tympanic membrane, ear canal and external ear normal.     Nose: Congestion present. No rhinorrhea.      Mouth/Throat:     Mouth: Mucous membranes are moist.     Pharynx: No oropharyngeal exudate or posterior oropharyngeal erythema.  Eyes:     Extraocular Movements: Extraocular movements intact.     Conjunctiva/sclera: Conjunctivae normal.     Pupils: Pupils are equal, round, and reactive to light.  Cardiovascular:     Rate and Rhythm: Regular rhythm. Tachycardia present.     Heart sounds: Normal heart sounds.  Pulmonary:     Effort: Pulmonary effort is normal.     Breath sounds: Normal breath sounds. No wheezing, rhonchi or rales.  Lymphadenopathy:     Cervical: No cervical adenopathy.  Skin:    General: Skin is warm and dry.     Findings: No erythema.  Neurological:     General: No focal deficit present.     Mental Status: She is alert and oriented to person, place, and time.     Gait: Gait normal.      UC Treatments / Results  Labs   EKG   Radiology No results found.  Procedures Procedures (including critical care time)  Medications Ordered in UC Medications - No data to display  Initial Impression / Assessment and Plan / UC Course  I have reviewed the triage vital signs and the nursing notes.  Pertinent labs & imaging results that were available during my care of the patient were reviewed by me and considered in my medical decision making (see chart for details).     72 year old with 1 day history of URI symptoms, tested positive for COVID yesterday.  Her symptoms appear mild and given her history of CKD, would avoid Paxlovid at this time.  Encourage rest and fluids.  Would recommend she try Tylenol instead of ibuprofen for headache and bodyaches.  Rx for Tessalon 100 mg 3 times daily as needed.  Advised her to follow-up with her PCP if symptoms persist or worsen.  Final Clinical Impressions(s) / UC Diagnoses   Final diagnoses:  COVID-19     Discharge Instructions      You were seen today for COVID.  You took a home test which was positive.  We recommend  rest and fluids.  I recommend that you take Tylenol OTC for headache, fever body aches.  You can do hot teas or salt water gargles for the sore throat.  I am giving you prescription for a cough suppressant that you can take every 8 hours as needed.  Please follow-up with your PCP if symptoms persist or worsen.    ED Prescriptions     Medication Sig Dispense Auth. Provider   benzonatate (TESSALON) 100 MG capsule Take 1 capsule (100 mg total) by mouth every 8 (eight) hours. 21 capsule Lorre Munroe, NP      PDMP not reviewed this encounter.   Lorre Munroe, NP 10/03/22 1248

## 2022-10-03 NOTE — ED Triage Notes (Signed)
"  My daughter tested me this morning and COVID19 test was positive". Current symptoms "runny nose, congestion, cough, all starting yesterday". No fever.

## 2022-10-12 DIAGNOSIS — H6590 Unspecified nonsuppurative otitis media, unspecified ear: Secondary | ICD-10-CM | POA: Diagnosis not present

## 2022-10-12 DIAGNOSIS — J449 Chronic obstructive pulmonary disease, unspecified: Secondary | ICD-10-CM | POA: Diagnosis not present

## 2022-10-12 DIAGNOSIS — U099 Post covid-19 condition, unspecified: Secondary | ICD-10-CM | POA: Diagnosis not present

## 2022-10-12 DIAGNOSIS — Z6823 Body mass index (BMI) 23.0-23.9, adult: Secondary | ICD-10-CM | POA: Diagnosis not present

## 2022-10-12 DIAGNOSIS — Z20828 Contact with and (suspected) exposure to other viral communicable diseases: Secondary | ICD-10-CM | POA: Diagnosis not present

## 2022-11-10 ENCOUNTER — Other Ambulatory Visit: Payer: Self-pay | Admitting: Cardiovascular Disease

## 2022-11-30 DIAGNOSIS — N1832 Chronic kidney disease, stage 3b: Secondary | ICD-10-CM | POA: Diagnosis not present

## 2022-11-30 DIAGNOSIS — R809 Proteinuria, unspecified: Secondary | ICD-10-CM | POA: Diagnosis not present

## 2022-11-30 DIAGNOSIS — D510 Vitamin B12 deficiency anemia due to intrinsic factor deficiency: Secondary | ICD-10-CM | POA: Diagnosis not present

## 2022-11-30 DIAGNOSIS — I129 Hypertensive chronic kidney disease with stage 1 through stage 4 chronic kidney disease, or unspecified chronic kidney disease: Secondary | ICD-10-CM | POA: Diagnosis not present

## 2022-11-30 DIAGNOSIS — R3129 Other microscopic hematuria: Secondary | ICD-10-CM | POA: Diagnosis not present

## 2022-11-30 DIAGNOSIS — M069 Rheumatoid arthritis, unspecified: Secondary | ICD-10-CM | POA: Diagnosis not present

## 2023-01-04 DIAGNOSIS — J439 Emphysema, unspecified: Secondary | ICD-10-CM | POA: Diagnosis not present

## 2023-01-04 DIAGNOSIS — Z6824 Body mass index (BMI) 24.0-24.9, adult: Secondary | ICD-10-CM | POA: Diagnosis not present

## 2023-01-04 DIAGNOSIS — E559 Vitamin D deficiency, unspecified: Secondary | ICD-10-CM | POA: Diagnosis not present

## 2023-01-04 DIAGNOSIS — Z0001 Encounter for general adult medical examination with abnormal findings: Secondary | ICD-10-CM | POA: Diagnosis not present

## 2023-01-04 DIAGNOSIS — Z1331 Encounter for screening for depression: Secondary | ICD-10-CM | POA: Diagnosis not present

## 2023-01-04 DIAGNOSIS — E039 Hypothyroidism, unspecified: Secondary | ICD-10-CM | POA: Diagnosis not present

## 2023-01-04 DIAGNOSIS — I10 Essential (primary) hypertension: Secondary | ICD-10-CM | POA: Diagnosis not present

## 2023-01-04 DIAGNOSIS — M069 Rheumatoid arthritis, unspecified: Secondary | ICD-10-CM | POA: Diagnosis not present

## 2023-01-11 DIAGNOSIS — M059 Rheumatoid arthritis with rheumatoid factor, unspecified: Secondary | ICD-10-CM | POA: Diagnosis not present

## 2023-01-11 DIAGNOSIS — M8580 Other specified disorders of bone density and structure, unspecified site: Secondary | ICD-10-CM | POA: Diagnosis not present

## 2023-01-11 DIAGNOSIS — M1990 Unspecified osteoarthritis, unspecified site: Secondary | ICD-10-CM | POA: Diagnosis not present

## 2023-01-11 DIAGNOSIS — M549 Dorsalgia, unspecified: Secondary | ICD-10-CM | POA: Diagnosis not present

## 2023-01-11 DIAGNOSIS — M25572 Pain in left ankle and joints of left foot: Secondary | ICD-10-CM | POA: Diagnosis not present

## 2023-01-11 DIAGNOSIS — J449 Chronic obstructive pulmonary disease, unspecified: Secondary | ICD-10-CM | POA: Diagnosis not present

## 2023-01-11 DIAGNOSIS — N289 Disorder of kidney and ureter, unspecified: Secondary | ICD-10-CM | POA: Diagnosis not present

## 2023-01-11 DIAGNOSIS — Z79899 Other long term (current) drug therapy: Secondary | ICD-10-CM | POA: Diagnosis not present

## 2023-01-18 ENCOUNTER — Other Ambulatory Visit: Payer: Self-pay | Admitting: Cardiovascular Disease

## 2023-01-25 ENCOUNTER — Other Ambulatory Visit (HOSPITAL_COMMUNITY)
Admission: RE | Admit: 2023-01-25 | Discharge: 2023-01-25 | Disposition: A | Discharge status: Home / Self Care | Payer: Medicare PPO | Source: Ambulatory Visit | Attending: Obstetrics & Gynecology | Admitting: Obstetrics & Gynecology

## 2023-01-25 ENCOUNTER — Ambulatory Visit: Payer: Medicare PPO | Admitting: Obstetrics & Gynecology

## 2023-01-25 ENCOUNTER — Encounter: Payer: Self-pay | Admitting: Obstetrics & Gynecology

## 2023-01-25 VITALS — BP 162/92 | HR 70 | Ht 62.0 in | Wt 133.0 lb

## 2023-01-25 DIAGNOSIS — N898 Other specified noninflammatory disorders of vagina: Secondary | ICD-10-CM | POA: Diagnosis not present

## 2023-01-25 DIAGNOSIS — Z01419 Encounter for gynecological examination (general) (routine) without abnormal findings: Secondary | ICD-10-CM

## 2023-01-25 DIAGNOSIS — N760 Acute vaginitis: Secondary | ICD-10-CM

## 2023-01-25 DIAGNOSIS — B9689 Other specified bacterial agents as the cause of diseases classified elsewhere: Secondary | ICD-10-CM | POA: Diagnosis not present

## 2023-01-25 DIAGNOSIS — L821 Other seborrheic keratosis: Secondary | ICD-10-CM | POA: Diagnosis not present

## 2023-01-25 MED ORDER — METRONIDAZOLE 0.75 % VA GEL: 1.0000 | Freq: Every day | VAGINAL | 5 refills | Status: DC

## 2023-01-25 NOTE — Progress Notes (Signed)
Subjective:     Emily Valentine is a 72 y.o. female here for a routine exam.  No LMP recorded. Patient has had a hysterectomy. G3P3 Birth Control Method:  abdominal hysterectomy Menstrual Calendar(currently): amenorrhea  Current complaints: "mole" on right leg.   Current acute medical issues:  healing up from exp lap for rectosigmoid stricture   Recent Gynecologic History No LMP recorded. Patient has had a hysterectomy. Last Pap: na,   Last mammogram: 2024,  normal  Past Medical History:  Diagnosis Date   Asthma    Chronic constipation    COVID-19 11/2020   Diverticulitis 08/2016   Endometriosis of small intestine 09/03/2022   GERD (gastroesophageal reflux disease)    Hepatitis    Teens/20s   History of echocardiogram    Echo (8/15): Vigorous LV function, EF 65-70%, normal wall motion, grade 1 diastolic dysfunction, mild to moderate TR, PASP 31 mm Hg   Hx of cardiovascular stress test    ETT-Myoview (8/15): no ischemia, EF 56%, normal study   Hypertension    Hypothyroid    Pernicious anemia    Pneumonia    2022   PVC's (premature ventricular contractions)    Rheumatoid arthritis (HCC)    Right bundle branch block    S/P colonoscopy 2001, 2004   2001: hyperplastic polyp, 2004: normal, inflammatory polyp   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    Past Surgical History:  Procedure Laterality Date   ABDOMINAL HYSTERECTOMY     BACK SURGERY     BIOPSY  12/08/2021   Procedure: BIOPSY;  Surgeon: Corbin Ade, MD;  Location: [facility];  Service: Endoscopy;;   BREAST BIOPSY Right 10/29/2010   BREAST BIOPSY Right 11/27/2014   BREAST EXCISIONAL BIOPSY Right 02/21/2015   BREAST LUMPECTOMY WITH RADIOACTIVE SEED LOCALIZATION Right 02/21/2015   Procedure: RIGHT BREAST LUMPECTOMY WITH RADIOACTIVE SEED LOCALIZATION;  Surgeon: Emelia Loron, MD;  [facility];  Service: General;  Laterality: Right;   COLONOSCOPY  11/10/2010    Procedure: COLONOSCOPY;  Surgeon: Corbin Ade, MD;  [facility]: [facility];  Service: Endoscopy;  Laterality: N/A;   COLONOSCOPY N/A 09/28/2016   Dr. Jena Gauss; Diverticulosis with evidence of recent diverticulitis.  Next colonoscopy in 2023 given history of tubular adenomas in the past.   COLONOSCOPY WITH PROPOFOL N/A 12/08/2021   Procedure: COLONOSCOPY WITH PROPOFOL;  Surgeon: Corbin Ade, MD;  [facility]: [facility];  Service: Endoscopy;  Laterality: N/A;  11:00am   POLYPECTOMY  12/08/2021   Procedure: POLYPECTOMY;  Surgeon: Corbin Ade, MD;  [facility]: [facility];  Service: Endoscopy;;   PROCTOSCOPY N/A 09/03/2022   Procedure: RIGID PROCTOSCOPY;  Surgeon: Karie Soda, MD;  Location: [facility];  Service: General;  Laterality: N/A;   SUBMUCOSAL TATTOO INJECTION  12/08/2021   Procedure: SUBMUCOSAL TATTOO INJECTION;  Surgeon: Corbin Ade, MD;  [facility]: [facility];  Service: Endoscopy;;   THYROIDECTOMY      OB History     Gravida  3   Para  3   Term      Preterm      AB      Living  3      SAB      IAB      Ectopic      Multiple      Live Births              Social History   Socioeconomic History   Marital  status: Widowed    Spouse name: Not on file   Number of children: Not on file   Years of education: Not on file   Highest education level: Not on file  Occupational History   Not on file  Tobacco Use   Smoking status: Former    Current packs/day: 0.50    Average packs/day: 0.5 packs/day for 35.0 years (17.5 ttl pk-yrs)    Types: Cigarettes   Smokeless tobacco: Former   Tobacco comments:    3-4 cigs per day 07/30/20//lmr  Vaping Use   Vaping status: Never Used  Substance and Sexual Activity   Alcohol use: No   Drug use: No   Sexual activity: Not Currently    Birth control/protection: Surgical    Comment: hyst  Other Topics Concern   Not on file  Social History Narrative   Not on file   Social Determinants of Health   Financial  Resource Strain: Low Risk  (01/25/2023)   Overall Financial Resource Strain (CARDIA)    Difficulty of Paying Living Expenses: Not very hard  Food Insecurity: Patient Declined (01/25/2023)   Hunger Vital Sign    Worried About Running Out of Food in the Last Year: Patient declined    Ran Out of Food in the Last Year: Patient declined  Transportation Needs: No Transportation Needs (01/25/2023)   PRAPARE - Administrator, Civil Service (Medical): No    Lack of Transportation (Non-Medical): No  Physical Activity: Inactive (01/25/2023)   Exercise Vital Sign    Days of Exercise per Week: 0 days    Minutes of Exercise per Session: 0 min  Stress: No Stress Concern Present (01/25/2023)   [employer] of Occupational Health - Occupational Stress Questionnaire    Feeling of Stress : Only a little  Social Connections: Moderately Integrated (01/25/2023)   Social Connection and Isolation Panel [NHANES]    Frequency of Communication with Friends and Family: More than three times a week    Frequency of Social Gatherings with Friends and Family: Twice a week    Attends Religious Services: More than 4 times per year    Active Member of [employer] or Organizations: Yes    Attends Banker Meetings: More than 4 times per year    Marital Status: Widowed    Family History  Problem Relation Age of Onset   Colon cancer Father        diagnosed age 101, died at age 30   Anemia Father    Cancer Father    Hypertension Father    Stomach cancer Mother        deceased   Anemia Mother    Cancer Mother    Hypertension Mother    Thyroid disease Mother    Cancer Brother    Hypertension Brother    Thyroid disease Brother    Anemia Sister    Diabetes Sister    Hypertension Sister    Thyroid disease Sister      Current Outpatient Medications:    acetaminophen (TYLENOL) 500 MG tablet, Take 500 mg by mouth every 6 (six) hours as needed for moderate pain., Disp: , Rfl:    albuterol  (VENTOLIN HFA) 108 (90 Base) MCG/ACT inhaler, Inhale 2 puffs into the lungs every 6 (six) hours as needed for wheezing or shortness of breath., Disp: , Rfl:    aspirin EC 81 MG tablet, Take 81 mg by mouth in the morning. Swallow whole., Disp: , Rfl:  Budeson-Glycopyrrol-Formoterol (BREZTRI AEROSPHERE) 160-9-4.8 MCG/ACT AERO, Inhale 2 puffs into the lungs 2 (two) times daily., Disp: , Rfl:    calcium carbonate (TUMS - DOSED IN MG ELEMENTAL CALCIUM) 500 MG chewable tablet, Chew 2 tablets by mouth daily as needed for indigestion or heartburn., Disp: , Rfl:    cyanocobalamin (,VITAMIN B-12,) 1000 MCG/ML injection, Inject 1,000 mcg into the muscle every 30 (thirty) days., Disp: , Rfl:    diphenhydramine-acetaminophen (TYLENOL PM) 25-500 MG TABS tablet, Take 1-2 tablets by mouth at bedtime as needed (pain.)., Disp: , Rfl:    folic acid (FOLVITE) 1 MG tablet, Take 1 mg by mouth in the morning., Disp: , Rfl:    hydrochlorothiazide (HYDRODIURIL) 25 MG tablet, TAKE (1) TABLET BY MOUTH ONCE DAILY., Disp: 15 tablet, Rfl: 0   levothyroxine (SYNTHROID, LEVOTHROID) 88 MCG tablet, Take 88 mcg by mouth daily before breakfast., Disp: , Rfl:    losartan (COZAAR) 100 MG tablet, Take 100 mg by mouth in the morning., Disp: , Rfl:    methotrexate (RHEUMATREX) 2.5 MG tablet, Take 10 mg by mouth every Monday., Disp: , Rfl:    metoprolol succinate (TOPROL-XL) 25 MG 24 hr tablet, TAKE (1) TABLET BY MOUTH DAILY., Disp: 90 tablet, Rfl: 3   metroNIDAZOLE (METROGEL) 0.75 % vaginal gel, Place 1 Applicatorful vaginally at bedtime., Disp: 70 g, Rfl: 5   Multiple Vitamin (MULTIVITAMIN WITH MINERALS) TABS tablet, Take 1 tablet by mouth in the morning., Disp: , Rfl:    traMADol (ULTRAM) 50 MG tablet, Take 1 - 2 tablets by mouth every 6 hours as needed for moderate pain., Disp: 20 tablet, Rfl: 0   Wheat Dextrin (BENEFIBER) POWD, Take 1 Dose by mouth See admin instructions. Take 1 dose = 2 teaspoons by mouth once to twice daily, Disp: ,  Rfl:   Review of Systems  Review of Systems  Constitutional: Negative for fever, chills, weight loss, malaise/fatigue and diaphoresis.  HENT: Negative for hearing loss, ear pain, nosebleeds, congestion, sore throat, neck pain, tinnitus and ear discharge.   Eyes: Negative for blurred vision, double vision, photophobia, pain, discharge and redness.  Respiratory: Negative for cough, hemoptysis, sputum production, shortness of breath, wheezing and stridor.   Cardiovascular: Negative for chest pain, palpitations, orthopnea, claudication, leg swelling and PND.  Gastrointestinal: negative for abdominal pain. Negative for heartburn, nausea, vomiting, diarrhea, constipation, blood in stool and melena.  Genitourinary: Negative for dysuria, urgency, frequency, hematuria and flank pain.  Musculoskeletal: Negative for myalgias, back pain, joint pain and falls.  Skin: Negative for itching and rash.  Neurological: Negative for dizziness, tingling, tremors, sensory change, speech change, focal weakness, seizures, loss of consciousness, weakness and headaches.  Endo/Heme/Allergies: Negative for environmental allergies and polydipsia. Does not bruise/bleed easily.  Psychiatric/Behavioral: Negative for depression, suicidal ideas, hallucinations, memory loss and substance abuse. The patient is not nervous/anxious and does not have insomnia.        Objective:  Blood pressure (!) 152/89, pulse 68, height 5\' 2"  (1.575 m), weight 133 lb (60.3 kg).   Physical Exam  Vitals reviewed. Constitutional: She is oriented to person, place, and time. She appears well-developed and well-nourished.  HENT:  Head: Normocephalic and atraumatic.        Right Ear: External ear normal.  Left Ear: External ear normal.  Nose: Nose normal.  Mouth/Throat: Oropharynx is clear and moist.  Eyes: Conjunctivae and EOM are normal. Pupils are equal, round, and reactive to light. Right eye exhibits no discharge. Left eye exhibits no  discharge.  No scleral icterus.  Neck: Normal range of motion. Neck supple. No tracheal deviation present. No thyromegaly present.  Cardiovascular: Normal rate, regular rhythm, normal heart sounds and intact distal pulses.  Exam reveals no gallop and no friction rub.   No murmur heard. Respiratory: Effort normal and breath sounds normal. No respiratory distress. She has no wheezes. She has no rales. She exhibits no tenderness.  GI: Soft. Bowel sounds are normal. She exhibits no distension and no mass. There is no tenderness. There is no rebound and no guarding.  Genitourinary:  Breasts no masses skin changes or nipple changes bilaterally      Vulva is normal without lesions Vagina is pink moist with malodorous discharge Cervix surgically absent Uterus is surgically absent Adnexa is negative  Musculoskeletal: Normal range of motion. She exhibits no edema and no tenderness.  Neurological: She is alert and oriented to person, place, and time. She has normal reflexes. She displays normal reflexes. No cranial nerve deficit. She exhibits normal muscle tone. Coordination normal.  Skin: Skin is warm and dry. No rash noted. No erythema. No pallor.  Psychiatric: She has a normal mood and affect. Her behavior is normal. Judgment and thought content normal.       Medications Ordered at today's visit: Meds ordered this encounter  Medications   metroNIDAZOLE (METROGEL) 0.75 % vaginal gel    Sig: Place 1 Applicatorful vaginally at bedtime.    Dispense:  70 g    Refill:  5    Other orders placed at today's visit: No orders of the defined types were placed in this encounter.     Assessment:    Normal Gyn exam.  SK right leg   Plan:    Follow up in: prn weeks.     Return for schedule when patient wants removal of sebarrheic keratosis with Dr Jennette Kettle leg), allow 30 min.

## 2023-01-26 LAB — CERVICOVAGINAL ANCILLARY ONLY
Bacterial Vaginitis (gardnerella): NEGATIVE
Candida Glabrata: NEGATIVE
Candida Vaginitis: NEGATIVE
Comment: NEGATIVE
Comment: NEGATIVE
Comment: NEGATIVE

## 2023-02-04 ENCOUNTER — Other Ambulatory Visit (HOSPITAL_COMMUNITY)
Admission: RE | Admit: 2023-02-04 | Discharge: 2023-02-04 | Disposition: A | Discharge status: Home / Self Care | Payer: Medicare PPO | Source: Ambulatory Visit | Attending: Obstetrics & Gynecology | Admitting: Obstetrics & Gynecology

## 2023-02-04 ENCOUNTER — Ambulatory Visit: Payer: Medicare PPO | Admitting: Obstetrics & Gynecology

## 2023-02-04 ENCOUNTER — Encounter: Payer: Self-pay | Admitting: Obstetrics & Gynecology

## 2023-02-04 VITALS — BP 164/94 | HR 70 | Wt 134.0 lb

## 2023-02-04 DIAGNOSIS — L821 Other seborrheic keratosis: Secondary | ICD-10-CM | POA: Diagnosis not present

## 2023-02-04 NOTE — Progress Notes (Signed)
PROCEDURE NOTE  PRE-OP DIAGNOSIS:  rseborrheic keratosis of the right leg  PROCEDURE:  Skin Lesion Excision(s)  INDICATIONS:  Edrina Araya Nabers is a 72 y.o. female who presents for minor skin surgery.  The patient understands all risks, benefits, indications, potential complications, and alternatives, and freely consents for the procedure.  The patient also understands the option of performing no surgery, the risk for scarring, and the technique of the procedure.  ANESTHESIA:  Local.  TECHNIQUE:  After informed consent was obtained, and after the skin was prepped and draped, 10 cc of 2% lidocaine without epinephrine for anesthetic was injected around and underneath the site.   elliptical excision in total was performed. 3 3-0 ethilon sutures were placed for hemostasis  A dressing was applied and wound care instructions were provided.  Chablis tolerated the procedure well and without complications.  The patient will be alert for any signs of cutaneous infection and will follow up as instructed.  Remove stitches in 10 days or so  Lazaro Arms, MD 02/04/2023 2:38 PM

## 2023-02-04 NOTE — Addendum Note (Signed)
Addended by: Caralyn Guile on: 02/04/2023 03:15 PM   Modules accepted: Orders

## 2023-02-05 ENCOUNTER — Telehealth: Payer: Self-pay | Admitting: Cardiovascular Disease

## 2023-02-05 MED ORDER — HYDROCHLOROTHIAZIDE 25 MG PO TABS: 25.0000 mg | ORAL_TABLET | Freq: Every day | ORAL | 0 refills | Status: DC

## 2023-02-05 NOTE — Telephone Encounter (Signed)
 Sent RX to requested Pharmacy

## 2023-02-05 NOTE — Telephone Encounter (Signed)
*  STAT* If patient is at the pharmacy, call can be transferred to refill team.   1. Which medications need to be refilled? (please list name of each medication and dose if known)   hydrochlorothiazide (HYDRODIURIL) 25 MG tablet      4. Which pharmacy/location (including street and city if local pharmacy) is medication to be sent to? [facility] - [place], [place] - [address]     5. Do they need a 30 day or 90 day supply? 90  Pt scheduled with APP 04/01/23

## 2023-02-08 LAB — SURGICAL PATHOLOGY

## 2023-02-13 DIAGNOSIS — I10 Essential (primary) hypertension: Secondary | ICD-10-CM | POA: Diagnosis not present

## 2023-02-13 DIAGNOSIS — J449 Chronic obstructive pulmonary disease, unspecified: Secondary | ICD-10-CM | POA: Diagnosis not present

## 2023-02-16 ENCOUNTER — Encounter: Payer: Self-pay | Admitting: Obstetrics & Gynecology

## 2023-02-16 ENCOUNTER — Ambulatory Visit: Payer: Medicare PPO | Admitting: Obstetrics & Gynecology

## 2023-02-16 VITALS — BP 158/99 | HR 69 | Ht 62.0 in | Wt 135.0 lb

## 2023-02-16 DIAGNOSIS — Z4802 Encounter for removal of sutures: Secondary | ICD-10-CM

## 2023-02-16 NOTE — Progress Notes (Signed)
Right leg SK removed 2 weeks ago  5 sutures removed Neo sporin placed Bandaged  Well healed  No evidence of infection  Lazaro Arms, MD

## 2023-03-05 ENCOUNTER — Other Ambulatory Visit: Payer: Self-pay | Admitting: Family Medicine

## 2023-03-05 DIAGNOSIS — Z1231 Encounter for screening mammogram for malignant neoplasm of breast: Secondary | ICD-10-CM

## 2023-03-16 DIAGNOSIS — J449 Chronic obstructive pulmonary disease, unspecified: Secondary | ICD-10-CM | POA: Diagnosis not present

## 2023-03-16 DIAGNOSIS — I10 Essential (primary) hypertension: Secondary | ICD-10-CM | POA: Diagnosis not present

## 2023-03-19 NOTE — Congregational Nurse Program (Unsigned)
 Blood pressure elevated Rt 181/110, Lt 170/110. Pt stated taking medication a prescribed. I instructed to inform PCP of the current reading

## 2023-03-25 DIAGNOSIS — Z6824 Body mass index (BMI) 24.0-24.9, adult: Secondary | ICD-10-CM | POA: Diagnosis not present

## 2023-03-25 DIAGNOSIS — Z8679 Personal history of other diseases of the circulatory system: Secondary | ICD-10-CM | POA: Diagnosis not present

## 2023-03-25 DIAGNOSIS — I10 Essential (primary) hypertension: Secondary | ICD-10-CM | POA: Diagnosis not present

## 2023-03-25 DIAGNOSIS — J449 Chronic obstructive pulmonary disease, unspecified: Secondary | ICD-10-CM | POA: Diagnosis not present

## 2023-04-01 ENCOUNTER — Ambulatory Visit: Payer: Medicare PPO | Admitting: Cardiology

## 2023-04-05 DIAGNOSIS — R3129 Other microscopic hematuria: Secondary | ICD-10-CM | POA: Diagnosis not present

## 2023-04-05 DIAGNOSIS — I129 Hypertensive chronic kidney disease with stage 1 through stage 4 chronic kidney disease, or unspecified chronic kidney disease: Secondary | ICD-10-CM | POA: Diagnosis not present

## 2023-04-05 DIAGNOSIS — N1832 Chronic kidney disease, stage 3b: Secondary | ICD-10-CM | POA: Diagnosis not present

## 2023-04-05 DIAGNOSIS — R809 Proteinuria, unspecified: Secondary | ICD-10-CM | POA: Diagnosis not present

## 2023-04-05 DIAGNOSIS — D510 Vitamin B12 deficiency anemia due to intrinsic factor deficiency: Secondary | ICD-10-CM | POA: Diagnosis not present

## 2023-04-05 DIAGNOSIS — M069 Rheumatoid arthritis, unspecified: Secondary | ICD-10-CM | POA: Diagnosis not present

## 2023-04-15 DIAGNOSIS — M8580 Other specified disorders of bone density and structure, unspecified site: Secondary | ICD-10-CM | POA: Diagnosis not present

## 2023-04-15 DIAGNOSIS — J449 Chronic obstructive pulmonary disease, unspecified: Secondary | ICD-10-CM | POA: Diagnosis not present

## 2023-04-15 DIAGNOSIS — M549 Dorsalgia, unspecified: Secondary | ICD-10-CM | POA: Diagnosis not present

## 2023-04-15 DIAGNOSIS — M059 Rheumatoid arthritis with rheumatoid factor, unspecified: Secondary | ICD-10-CM | POA: Diagnosis not present

## 2023-04-15 DIAGNOSIS — N289 Disorder of kidney and ureter, unspecified: Secondary | ICD-10-CM | POA: Diagnosis not present

## 2023-04-15 DIAGNOSIS — M1990 Unspecified osteoarthritis, unspecified site: Secondary | ICD-10-CM | POA: Diagnosis not present

## 2023-04-15 DIAGNOSIS — Z79899 Other long term (current) drug therapy: Secondary | ICD-10-CM | POA: Diagnosis not present

## 2023-05-06 ENCOUNTER — Other Ambulatory Visit: Payer: Self-pay | Admitting: Cardiovascular Disease

## 2023-05-07 NOTE — Telephone Encounter (Signed)
Pt has not been seen since 08/28/2021 and pt has had numerous attempt. Pt had a appt scheduled on 04/01/23, but cancelled appt stating that she did not need to be seen. Would Dr. Eden Emms like to refill pt's medication asking pt to schedule another appt first with 15 day supply or does pt need to be seen first before refilling medication? Please addresss

## 2023-05-10 ENCOUNTER — Other Ambulatory Visit: Payer: Self-pay | Admitting: Cardiovascular Disease

## 2023-05-11 ENCOUNTER — Ambulatory Visit
Admission: RE | Admit: 2023-05-11 | Discharge: 2023-05-11 | Disposition: A | Discharge status: Home / Self Care | Payer: Medicare PPO | Source: Ambulatory Visit | Attending: Family Medicine | Admitting: Family Medicine

## 2023-05-11 DIAGNOSIS — Z1231 Encounter for screening mammogram for malignant neoplasm of breast: Secondary | ICD-10-CM | POA: Diagnosis not present

## 2023-05-24 DIAGNOSIS — Z6825 Body mass index (BMI) 25.0-25.9, adult: Secondary | ICD-10-CM | POA: Diagnosis not present

## 2023-05-24 DIAGNOSIS — R6889 Other general symptoms and signs: Secondary | ICD-10-CM | POA: Diagnosis not present

## 2023-05-24 DIAGNOSIS — J0110 Acute frontal sinusitis, unspecified: Secondary | ICD-10-CM | POA: Diagnosis not present

## 2023-05-24 DIAGNOSIS — E663 Overweight: Secondary | ICD-10-CM | POA: Diagnosis not present

## 2023-05-24 DIAGNOSIS — Z20828 Contact with and (suspected) exposure to other viral communicable diseases: Secondary | ICD-10-CM | POA: Diagnosis not present

## 2023-06-03 DIAGNOSIS — M6281 Muscle weakness (generalized): Secondary | ICD-10-CM | POA: Diagnosis not present

## 2023-06-03 DIAGNOSIS — J449 Chronic obstructive pulmonary disease, unspecified: Secondary | ICD-10-CM | POA: Diagnosis not present

## 2023-06-03 DIAGNOSIS — M1990 Unspecified osteoarthritis, unspecified site: Secondary | ICD-10-CM | POA: Diagnosis not present

## 2023-06-03 DIAGNOSIS — M549 Dorsalgia, unspecified: Secondary | ICD-10-CM | POA: Diagnosis not present

## 2023-06-03 DIAGNOSIS — M059 Rheumatoid arthritis with rheumatoid factor, unspecified: Secondary | ICD-10-CM | POA: Diagnosis not present

## 2023-06-03 DIAGNOSIS — N289 Disorder of kidney and ureter, unspecified: Secondary | ICD-10-CM | POA: Diagnosis not present

## 2023-06-03 DIAGNOSIS — M8580 Other specified disorders of bone density and structure, unspecified site: Secondary | ICD-10-CM | POA: Diagnosis not present

## 2023-06-03 DIAGNOSIS — Z79899 Other long term (current) drug therapy: Secondary | ICD-10-CM | POA: Diagnosis not present

## 2023-06-14 DIAGNOSIS — M069 Rheumatoid arthritis, unspecified: Secondary | ICD-10-CM | POA: Diagnosis not present

## 2023-06-14 DIAGNOSIS — J449 Chronic obstructive pulmonary disease, unspecified: Secondary | ICD-10-CM | POA: Diagnosis not present

## 2023-06-14 DIAGNOSIS — Z6825 Body mass index (BMI) 25.0-25.9, adult: Secondary | ICD-10-CM | POA: Diagnosis not present

## 2023-06-22 DIAGNOSIS — M8580 Other specified disorders of bone density and structure, unspecified site: Secondary | ICD-10-CM | POA: Diagnosis not present

## 2023-06-22 DIAGNOSIS — M1990 Unspecified osteoarthritis, unspecified site: Secondary | ICD-10-CM | POA: Diagnosis not present

## 2023-06-22 DIAGNOSIS — M059 Rheumatoid arthritis with rheumatoid factor, unspecified: Secondary | ICD-10-CM | POA: Diagnosis not present

## 2023-06-22 DIAGNOSIS — Z79899 Other long term (current) drug therapy: Secondary | ICD-10-CM | POA: Diagnosis not present

## 2023-06-22 DIAGNOSIS — M549 Dorsalgia, unspecified: Secondary | ICD-10-CM | POA: Diagnosis not present

## 2023-06-22 DIAGNOSIS — N289 Disorder of kidney and ureter, unspecified: Secondary | ICD-10-CM | POA: Diagnosis not present

## 2023-06-22 DIAGNOSIS — J449 Chronic obstructive pulmonary disease, unspecified: Secondary | ICD-10-CM | POA: Diagnosis not present

## 2023-07-19 DIAGNOSIS — N1832 Chronic kidney disease, stage 3b: Secondary | ICD-10-CM | POA: Diagnosis not present

## 2023-07-26 DIAGNOSIS — I129 Hypertensive chronic kidney disease with stage 1 through stage 4 chronic kidney disease, or unspecified chronic kidney disease: Secondary | ICD-10-CM | POA: Diagnosis not present

## 2023-07-26 DIAGNOSIS — R809 Proteinuria, unspecified: Secondary | ICD-10-CM | POA: Diagnosis not present

## 2023-07-26 DIAGNOSIS — N1832 Chronic kidney disease, stage 3b: Secondary | ICD-10-CM | POA: Diagnosis not present

## 2023-07-26 DIAGNOSIS — R3129 Other microscopic hematuria: Secondary | ICD-10-CM | POA: Diagnosis not present

## 2023-07-26 DIAGNOSIS — D510 Vitamin B12 deficiency anemia due to intrinsic factor deficiency: Secondary | ICD-10-CM | POA: Diagnosis not present

## 2023-07-26 DIAGNOSIS — M069 Rheumatoid arthritis, unspecified: Secondary | ICD-10-CM | POA: Diagnosis not present

## 2023-08-16 DIAGNOSIS — Z6826 Body mass index (BMI) 26.0-26.9, adult: Secondary | ICD-10-CM | POA: Diagnosis not present

## 2023-08-16 DIAGNOSIS — T461X5A Adverse effect of calcium-channel blockers, initial encounter: Secondary | ICD-10-CM | POA: Diagnosis not present

## 2023-08-16 DIAGNOSIS — I10 Essential (primary) hypertension: Secondary | ICD-10-CM | POA: Diagnosis not present

## 2023-08-16 DIAGNOSIS — J449 Chronic obstructive pulmonary disease, unspecified: Secondary | ICD-10-CM | POA: Diagnosis not present

## 2023-08-27 ENCOUNTER — Ambulatory Visit
Admission: RE | Admit: 2023-08-27 | Discharge: 2023-08-27 | Disposition: A | Discharge status: Home / Self Care | Source: Ambulatory Visit | Attending: Family Medicine | Admitting: Family Medicine

## 2023-08-27 VITALS — BP 102/71 | HR 89 | Temp 98.1°F | Resp 18

## 2023-08-27 DIAGNOSIS — R8290 Unspecified abnormal findings in urine: Secondary | ICD-10-CM | POA: Diagnosis not present

## 2023-08-27 DIAGNOSIS — J069 Acute upper respiratory infection, unspecified: Secondary | ICD-10-CM | POA: Diagnosis not present

## 2023-08-27 LAB — POCT URINALYSIS DIP (MANUAL ENTRY)
Bilirubin, UA: NEGATIVE
Glucose, UA: NEGATIVE mg/dL
Ketones, POC UA: NEGATIVE mg/dL
Nitrite, UA: NEGATIVE
Protein Ur, POC: NEGATIVE mg/dL
Spec Grav, UA: 1.01 (ref 1.010–1.025)
Urobilinogen, UA: 0.2 U/dL
pH, UA: 6 (ref 5.0–8.0)

## 2023-08-27 MED ORDER — AZELASTINE HCL 0.1 % NA SOLN: 1.0000 | Freq: Two times a day (BID) | NASAL | 0 refills | Status: DC

## 2023-08-27 MED ORDER — CEPHALEXIN 500 MG PO CAPS: 500.0000 mg | ORAL_CAPSULE | Freq: Two times a day (BID) | ORAL | 0 refills | Status: DC

## 2023-08-27 NOTE — Discharge Instructions (Signed)
 We will let you know if your urine culture tells us  that we need to make any changes to your medication.  I have sent over an antibiotic for a possible urinary tract infection as well as a nasal spray to help with your ear pressure and postnasal drip.  Follow-up for significantly worsening symptoms.

## 2023-08-27 NOTE — ED Provider Notes (Signed)
 [facility]    CSN: [id number] Arrival date & time: 08/27/23  0957      History   Chief Complaint Chief Complaint  Patient presents with   Fatigue    Not a regular appetite, not strong urin stream. Drinking some water  but not able to drink a lot. No energy, fatigue even after sleep - Entered by patient    HPI Emily Valentine is a 73 y.o. female.   Patient presenting today with 1 week history of fatigue, weakness, urinary odor, decreased appetite, postnasal drainage, sore throat, ear pressure, mild cough.  Denies fever, abdominal pain, vomiting, diarrhea, rashes, chest pain, shortness of breath.  So far not trying anything over-the-counter for symptoms.  History of UTIs that have felt similar but states she also has rheumatoid arthritis and is unsure if she is in a flareup.    Past Medical History:  Diagnosis Date   Asthma    Chronic constipation    COVID-19 11/2020   Diverticulitis 08/2016   Endometriosis of small intestine 09/03/2022   GERD (gastroesophageal reflux disease)    Hepatitis    Teens/20s   History of echocardiogram    Echo (8/15): Vigorous LV function, EF 65-70%, normal wall motion, grade 1 diastolic dysfunction, mild to moderate TR, PASP 31 mm Hg   Hx of cardiovascular stress test    ETT-Myoview  (8/15): no ischemia, EF 56%, normal study   Hypertension    Hypothyroid    Pernicious anemia    Pneumonia    2022   PVC's (premature ventricular contractions)    Rheumatoid arthritis (HCC)    Right bundle branch block    S/P colonoscopy 2001, 2004   2001: hyperplastic polyp, 2004: normal, inflammatory polyp   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    Patient Active Problem List   Diagnosis Date Noted   CKD (chronic kidney disease) stage 3, GFR 30-59 ml/min (HCC) 09/04/2022   Endometriosis of small intestine 09/03/2022   Rheumatoid arthritis (HCC) 09/03/2022   Pernicious anemia 09/03/2022   Asthma 09/03/2022   History of  diverticulitis of colon 07/22/2022   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 07/22/2022   Hoarseness 01/29/2022   Impacted cerumen of right ear 01/29/2022   Laryngopharyngeal reflux (LPR) 01/29/2022   Pneumonia due to parainfluenza virus 08/04/2021   Hypokalemia 08/03/2021   Hypomagnesemia 08/03/2021   Hypothyroidism 08/03/2021   Sepsis (HCC)    Hypotension due to hypovolemia    COPD with acute exacerbation (HCC) 07/30/2020   Chronic constipation 04/19/2018   Memory changes 06/26/2017   Mass of right breast 01/17/2015   Perimenopausal vasomotor symptoms 09/05/2014   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   Palpitations 10/04/2013   Right bundle branch block 08/28/2011   Cigarette smoker 08/28/2011   Hypertension 08/28/2011   Encounter for screening colonoscopy 10/14/2010    Past Surgical History:  Procedure Laterality Date   ABDOMINAL HYSTERECTOMY     BACK SURGERY     BIOPSY  12/08/2021   Procedure: BIOPSY;  Surgeon: Suzette Espy, MD;  Location: [facility];  Service: Endoscopy;;   BREAST BIOPSY Right 10/29/2010   BREAST BIOPSY Right 11/27/2014   BREAST EXCISIONAL BIOPSY Right 02/21/2015   BREAST LUMPECTOMY WITH RADIOACTIVE SEED LOCALIZATION Right 02/21/2015   Procedure: RIGHT BREAST LUMPECTOMY WITH RADIOACTIVE SEED LOCALIZATION;  Surgeon: Enid Harry, MD;  Location: [facility];  Service: General;  Laterality: Right;   COLONOSCOPY  11/10/2010   Procedure: COLONOSCOPY;  Surgeon: Suzette Espy,  MD;  Location: [facility];  Service: Endoscopy;  Laterality: N/A;   COLONOSCOPY N/A 09/28/2016   Dr. Riley Cheadle; Diverticulosis with evidence of recent diverticulitis.  Next colonoscopy in 2023 given history of tubular adenomas in the past.   COLONOSCOPY WITH PROPOFOL  N/A 12/08/2021   Procedure: COLONOSCOPY WITH PROPOFOL ;  Surgeon: Suzette Espy, MD;  Location: [facility];  Service: Endoscopy;  Laterality: N/A;  11:00am   POLYPECTOMY  12/08/2021   Procedure:  POLYPECTOMY;  Surgeon: Suzette Espy, MD;  Location: [facility];  Service: Endoscopy;;   PROCTOSCOPY N/A 09/03/2022   Procedure: RIGID PROCTOSCOPY;  Surgeon: Candyce Champagne, MD;  Location: [facility];  Service: General;  Laterality: N/A;   SUBMUCOSAL TATTOO INJECTION  12/08/2021   Procedure: SUBMUCOSAL TATTOO INJECTION;  Surgeon: Suzette Espy, MD;  Location: [facility];  Service: Endoscopy;;   THYROIDECTOMY      OB History     Gravida  3   Para  3   Term      Preterm      AB      Living  3      SAB      IAB      Ectopic      Multiple      Live Births               Home Medications    Prior to Admission medications   Medication Sig Start Date End Date Taking? Authorizing Provider  azelastine (ASTELIN) 0.1 % nasal spray Place 1 spray into both nostrils 2 (two) times daily. Use in each nostril as directed 08/27/23  Yes Corbin Dess, PA-C  cephALEXin  (KEFLEX ) 500 MG capsule Take 1 capsule (500 mg total) by mouth 2 (two) times daily. 08/27/23  Yes Corbin Dess, PA-C  acetaminophen  (TYLENOL ) 500 MG tablet Take 500 mg by mouth every 6 (six) hours as needed for moderate pain.    [provider]  albuterol  (VENTOLIN  HFA) 108 (90 Base) MCG/ACT inhaler Inhale 2 puffs into the lungs every 6 (six) hours as needed for wheezing or shortness of breath.    [provider]  aspirin  EC 81 MG tablet Take 81 mg by mouth in the morning. Swallow whole.    [provider]  Budeson-Glycopyrrol-Formoterol  (BREZTRI AEROSPHERE) 160-9-4.8 MCG/ACT AERO Inhale 2 puffs into the lungs 2 (two) times daily.    [provider]  calcium  carbonate (TUMS - DOSED IN MG ELEMENTAL CALCIUM ) 500 MG chewable tablet Chew 2 tablets by mouth daily as needed for indigestion or heartburn.    [provider]  cyanocobalamin  (,VITAMIN B-12,) 1000 MCG/ML injection Inject 1,000 mcg into the muscle every 30 (thirty) days. Vitamin B12 given Rt buttock  05/30/21   [provider]  diphenhydramine -acetaminophen  (TYLENOL  PM) 25-500 MG TABS tablet Take 1-2 tablets by mouth at bedtime as needed (pain.).    [provider]  folic acid  (FOLVITE ) 1 MG tablet Take 1 mg by mouth in the morning. 03/26/22   [provider]  hydrochlorothiazide  (HYDRODIURIL ) 25 MG tablet Take 1 tablet (25 mg total) by mouth daily. 05/07/23   Nishan, Peter C, MD  levothyroxine  (SYNTHROID , LEVOTHROID) 88 MCG tablet Take 88 mcg by mouth daily before breakfast.    [provider]  losartan  (COZAAR ) 100 MG tablet Take 100 mg by mouth in the morning. 07/23/21   [provider]  methotrexate (RHEUMATREX) 2.5 MG tablet Take 10 mg by mouth every Monday. 05/18/22  [provider]  metoprolol  succinate (TOPROL -XL) 25 MG 24 hr tablet TAKE (1) TABLET BY MOUTH DAILY. 08/09/20   Onetha Bile, NP  Multiple Vitamin (MULTIVITAMIN WITH MINERALS) TABS tablet Take 1 tablet by mouth in the morning.    [provider]  Wheat Dextrin (BENEFIBER) POWD Take 1 Dose by mouth See admin instructions. Take 1 dose = 2 teaspoons by mouth once to twice daily    [provider]    Family History Family History  Problem Relation Age of Onset   Stomach cancer Mother        deceased   Anemia Mother    Cancer Mother    Hypertension Mother    Thyroid  disease Mother    Colon cancer Father        diagnosed age 20, died at age 81   Anemia Father    Cancer Father    Hypertension Father    Anemia Sister    Diabetes Sister    Hypertension Sister    Thyroid  disease Sister    Cancer Brother    Hypertension Brother    Thyroid  disease Brother    Breast cancer Neg Hx     Social History Social History   Tobacco Use   Smoking status: Former    Current packs/day: 0.50    Average packs/day: 0.5 packs/day for 35.0 years (17.5 ttl pk-yrs)    Types: Cigarettes   Smokeless tobacco: Former   Tobacco comments:    3-4 cigs per day  07/30/20//lmr  Vaping Use   Vaping status: Never Used  Substance Use Topics   Alcohol use: No   Drug use: No     Allergies   Codeine and Oxycontin [oxycodone]   Review of Systems Review of Systems PER HPI  Physical Exam Triage Vital Signs ED Triage Vitals  Encounter Vitals Group     BP 08/27/23 1004 102/71     Girls Systolic BP Percentile --      Girls Diastolic BP Percentile --      Boys Systolic BP Percentile --      Boys Diastolic BP Percentile --      Pulse Rate 08/27/23 1004 89     Resp 08/27/23 1004 18     Temp 08/27/23 1004 98.1 F (36.7 C)     Temp Source 08/27/23 1004 Oral     SpO2 08/27/23 1004 92 %     Weight --      Height --      Head Circumference --      Peak Flow --      Pain Score 08/27/23 1005 0     Pain Loc --      Pain Education --      Exclude from Growth Chart --    No data found.  Updated Vital Signs BP 102/71 (BP Location: Right Arm)   Pulse 89   Temp 98.1 F (36.7 C) (Oral)   Resp 18   SpO2 92%   Visual Acuity Right Eye Distance:   Left Eye Distance:   Bilateral Distance:    Right Eye Near:   Left Eye Near:    Bilateral Near:     Physical Exam Vitals and nursing note reviewed.  Constitutional:      Appearance: Normal appearance. She is not ill-appearing.  HENT:     Head: Atraumatic.     Right Ear: Tympanic membrane normal.     Left Ear: Tympanic membrane normal.     Nose:  Nose normal.     Mouth/Throat:     Mouth: Mucous membranes are moist.     Pharynx: Oropharynx is clear. No posterior oropharyngeal erythema.   Eyes:     Extraocular Movements: Extraocular movements intact.     Conjunctiva/sclera: Conjunctivae normal.    Cardiovascular:     Rate and Rhythm: Normal rate.  Pulmonary:     Effort: Pulmonary effort is normal.     Breath sounds: Normal breath sounds. No wheezing or rales.   Musculoskeletal:        General: Normal range of motion.     Cervical back: Normal range of motion and neck supple.    Skin:    General: Skin is warm and dry.   Neurological:     Mental Status: She is alert and oriented to person, place, and time.   Psychiatric:        Mood and Affect: Mood normal.        Thought Content: Thought content normal.        Judgment: Judgment normal.      UC Treatments / Results  Labs (all labs ordered are listed, but only abnormal results are displayed) Labs Reviewed  POCT URINALYSIS DIP (MANUAL ENTRY) - Abnormal; Notable for the following components:      Result Value   Blood, UA moderate (*)    Leukocytes, UA Trace (*)    All other components within normal limits  URINE CULTURE    EKG   Radiology No results found.  Procedures Procedures (including critical care time)  Medications Ordered in UC Medications - No data to display  Initial Impression / Assessment and Plan / UC Course  I have reviewed the triage vital signs and the nursing notes.  Pertinent labs & imaging results that were available during my care of the patient were reviewed by me and considered in my medical decision making (see chart for details).     Vitals and exam overall very reassuring today, she is well-appearing in no acute distress.  Trace leuks and moderate blood in urinalysis today, and given her history of urinary tract infections will cover with Keflex  while awaiting culture for further evaluation.  Astelin for allergic versus viral upper respiratory symptoms.  Discussed pushing fluids, rest, supportive home care additionally.  Return for worsening symptoms.  Final Clinical Impressions(s) / UC Diagnoses   Final diagnoses:  Abnormal urine odor  Viral URI     Discharge Instructions      We will let you know if your urine culture tells us  that we need to make any changes to your medication.  I have sent over an antibiotic for a possible urinary tract infection as well as a nasal spray to help with your ear pressure and postnasal drip.  Follow-up for significantly  worsening symptoms.    ED Prescriptions     Medication Sig Dispense Auth. Provider   azelastine (ASTELIN) 0.1 % nasal spray Place 1 spray into both nostrils 2 (two) times daily. Use in each nostril as directed 30 mL Corbin Dess, PA-C   cephALEXin  (KEFLEX ) 500 MG capsule Take 1 capsule (500 mg total) by mouth 2 (two) times daily. 10 capsule Corbin Dess, [place]      PDMP not reviewed this encounter.   Corbin Dess, [place] 08/27/23 1050

## 2023-08-27 NOTE — ED Triage Notes (Addendum)
 Feeling weak x 1 week.  States urine has had a bad odor.

## 2023-08-28 LAB — URINE CULTURE: Culture: <10000 — AB

## 2023-08-30 ENCOUNTER — Ambulatory Visit (HOSPITAL_COMMUNITY): Payer: Self-pay

## 2023-08-30 ENCOUNTER — Other Ambulatory Visit: Payer: Self-pay

## 2023-08-30 ENCOUNTER — Emergency Department (HOSPITAL_COMMUNITY)

## 2023-08-30 ENCOUNTER — Inpatient Hospital Stay (HOSPITAL_COMMUNITY)
Admission: EM | Admit: 2023-08-30 | Discharge: 2023-09-01 | DRG: 641 | Disposition: A | Discharge status: Home / Self Care | Attending: Family Medicine | Admitting: Family Medicine

## 2023-08-30 DIAGNOSIS — Z860102 Personal history of hyperplastic colon polyps: Secondary | ICD-10-CM

## 2023-08-30 DIAGNOSIS — Z885 Allergy status to narcotic agent status: Secondary | ICD-10-CM

## 2023-08-30 DIAGNOSIS — E878 Other disorders of electrolyte and fluid balance, not elsewhere classified: Secondary | ICD-10-CM | POA: Diagnosis present

## 2023-08-30 DIAGNOSIS — J45909 Unspecified asthma, uncomplicated: Secondary | ICD-10-CM | POA: Diagnosis present

## 2023-08-30 DIAGNOSIS — Z87891 Personal history of nicotine dependence: Secondary | ICD-10-CM | POA: Diagnosis not present

## 2023-08-30 DIAGNOSIS — I4510 Unspecified right bundle-branch block: Secondary | ICD-10-CM | POA: Diagnosis present

## 2023-08-30 DIAGNOSIS — Z79890 Hormone replacement therapy (postmenopausal): Secondary | ICD-10-CM | POA: Diagnosis not present

## 2023-08-30 DIAGNOSIS — Z833 Family history of diabetes mellitus: Secondary | ICD-10-CM

## 2023-08-30 DIAGNOSIS — Z800 Family history of malignant neoplasm of digestive organs: Secondary | ICD-10-CM | POA: Diagnosis not present

## 2023-08-30 DIAGNOSIS — E860 Dehydration: Secondary | ICD-10-CM | POA: Diagnosis present

## 2023-08-30 DIAGNOSIS — Z90710 Acquired absence of both cervix and uterus: Secondary | ICD-10-CM

## 2023-08-30 DIAGNOSIS — M069 Rheumatoid arthritis, unspecified: Secondary | ICD-10-CM | POA: Diagnosis present

## 2023-08-30 DIAGNOSIS — I129 Hypertensive chronic kidney disease with stage 1 through stage 4 chronic kidney disease, or unspecified chronic kidney disease: Secondary | ICD-10-CM | POA: Diagnosis present

## 2023-08-30 DIAGNOSIS — N179 Acute kidney failure, unspecified: Secondary | ICD-10-CM | POA: Diagnosis present

## 2023-08-30 DIAGNOSIS — R531 Weakness: Secondary | ICD-10-CM | POA: Diagnosis not present

## 2023-08-30 DIAGNOSIS — Z79899 Other long term (current) drug therapy: Secondary | ICD-10-CM | POA: Diagnosis not present

## 2023-08-30 DIAGNOSIS — R9431 Abnormal electrocardiogram [ECG] [EKG]: Secondary | ICD-10-CM | POA: Diagnosis present

## 2023-08-30 DIAGNOSIS — E039 Hypothyroidism, unspecified: Secondary | ICD-10-CM | POA: Diagnosis present

## 2023-08-30 DIAGNOSIS — N1832 Chronic kidney disease, stage 3b: Secondary | ICD-10-CM | POA: Diagnosis present

## 2023-08-30 DIAGNOSIS — Z888 Allergy status to other drugs, medicaments and biological substances status: Secondary | ICD-10-CM

## 2023-08-30 DIAGNOSIS — Z8616 Personal history of COVID-19: Secondary | ICD-10-CM | POA: Diagnosis not present

## 2023-08-30 DIAGNOSIS — Z8249 Family history of ischemic heart disease and other diseases of the circulatory system: Secondary | ICD-10-CM

## 2023-08-30 DIAGNOSIS — Z8349 Family history of other endocrine, nutritional and metabolic diseases: Secondary | ICD-10-CM

## 2023-08-30 DIAGNOSIS — K219 Gastro-esophageal reflux disease without esophagitis: Secondary | ICD-10-CM | POA: Diagnosis present

## 2023-08-30 DIAGNOSIS — E876 Hypokalemia: Secondary | ICD-10-CM | POA: Diagnosis present

## 2023-08-30 LAB — COMPREHENSIVE METABOLIC PANEL WITH GFR
ALT: 20 U/L (ref 0–44)
AST: 26 U/L (ref 15–41)
Albumin: 3.8 g/dL (ref 3.5–5.0)
Alkaline Phosphatase: 76 U/L (ref 38–126)
Anion gap: 16 — ABNORMAL HIGH (ref 5–15)
BUN: 45 mg/dL — ABNORMAL HIGH (ref 8–23)
CO2: 30 mmol/L (ref 22–32)
Calcium: 9.7 mg/dL (ref 8.9–10.3)
Chloride: 89 mmol/L — ABNORMAL LOW (ref 98–111)
Creatinine, Ser: 2.62 mg/dL — ABNORMAL HIGH (ref 0.44–1.00)
GFR, Estimated: 19 mL/min — ABNORMAL LOW (ref >60)
Glucose, Bld: 117 mg/dL — ABNORMAL HIGH (ref 70–99)
Potassium: 2.9 mmol/L — ABNORMAL LOW (ref 3.5–5.1)
Sodium: 135 mmol/L (ref 135–145)
Total Bilirubin: 1.2 mg/dL (ref 0.0–1.2)
Total Protein: 8.6 g/dL — ABNORMAL HIGH (ref 6.5–8.1)

## 2023-08-30 LAB — URINALYSIS, ROUTINE W REFLEX MICROSCOPIC
Bilirubin Urine: NEGATIVE
Glucose, UA: NEGATIVE mg/dL
Hgb urine dipstick: NEGATIVE
Ketones, ur: NEGATIVE mg/dL
Leukocytes,Ua: NEGATIVE
Nitrite: NEGATIVE
Protein, ur: NEGATIVE mg/dL
Specific Gravity, Urine: 1.012 (ref 1.005–1.030)
pH: 5 (ref 5.0–8.0)

## 2023-08-30 LAB — CBG MONITORING, ED
Glucose-Capillary: 102 mg/dL — ABNORMAL HIGH (ref 70–99)
Glucose-Capillary: 105 mg/dL — ABNORMAL HIGH (ref 70–99)

## 2023-08-30 LAB — MAGNESIUM: Magnesium: 2.5 mg/dL — ABNORMAL HIGH (ref 1.7–2.4)

## 2023-08-30 LAB — CBC
HCT: 40 % (ref 36.0–46.0)
Hemoglobin: 13.4 g/dL (ref 12.0–15.0)
MCH: 30.1 pg (ref 26.0–34.0)
MCHC: 33.5 g/dL (ref 30.0–36.0)
MCV: 89.9 fL (ref 80.0–100.0)
Platelets: 346 10*3/uL (ref 150–400)
RBC: 4.45 MIL/uL (ref 3.87–5.11)
RDW: 12.9 % (ref 11.5–15.5)
WBC: 9.8 10*3/uL (ref 4.0–10.5)
nRBC: 0 % (ref 0.0–0.2)

## 2023-08-30 LAB — TSH: TSH: 1.409 u[IU]/mL (ref 0.350–4.500)

## 2023-08-30 LAB — TROPONIN I (HIGH SENSITIVITY): Troponin I (High Sensitivity): 25 ng/L — ABNORMAL HIGH (ref <18)

## 2023-08-30 MED ORDER — ACETAMINOPHEN 325 MG PO TABS: 650.0000 mg | ORAL_TABLET | Freq: Four times a day (QID) | ORAL | Status: DC | PRN

## 2023-08-30 MED ORDER — SODIUM CHLORIDE 0.9 % IV BOLUS
500.0000 mL | Freq: Once | INTRAVENOUS | Status: AC
  Administered 2023-08-30: 500 mL via INTRAVENOUS

## 2023-08-30 MED ORDER — ASPIRIN 81 MG PO TBEC
81.0000 mg | DELAYED_RELEASE_TABLET | Freq: Every morning | ORAL | Status: DC
  Administered 2023-08-31 – 2023-09-01 (×2): 81 mg via ORAL
  Filled 2023-08-30 (×2): qty 1

## 2023-08-30 MED ORDER — POTASSIUM CHLORIDE 10 MEQ/100ML IV SOLN: 10.0000 meq | Freq: Once | INTRAVENOUS | Status: DC

## 2023-08-30 MED ORDER — SODIUM CHLORIDE 0.9% FLUSH: 3.0000 mL | INTRAVENOUS | Status: DC | PRN

## 2023-08-30 MED ORDER — BUDESON-GLYCOPYRROL-FORMOTEROL 160-9-4.8 MCG/ACT IN AERO
2.0000 | INHALATION_SPRAY | Freq: Two times a day (BID) | RESPIRATORY_TRACT | Status: DC
  Administered 2023-08-30 – 2023-09-01 (×4): 2 via RESPIRATORY_TRACT
  Filled 2023-08-30: qty 5.9

## 2023-08-30 MED ORDER — ONDANSETRON HCL 4 MG PO TABS: 4.0000 mg | ORAL_TABLET | Freq: Four times a day (QID) | ORAL | Status: DC | PRN

## 2023-08-30 MED ORDER — SODIUM CHLORIDE 0.9 % IV SOLN: INTRAVENOUS | Status: AC | PRN

## 2023-08-30 MED ORDER — POLYETHYLENE GLYCOL 3350 17 G PO PACK: 17.0000 g | PACK | Freq: Every day | ORAL | Status: DC | PRN

## 2023-08-30 MED ORDER — POTASSIUM CHLORIDE CRYS ER 20 MEQ PO TBCR
40.0000 meq | EXTENDED_RELEASE_TABLET | Freq: Once | ORAL | Status: AC
  Administered 2023-08-30: 40 meq via ORAL
  Filled 2023-08-30: qty 2

## 2023-08-30 MED ORDER — SODIUM CHLORIDE 0.9 % IV SOLN: INTRAVENOUS | Status: DC

## 2023-08-30 MED ORDER — ACETAMINOPHEN 650 MG RE SUPP: 650.0000 mg | Freq: Four times a day (QID) | RECTAL | Status: DC | PRN

## 2023-08-30 MED ORDER — ALBUTEROL SULFATE (2.5 MG/3ML) 0.083% IN NEBU: 2.5000 mg | INHALATION_SOLUTION | RESPIRATORY_TRACT | Status: DC | PRN

## 2023-08-30 MED ORDER — METOPROLOL SUCCINATE ER 25 MG PO TB24
25.0000 mg | ORAL_TABLET | Freq: Every day | ORAL | Status: DC
  Administered 2023-08-30 – 2023-09-01 (×3): 25 mg via ORAL
  Filled 2023-08-30 (×3): qty 1

## 2023-08-30 MED ORDER — BISACODYL 10 MG RE SUPP: 10.0000 mg | Freq: Every day | RECTAL | Status: DC | PRN

## 2023-08-30 MED ORDER — HEPARIN SODIUM (PORCINE) 5000 UNIT/ML IJ SOLN
5000.0000 [IU] | Freq: Three times a day (TID) | INTRAMUSCULAR | Status: DC
  Administered 2023-08-30 – 2023-09-01 (×5): 5000 [IU] via SUBCUTANEOUS
  Filled 2023-08-30 (×4): qty 1

## 2023-08-30 MED ORDER — SODIUM CHLORIDE 0.9% FLUSH
3.0000 mL | Freq: Two times a day (BID) | INTRAVENOUS | Status: DC
  Administered 2023-08-30 – 2023-09-01 (×5): 3 mL via INTRAVENOUS

## 2023-08-30 MED ORDER — LEVOTHYROXINE SODIUM 88 MCG PO TABS
88.0000 ug | ORAL_TABLET | Freq: Every day | ORAL | Status: DC
  Administered 2023-08-31 – 2023-09-01 (×2): 88 ug via ORAL
  Filled 2023-08-30 (×2): qty 1

## 2023-08-30 MED ORDER — TRAZODONE HCL 50 MG PO TABS
50.0000 mg | ORAL_TABLET | Freq: Every evening | ORAL | Status: DC | PRN
  Administered 2023-08-30: 50 mg via ORAL
  Filled 2023-08-30: qty 1

## 2023-08-30 MED ORDER — MAGNESIUM SULFATE 2 GM/50ML IV SOLN: 2.0000 g | Freq: Once | INTRAVENOUS | Status: DC

## 2023-08-30 MED ORDER — FOLIC ACID 1 MG PO TABS
1.0000 mg | ORAL_TABLET | Freq: Every morning | ORAL | Status: DC
  Administered 2023-08-31 – 2023-09-01 (×2): 1 mg via ORAL
  Filled 2023-08-30 (×2): qty 1

## 2023-08-30 MED ORDER — ONDANSETRON HCL 4 MG/2ML IJ SOLN: 4.0000 mg | Freq: Four times a day (QID) | INTRAMUSCULAR | Status: DC | PRN

## 2023-08-30 MED ORDER — POTASSIUM CHLORIDE 10 MEQ/100ML IV SOLN
10.0000 meq | INTRAVENOUS | Status: AC
  Administered 2023-08-30 (×4): 10 meq via INTRAVENOUS
  Filled 2023-08-30 (×4): qty 100

## 2023-08-30 NOTE — Progress Notes (Signed)
 Mobility Specialist Progress Note:    08/30/23 1647  Mobility  Activity Ambulated with assistance to bathroom;Ambulated with assistance in room  Level of Assistance Standby assist, set-up cues, supervision of patient - no hands on  Assistive Device None  Distance Ambulated (ft) 15 ft  Range of Motion/Exercises Active;All extremities  Activity Response Tolerated well  Mobility Referral Yes  Mobility visit 1 Mobility  Mobility Specialist Start Time (ACUTE ONLY) 1630  Mobility Specialist Stop Time (ACUTE ONLY) 1645  Mobility Specialist Time Calculation (min) (ACUTE ONLY) 15 min   Pt received requesting assistance to bathroom. Required supervision to stand and ambulate with no AD. Tolerated well, asx throughout. Left pt in bathroom, family in room. Notified NT, all needs met.   Glinda Lapping Mobility Specialist Please contact via Special educational needs teacher or  Rehab office at [phone]

## 2023-08-30 NOTE — ED Provider Notes (Signed)
 [place] [facility] AT [facility] Provider Note   CSN: [id number] Arrival date & time: 08/30/23  1191     Patient presents with: Weakness   Emily Valentine is a 73 y.o. female history including hypertension, hypothyroidism, history of RA, asthma, pernicious anemia and known right bundle branch block, GERD presenting with an approximate 5-day history of generalized weakness which has progressed into nausea with emesis and reduced p.o. intake.  She was seen at a urgent care center on Friday and a UA was positive for few bacteria and leukocytes, was placed on Keflex  pending urine culture which has resulted and is negative for UTI.  In the interim however she endorses increasing generalized weakness, nausea with emesis, stating 3 episodes of vomiting yesterday, none today but has had poor p.o. intake.  She denies fevers or chills, diarrhea, states it has been at least 5 days since her last bowel movement, she does not typically have problems with constipation.  She also reports reduced urinary frequency, describing urinating just dribbles.  She denies abdominal pain or distention, no flank pain.  Denies headache, fever.  She does endorse dizziness described as lightheaded with positional changes.  Pertinent surgical history includes thyroidectomy, abdominal hysterectomy.   The history is provided by the patient.       Prior to Admission medications   Medication Sig Start Date End Date Taking? Authorizing Provider  acetaminophen  (TYLENOL ) 500 MG tablet Take 500 mg by mouth every 6 (six) hours as needed for moderate pain.    [provider]  albuterol  (VENTOLIN  HFA) 108 (90 Base) MCG/ACT inhaler Inhale 2 puffs into the lungs every 6 (six) hours as needed for wheezing or shortness of breath.    [provider]  aspirin  EC 81 MG tablet Take 81 mg by mouth in the morning. Swallow whole.    [provider]  azelastine (ASTELIN) 0.1 % nasal spray  Place 1 spray into both nostrils 2 (two) times daily. Use in each nostril as directed 08/27/23   Corbin Dess, PA-C  Budeson-Glycopyrrol-Formoterol  (BREZTRI AEROSPHERE) 160-9-4.8 MCG/ACT AERO Inhale 2 puffs into the lungs 2 (two) times daily.    [provider]  calcium  carbonate (TUMS - DOSED IN MG ELEMENTAL CALCIUM ) 500 MG chewable tablet Chew 2 tablets by mouth daily as needed for indigestion or heartburn.    [provider]  cephALEXin  (KEFLEX ) 500 MG capsule Take 1 capsule (500 mg total) by mouth 2 (two) times daily. 08/27/23   Corbin Dess, PA-C  cyanocobalamin  (,VITAMIN B-12,) 1000 MCG/ML injection Inject 1,000 mcg into the muscle every 30 (thirty) days. Vitamin B12 given Rt buttock 05/30/21   [provider]  diphenhydramine -acetaminophen  (TYLENOL  PM) 25-500 MG TABS tablet Take 1-2 tablets by mouth at bedtime as needed (pain.).    [provider]  folic acid  (FOLVITE ) 1 MG tablet Take 1 mg by mouth in the morning. 03/26/22   [provider]  hydrochlorothiazide  (HYDRODIURIL ) 25 MG tablet Take 1 tablet (25 mg total) by mouth daily. 05/07/23   Nishan, Peter C, MD  levothyroxine  (SYNTHROID , LEVOTHROID) 88 MCG tablet Take 88 mcg by mouth daily before breakfast.    [provider]  losartan  (COZAAR ) 100 MG tablet Take 100 mg by mouth in the morning. 07/23/21   [provider]  methotrexate (RHEUMATREX) 2.5 MG tablet Take 10 mg by mouth every Monday. 05/18/22   [provider]  metoprolol  succinate (TOPROL -XL) 25 MG 24 hr tablet TAKE (1) TABLET  BY MOUTH DAILY. 08/09/20   Onetha Bile, NP  Multiple Vitamin (MULTIVITAMIN WITH MINERALS) TABS tablet Take 1 tablet by mouth in the morning.    [provider]  Wheat Dextrin (BENEFIBER) POWD Take 1 Dose by mouth See admin instructions. Take 1 dose = 2 teaspoons by mouth once to twice daily    [provider]    Allergies: Codeine and Oxycontin  [oxycodone]    Review of Systems  Constitutional:  Positive for fatigue. Negative for chills and fever.  HENT:  Negative for congestion and sore throat.   Eyes: Negative.   Respiratory:  Negative for chest tightness and shortness of breath.   Cardiovascular:  Negative for chest pain.  Gastrointestinal:  Positive for constipation, nausea and vomiting. Negative for abdominal pain and diarrhea.  Genitourinary:  Positive for decreased urine volume. Negative for dysuria.  Musculoskeletal:  Negative for arthralgias, joint swelling and neck pain.  Skin: Negative.  Negative for rash and wound.  Neurological:  Positive for weakness. Negative for dizziness, light-headedness, numbness and headaches.  Psychiatric/Behavioral: Negative.      Updated Vital Signs BP 120/81 (BP Location: Right Arm)   Pulse 80   Temp 98.3 F (36.8 C) (Oral)   Resp 11   Ht 5' 3 (1.6 m)   Wt 64.9 kg   SpO2 100%   BMI 25.33 kg/m   Physical Exam Vitals and nursing note reviewed.  Constitutional:      Appearance: She is well-developed.  HENT:     Head: Normocephalic and atraumatic.     Mouth/Throat:     Mouth: Mucous membranes are dry.   Eyes:     Conjunctiva/sclera: Conjunctivae normal.    Cardiovascular:     Rate and Rhythm: Normal rate and regular rhythm.     Heart sounds: Normal heart sounds.  Pulmonary:     Effort: Pulmonary effort is normal.     Breath sounds: Normal breath sounds. No wheezing.  Abdominal:     General: Bowel sounds are normal.     Palpations: Abdomen is soft.     Tenderness: There is no abdominal tenderness. There is no guarding.   Musculoskeletal:        General: Normal range of motion.     Cervical back: Normal range of motion.   Skin:    General: Skin is warm and dry.   Neurological:     Mental Status: She is alert.     (all labs ordered are listed, but only abnormal results are displayed) Labs Reviewed  COMPREHENSIVE METABOLIC PANEL WITH GFR - Abnormal; Notable  for the following components:      Result Value   Potassium 2.9 (*)    Chloride 89 (*)    Glucose, Bld 117 (*)    BUN 45 (*)    Creatinine, Ser 2.62 (*)    Total Protein 8.6 (*)    GFR, Estimated 19 (*)    Anion gap 16 (*)    All other components within normal limits  URINALYSIS, ROUTINE W REFLEX MICROSCOPIC - Abnormal; Notable for the following components:   APPearance HAZY (*)    All other components within normal limits  MAGNESIUM  - Abnormal; Notable for the following components:   Magnesium  2.5 (*)    All other components within normal limits  CBG MONITORING, ED - Abnormal; Notable for the following components:   Glucose-Capillary 102 (*)    All other components within normal limits  CBG MONITORING, ED - Abnormal; Notable for  the following components:   Glucose-Capillary 105 (*)    All other components within normal limits  TROPONIN I (HIGH SENSITIVITY) - Abnormal; Notable for the following components:   Troponin I (High Sensitivity) 25 (*)    All other components within normal limits  CBC  TSH    EKG: EKG Interpretation Date/Time:  Monday August 30 2023 09:02:36 EDT Ventricular Rate:  80 PR Interval:  187 QRS Duration:  94 QT Interval:  442 QTC Calculation: 510 R Axis:   -45  Text Interpretation: Sinus rhythm Left axis deviation Prolonged QT interval Right bundle branch block ST elevations in anterior leads similar to prior, slightly increased ST depressions in the inferior leads compared to prior Confirmed by Rosealee Concha ([id number]) on 08/30/2023 9:14:04 AM  Radiology: DG Chest Portable 1 View Result Date: 08/30/2023 CLINICAL DATA:  Weakness EXAM: PORTABLE CHEST 1 VIEW COMPARISON:  Aug 02, 2021 FINDINGS: The heart size and mediastinal contours are within normal limits. Both lungs are clear. The visualized skeletal structures are unremarkable. IMPRESSION: No active disease. Electronically Signed   By: Fredrich Jefferson M.D.   On: 08/30/2023 09:37     Procedures    Medications Ordered in the ED  magnesium  sulfate IVPB 2 g 50 mL (has no administration in time range)  sodium chloride  0.9 % bolus 500 mL (0 mLs Intravenous Stopped 08/30/23 1228)  sodium chloride  0.9 % bolus 500 mL (500 mLs Intravenous New Bag/Given 08/30/23 1319)  potassium chloride  SA (KLOR-CON  M) CR tablet 40 mEq (40 mEq Oral Given 08/30/23 1316)                                    Medical Decision Making Patient presenting with generalized weakness with nausea and vomiting x 5 days, was seen at urgent care 3 days ago and diagnosed with probable UTI, started on Keflex .  Symptoms have worsened, denies dysuria, states she has had reduced urinary output.  Concerning for acute kidney injury versus dehydration, she denies history of kidney problems in the past.  She does have a history of rheumatoid arthritis and is concerned this condition may have flared although she denies orthopedic or joint complaints at this time.  Other differential including metabolic or electrolyte derangement, infection, she denies abdominal pain but does have nausea with emesis, doubt intra-abdominal infection/has negative Murphy sign, doubt cholecystitis, abdominal exam is not consistent with SBO.  Could also represent atypical ACS.  Amount and/or Complexity of Data Reviewed Labs: ordered.    Details: Significant labs including potassium of 2.9, BUN of 45, creatinine at 2.62 reflecting significant AKI.  Her GFR is 19, she has a significant anion gap at 16, glucose is 117, chloride is reduced at 89, CBC normal, her initial troponin is 25, pending delta troponin, although this 25 appears to be baseline in comparison to prior troponins. Radiology: ordered.    Details: Chest x-ray reviewed, no acute findings.  ECG/medicine tests: ordered.    Details: Sinus rhythm with LAD, right bundle branch block which is chronic, rate 80.  Discussion of management or test interpretation with external provider(s): Pt discussed with Dr.  Quintella Buck who accepts pt for admission.  Risk Decision regarding hospitalization.        Final diagnoses:  AKI (acute kidney injury) (HCC)  Hypokalemia  Dehydration  Hypomagnesemia    ED Discharge Orders     None  Katherine Pancake, PA-C 08/30/23 1339    Rosealee Concha, MD 08/30/23 [phone]

## 2023-08-30 NOTE — ED Triage Notes (Signed)
 Pt. BIB [facility] complaining of weakness and dizziness when standing. Pt. Notes has been feeling like this all weekend, been staying in bed. Pt. Seen at urgent care Friday for URI.

## 2023-08-30 NOTE — H&P (Incomplete)
 History and Physical    Patient: Emily Valentine FAO:[id number] DOB: 08/17/1950 DOA: 08/30/2023 DOS: the patient was seen and examined on 08/30/2023 PCP: Minus Amel, MD  Patient coming from: Home  Chief Complaint:  Chief Complaint  Patient presents with   Weakness   HPI: Emily Valentine is a 73 y.o. female with medical history significant past medical history relevant for asthma, GERD, rheumatoid arthritis hypothyroidism, hypertension and CKD 3B presenting by EMS with concerns about generalized weakness fatigue dizziness for almost a week -Patient was seen at urgent care on 08/27/2023 for similar symptoms and concerns about UTI that time.  Patient had nausea vomiting few days ago but none today -No diarrhea -No chest pains , no dyspnea, no leg pains or pleuritic symptoms -Urine culture from 08/27/23 visit [facility] -UA today not suggestive of UTI -Creatinine today is 2.62 with a baseline usually between 1.3 and 1.5, potassium is down to 2.9, patient is noted to be on HCTZ at home -Sodium is 135 with a chloride of 89, LFTs are not elevated, anion gap is 16 bicarb is 30 glucose 117 - TSH WNL - CBC WNL - Magnesium  2.5 - Troponins 25, EKG sinus and nonacute  Review of Systems: As mentioned in the history of present illness. All other systems reviewed and are negative. Past Medical History:  Diagnosis Date   Asthma    Chronic constipation    COVID-19 11/2020   Diverticulitis 08/2016   Endometriosis of small intestine 09/03/2022   GERD (gastroesophageal reflux disease)    Hepatitis    Teens/20s   History of echocardiogram    Echo (8/15): Vigorous LV function, EF 65-70%, normal wall motion, grade 1 diastolic dysfunction, mild to moderate TR, PASP 31 mm Hg   Hx of cardiovascular stress test    ETT-Myoview  (8/15): no ischemia, EF 56%, normal study   Hypertension    Hypothyroid    Pernicious anemia    Pneumonia    2022   PVC's (premature ventricular contractions)    Rheumatoid  arthritis (HCC)    Right bundle branch block    S/P colonoscopy 2001, 2004   2001: hyperplastic polyp, 2004: normal, inflammatory polyp   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   Past Surgical History:  Procedure Laterality Date   ABDOMINAL HYSTERECTOMY     BACK SURGERY     BIOPSY  12/08/2021   Procedure: BIOPSY;  Surgeon: Suzette Espy, MD;  Location: [facility];  Service: Endoscopy;;   BREAST BIOPSY Right 10/29/2010   BREAST BIOPSY Right 11/27/2014   BREAST EXCISIONAL BIOPSY Right 02/21/2015   BREAST LUMPECTOMY WITH RADIOACTIVE SEED LOCALIZATION Right 02/21/2015   Procedure: RIGHT BREAST LUMPECTOMY WITH RADIOACTIVE SEED LOCALIZATION;  Surgeon: Enid Harry, MD;  Location: [facility];  Service: General;  Laterality: Right;   COLONOSCOPY  11/10/2010   Procedure: COLONOSCOPY;  Surgeon: Suzette Espy, MD;  Location: [facility];  Service: Endoscopy;  Laterality: N/A;   COLONOSCOPY N/A 09/28/2016   Dr. Riley Cheadle; Diverticulosis with evidence of recent diverticulitis.  Next colonoscopy in 2023 given history of tubular adenomas in the past.   COLONOSCOPY WITH PROPOFOL  N/A 12/08/2021   Procedure: COLONOSCOPY WITH PROPOFOL ;  Surgeon: Suzette Espy, MD;  Location: [facility];  Service: Endoscopy;  Laterality: N/A;  11:00am   POLYPECTOMY  12/08/2021   Procedure: POLYPECTOMY;  Surgeon: Suzette Espy, MD;  Location: [facility];  Service: Endoscopy;;   PROCTOSCOPY N/A 09/03/2022   Procedure: RIGID  PROCTOSCOPY;  Surgeon: Candyce Champagne, MD;  Location: [facility];  Service: General;  Laterality: N/A;   SUBMUCOSAL TATTOO INJECTION  12/08/2021   Procedure: SUBMUCOSAL TATTOO INJECTION;  Surgeon: Suzette Espy, MD;  Location: [facility];  Service: Endoscopy;;   THYROIDECTOMY     Social History:  reports that she has quit smoking. Her smoking use included cigarettes. She has a 17.5 pack-year smoking history. She has quit using smokeless tobacco. She reports  that she does not drink alcohol and does not use drugs.  Allergies  Allergen Reactions   Codeine Nausea Only   Oxycontin [Oxycodone] Nausea And Vomiting    Family History  Problem Relation Age of Onset   Stomach cancer Mother        deceased   Anemia Mother    Cancer Mother    Hypertension Mother    Thyroid  disease Mother    Colon cancer Father        diagnosed age 44, died at age 41   Anemia Father    Cancer Father    Hypertension Father    Anemia Sister    Diabetes Sister    Hypertension Sister    Thyroid  disease Sister    Cancer Brother    Hypertension Brother    Thyroid  disease Brother    Breast cancer Neg Hx     Prior to Admission medications   Medication Sig Start Date End Date Taking? Authorizing Provider  acetaminophen  (TYLENOL  8 HOUR ARTHRITIS PAIN) 650 MG CR tablet Take 650 mg by mouth every 8 (eight) hours as needed for pain.   Yes [provider]  albuterol  (PROVENTIL ) (2.5 MG/3ML) 0.083% nebulizer solution Take 2.5 mg by nebulization every 4 (four) hours as needed. 08/16/23  Yes [provider]  albuterol  (VENTOLIN  HFA) 108 (90 Base) MCG/ACT inhaler 1 puff as needed Inhalation every 4 hrs   Yes [provider]  aspirin  EC 81 MG tablet Take 81 mg by mouth in the morning. Swallow whole.   Yes [provider]  Budeson-Glycopyrrol-Formoterol  (BREZTRI AEROSPHERE) 160-9-4.8 MCG/ACT AERO Inhale 2 puffs into the lungs 2 (two) times daily.   Yes [provider]  folic acid  (FOLVITE ) 1 MG tablet Take 1 mg by mouth in the morning. 03/26/22  Yes [provider]  furosemide (LASIX) 40 MG tablet Take 40 mg by mouth daily as needed. 08/16/23  Yes [provider]  hydrochlorothiazide  (HYDRODIURIL ) 25 MG tablet Take 25 mg by mouth daily.   Yes [provider]  levothyroxine  (SYNTHROID , LEVOTHROID) 88 MCG tablet Take 88 mcg by mouth daily before breakfast.   Yes [provider]  losartan  (COZAAR ) 100 MG  tablet Take 100 mg by mouth daily.   Yes [provider]  metoprolol  succinate (TOPROL -XL) 25 MG 24 hr tablet TAKE (1) TABLET BY MOUTH DAILY. 08/09/20  Yes Onetha Bile, NP  Multiple Vitamin (MULTI VITAMIN) TABS 1 tablet Orally Once a day   Yes [provider]  amLODipine  (NORVASC ) 10 MG tablet Take 5 mg by mouth every evening.    [provider]  Plecanatide  (TRULANCE ) 3 MG TABS 1 tablet Orally Once a day Patient not taking: Reported on 08/30/2023    [provider]  predniSONE  (DELTASONE ) 10 MG tablet Take 10 mg by mouth daily. Patient not taking: Reported on 08/30/2023    [provider]    Physical Exam: Vitals:   08/30/23 0843 08/30/23 0844 08/30/23 1456  BP: 120/81  119/65  Pulse: 80  92  Resp: 11  19  Temp: 98.3 F (36.8 C)  97.6 F (36.4 C)  TempSrc: Oral  Oral  SpO2: 100%  97%  Weight:  64.9 kg 63.9 kg  Height:  5' 3 (1.6 m) 5' 3 (1.6 m)    Physical Exam  Gen:- Awake Alert, in no acute distress *** HEENT:- [place].AT, No sclera icterus Neck-Supple Neck,No JVD,.  Lungs-  CTAB , fair air movement bilaterally *** CV- S1, S2 normal, RRR Abd-  +ve B.Sounds, Abd Soft, No tenderness,    Extremity/Skin:- No  edema,   good pedal pulses *** Psych-affect is appropriate, oriented x3 Neuro-no new focal deficits, no tremors  Data Reviewed: Urine culture from 08/27/23 visit [facility] -UA today not suggestive of UTI -Creatinine today is 2.62 with a baseline usually between 1.3 and 1.5, potassium is down to 2.9, patient is noted to be on HCTZ at home -Sodium is 135 with a chloride of 89, LFTs are not elevated, anion gap is 16 bicarb is 30 glucose 117 - TSH WNL - CBC WNL - Magnesium  2.5 - Troponins 25, EKG sinus and nonacute  Assessment and Plan: 1)AKI----acute kidney injury on CKD stage -3b    creatinine on admission=  , baseline creatinine =    , creatinine is now=  , renally adjust medications, avoid nephrotoxic agents / dehydration  /  hypotension  2) hypokalemia and hypochloremia--- in the setting of dehydration, emesis and HCTZ use -- Stop HCTZ - Hydrate  3)ASthma --no acute exacerbation, continue bronchodilators  4) hypothyroidism---  5)HTN--stop HCTZ, continue Toprol -XL   Advance Care Planning:   Code Status: Full Code ***  Consults: ***  Family Communication: ***  Severity of Illness: {Observation/Inpatient:21159}  Author: Colin Dawley, MD 08/30/2023 4:44 PM  For on call review [email or web address].

## 2023-08-30 NOTE — Plan of Care (Signed)
   Problem: Education: Goal: Knowledge of General Education information will improve Description Including pain rating scale, medication(s)/side effects and non-pharmacologic comfort measures Outcome: Progressing   Problem: Education: Goal: Knowledge of General Education information will improve Description Including pain rating scale, medication(s)/side effects and non-pharmacologic comfort measures Outcome: Progressing

## 2023-08-31 DIAGNOSIS — N1832 Chronic kidney disease, stage 3b: Secondary | ICD-10-CM | POA: Diagnosis present

## 2023-08-31 DIAGNOSIS — Z90710 Acquired absence of both cervix and uterus: Secondary | ICD-10-CM | POA: Diagnosis not present

## 2023-08-31 DIAGNOSIS — I129 Hypertensive chronic kidney disease with stage 1 through stage 4 chronic kidney disease, or unspecified chronic kidney disease: Secondary | ICD-10-CM | POA: Diagnosis present

## 2023-08-31 DIAGNOSIS — Z79899 Other long term (current) drug therapy: Secondary | ICD-10-CM | POA: Diagnosis not present

## 2023-08-31 DIAGNOSIS — I4510 Unspecified right bundle-branch block: Secondary | ICD-10-CM | POA: Diagnosis present

## 2023-08-31 DIAGNOSIS — E860 Dehydration: Secondary | ICD-10-CM | POA: Diagnosis present

## 2023-08-31 DIAGNOSIS — Z860102 Personal history of hyperplastic colon polyps: Secondary | ICD-10-CM | POA: Diagnosis not present

## 2023-08-31 DIAGNOSIS — N179 Acute kidney failure, unspecified: Secondary | ICD-10-CM | POA: Diagnosis present

## 2023-08-31 DIAGNOSIS — M069 Rheumatoid arthritis, unspecified: Secondary | ICD-10-CM | POA: Diagnosis present

## 2023-08-31 DIAGNOSIS — Z8249 Family history of ischemic heart disease and other diseases of the circulatory system: Secondary | ICD-10-CM | POA: Diagnosis not present

## 2023-08-31 DIAGNOSIS — Z885 Allergy status to narcotic agent status: Secondary | ICD-10-CM | POA: Diagnosis not present

## 2023-08-31 DIAGNOSIS — E878 Other disorders of electrolyte and fluid balance, not elsewhere classified: Secondary | ICD-10-CM | POA: Diagnosis present

## 2023-08-31 DIAGNOSIS — E039 Hypothyroidism, unspecified: Secondary | ICD-10-CM | POA: Diagnosis present

## 2023-08-31 DIAGNOSIS — J45909 Unspecified asthma, uncomplicated: Secondary | ICD-10-CM | POA: Diagnosis present

## 2023-08-31 DIAGNOSIS — Z800 Family history of malignant neoplasm of digestive organs: Secondary | ICD-10-CM | POA: Diagnosis not present

## 2023-08-31 DIAGNOSIS — Z8616 Personal history of COVID-19: Secondary | ICD-10-CM | POA: Diagnosis not present

## 2023-08-31 DIAGNOSIS — E876 Hypokalemia: Secondary | ICD-10-CM | POA: Diagnosis present

## 2023-08-31 DIAGNOSIS — Z8349 Family history of other endocrine, nutritional and metabolic diseases: Secondary | ICD-10-CM | POA: Diagnosis not present

## 2023-08-31 DIAGNOSIS — Z888 Allergy status to other drugs, medicaments and biological substances status: Secondary | ICD-10-CM | POA: Diagnosis not present

## 2023-08-31 DIAGNOSIS — Z87891 Personal history of nicotine dependence: Secondary | ICD-10-CM | POA: Diagnosis not present

## 2023-08-31 DIAGNOSIS — Z79890 Hormone replacement therapy (postmenopausal): Secondary | ICD-10-CM | POA: Diagnosis not present

## 2023-08-31 DIAGNOSIS — Z833 Family history of diabetes mellitus: Secondary | ICD-10-CM | POA: Diagnosis not present

## 2023-08-31 LAB — RENAL FUNCTION PANEL
Albumin: 2.9 g/dL — ABNORMAL LOW (ref 3.5–5.0)
Anion gap: 9 (ref 5–15)
BUN: 33 mg/dL — ABNORMAL HIGH (ref 8–23)
CO2: 25 mmol/L (ref 22–32)
Calcium: 8.4 mg/dL — ABNORMAL LOW (ref 8.9–10.3)
Chloride: 105 mmol/L (ref 98–111)
Creatinine, Ser: 1.77 mg/dL — ABNORMAL HIGH (ref 0.44–1.00)
GFR, Estimated: 30 mL/min — ABNORMAL LOW (ref >60)
Glucose, Bld: 115 mg/dL — ABNORMAL HIGH (ref 70–99)
Phosphorus: 2.2 mg/dL — ABNORMAL LOW (ref 2.5–4.6)
Potassium: 3.5 mmol/L (ref 3.5–5.1)
Sodium: 139 mmol/L (ref 135–145)

## 2023-08-31 LAB — BASIC METABOLIC PANEL WITH GFR
Anion gap: 9 (ref 5–15)
BUN: 33 mg/dL — ABNORMAL HIGH (ref 8–23)
CO2: 26 mmol/L (ref 22–32)
Calcium: 8.5 mg/dL — ABNORMAL LOW (ref 8.9–10.3)
Chloride: 106 mmol/L (ref 98–111)
Creatinine, Ser: 1.79 mg/dL — ABNORMAL HIGH (ref 0.44–1.00)
GFR, Estimated: 30 mL/min — ABNORMAL LOW (ref >60)
Glucose, Bld: 114 mg/dL — ABNORMAL HIGH (ref 70–99)
Potassium: 3.4 mmol/L — ABNORMAL LOW (ref 3.5–5.1)
Sodium: 141 mmol/L (ref 135–145)

## 2023-08-31 MED ORDER — SODIUM CHLORIDE 0.9 % IV SOLN: INTRAVENOUS | Status: AC

## 2023-08-31 MED ORDER — POTASSIUM CHLORIDE CRYS ER 20 MEQ PO TBCR
40.0000 meq | EXTENDED_RELEASE_TABLET | Freq: Once | ORAL | Status: AC
  Administered 2023-08-31: 40 meq via ORAL
  Filled 2023-08-31: qty 2

## 2023-08-31 MED ORDER — POTASSIUM PHOSPHATES 15 MMOLE/5ML IV SOLN
30.0000 mmol | Freq: Once | INTRAVENOUS | Status: AC
  Administered 2023-08-31: 30 mmol via INTRAVENOUS
  Filled 2023-08-31: qty 10

## 2023-08-31 NOTE — Progress Notes (Signed)
 PROGRESS NOTE     Emily Valentine, is a 73 y.o. female, DOB - [birth year], WGN:[id number]  Admit date - 08/30/2023   Admitting Physician Mishon Blubaugh Quintella Buck, MD  Outpatient Primary MD for the patient is Minus Amel, MD  LOS - 0  Chief Complaint  Patient presents with   Weakness        Brief Narrative:  73 y.o. female with medical history significant past medical history relevant for asthma, GERD, rheumatoid arthritis hypothyroidism, hypertension and CKD 3B admitted on 08/30/23--with AKI on CKD with hypokalemia and hypochloremia   -Assessment and Plan:  1)AKI----acute kidney injury on CKD stage -3b    creatinine on admission= 2.62,  -baseline creatinine = 1.3-1.5 -Creatinine is now down to 1.79 with hydration -- Continue to hold Lasix , losartan  and hydrochlorothiazide  -c/n IV fluids -- renally adjust medications, avoid nephrotoxic agents / dehydration  / hypotension   2) hypokalemia/hypophosphatemia and hypochloremia--- in the setting of dehydration, emesis and HCTZ and Lasix use -- Magnesium  2.5 - Troponins 25, EKG sinus and nonacute -- Stopped HCTZ and stopped Lasix - Repeat potassium and phosphorus IV   3)ASthma --no acute exacerbation, continue bronchodilators   4)Hypothyroidism--- continue Levothyroxine    5)HTN--stopped HCTZ, continue Toprol -XL  Status is: Inpatient   Disposition: The patient is from: Home              Anticipated d/c is to: Home              Anticipated d/c date is: 1 day              Patient currently is not medically stable to d/c. Barriers: Not Clinically Stable-   Code Status :  -  Code Status: Full Code   Family Communication:    (patient is alert, awake and coherent)  Discussed with patient's daughter  DVT Prophylaxis  :   - SCDs   heparin  injection 5,000 Units Start: 08/30/23 2200 SCDs Start: 08/30/23 1344 Place TED hose Start: 08/30/23 1344   Lab Results  Component Value Date   PLT 346 08/30/2023   Inpatient  Medications  Scheduled Meds:  aspirin  EC  81 mg Oral q AM   budesonide -glycopyrrolate -formoterol   2 puff Inhalation BID   folic acid   1 mg Oral q AM   heparin   5,000 Units Subcutaneous Q8H   levothyroxine   88 mcg Oral QAC breakfast   metoprolol  succinate  25 mg Oral Daily   sodium chloride  flush  3 mL Intravenous Q12H   sodium chloride  flush  3 mL Intravenous Q12H   Continuous Infusions:  sodium chloride  83 mL/hr at 08/31/23 0844   potassium PHOSPHATE IVPB (in mmol) 30 mmol (08/31/23 1102)   PRN Meds:.acetaminophen  **OR** acetaminophen , albuterol , bisacodyl , ondansetron  **OR** ondansetron  (ZOFRAN ) IV, polyethylene glycol, sodium chloride  flush, traZODone   Anti-infectives (From admission, onward)    None      Subjective: Emily Valentine today has no fevers, no further emesis,  No chest pain,  - Tolerating oral intake well Had BM - Voiding well  Objective: Vitals:   08/30/23 2318 08/31/23 0424 08/31/23 0801 08/31/23 1457  BP: 108/84 103/61  114/72  Pulse: 84 73  77  Resp: 19 20  18   Temp: 97.7 F (36.5 C) 98.4 F (36.9 C)  98.9 F (37.2 C)  TempSrc: Oral Oral  Oral  SpO2: 99% 95% 100% 100%  Weight:      Height:        Intake/Output Summary (Last 24 hours) at 08/31/2023 1613 Last  data filed at 08/31/2023 0400 Gross per 24 hour  Intake 1129.04 ml  Output --  Net 1129.04 ml   Filed Weights   08/30/23 0844 08/30/23 1456  Weight: 64.9 kg 63.9 kg    Physical Exam  Gen:- Awake Alert,  in no apparent distress  HEENT:- [place].AT, No sclera icterus Neck-Supple Neck,No JVD,.  Lungs-  CTAB , fair symmetrical air movement CV- S1, S2 normal, regular  Abd-  +ve B.Sounds, Abd Soft, No tenderness,    Extremity/Skin:- No  edema, pedal pulses present  Psych-affect is appropriate, oriented x3 Neuro-no new focal deficits, no tremors  Data Reviewed: I have personally reviewed following labs and imaging studies  CBC: Recent Labs  Lab 08/30/23 0956  WBC 9.8  HGB 13.4   HCT 40.0  MCV 89.9  PLT 346   Basic Metabolic Panel: Recent Labs  Lab 08/30/23 0956 08/31/23 0417  NA 135 139  141  K 2.9* 3.5  3.4*  CL 89* 105  106  CO2 30 25  26   GLUCOSE 117* 115*  114*  BUN 45* 33*  33*  CREATININE 2.62* 1.77*  1.79*  CALCIUM  9.7 8.4*  8.5*  MG 2.5*  --   PHOS  --  2.2*   GFR: Estimated Creatinine Clearance: 25.2 mL/min (A) (by C-G formula based on SCr of 1.79 mg/dL (H)). Liver Function Tests: Recent Labs  Lab 08/30/23 0956 08/31/23 0417  AST 26  --   ALT 20  --   ALKPHOS 76  --   BILITOT 1.2  --   PROT 8.6*  --   ALBUMIN 3.8 2.9*   Recent Results (from the past 240 hours)  Urine Culture     Status: Abnormal   Collection Time: 08/27/23 10:32 AM   Specimen: Urine, Clean Catch  Result Value Ref Range Status   Specimen Description   Final    URINE, CLEAN CATCH Performed at [facility], [address], [place], [place] [address]    Special Requests   Final    NONE Performed at [facility], [address] [place], [place] [address]    Culture (A)  Final    <10,000 COLONIES/mL INSIGNIFICANT GROWTH Performed at [facility], [address] [place], [place] [address]    Report Status 08/28/2023 FINAL  Final   Radiology Studies: DG Chest Portable 1 View Result Date: 08/30/2023 CLINICAL DATA:  Weakness EXAM: PORTABLE CHEST 1 VIEW COMPARISON:  Aug 02, 2021 FINDINGS: The heart size and mediastinal contours are within normal limits. Both lungs are clear. The visualized skeletal structures are unremarkable. IMPRESSION: No active disease. Electronically Signed   By: Fredrich Jefferson M.D.   On: 08/30/2023 09:37   Scheduled Meds:  aspirin  EC  81 mg Oral q AM   budesonide -glycopyrrolate -formoterol   2 puff Inhalation BID   folic acid   1 mg Oral q AM   heparin   5,000 Units Subcutaneous Q8H   levothyroxine   88 mcg Oral QAC breakfast   metoprolol  succinate  25 mg Oral Daily   sodium chloride  flush  3 mL Intravenous Q12H   sodium  chloride flush  3 mL Intravenous Q12H   Continuous Infusions:  sodium chloride  83 mL/hr at 08/31/23 0844   potassium PHOSPHATE IVPB (in mmol) 30 mmol (08/31/23 1102)    LOS: 0 days   Colin Dawley M.D on 08/31/2023 at 4:13 PM  Go to [email or web address] - for contact info  [facility] - Office  [phone]  If 7PM-7AM, please contact night-coverage  [email or web address] 08/31/2023, 4:13 PM

## 2023-08-31 NOTE — TOC CM/SW Note (Signed)
 Transition of Care [facility]) - Inpatient Brief Assessment   Patient Details  Name: Emily Valentine MRN: [id number] Date of Birth: 08-25-1950  Transition of Care [facility]) CM/SW Contact:    Grandville Lax, LCSWA Phone Number: 08/31/2023, 9:22 AM   Clinical Narrative: Transition of Care Department [facility]) has reviewed patient and no TOC needs have been identified at this time. We will continue to monitor patient advancement through interdiciplinary progression rounds. If new patient transition needs arise, please place a TOC consult.  Transition of Care Asessment: Insurance and Status: Insurance coverage has been reviewed Patient has primary care physician: Yes Home environment has been reviewed: From home Prior level of function:: Independent Prior/Current Home Services: No current home services Social Drivers of Health Review: SDOH reviewed no interventions necessary Readmission risk has been reviewed: Yes Transition of care needs: no transition of care needs at this time

## 2023-08-31 NOTE — Plan of Care (Signed)

## 2023-09-01 DIAGNOSIS — N179 Acute kidney failure, unspecified: Secondary | ICD-10-CM | POA: Diagnosis not present

## 2023-09-01 LAB — RENAL FUNCTION PANEL
Albumin: 2.9 g/dL — ABNORMAL LOW (ref 3.5–5.0)
Anion gap: 8 (ref 5–15)
BUN: 19 mg/dL (ref 8–23)
CO2: 23 mmol/L (ref 22–32)
Calcium: 8.4 mg/dL — ABNORMAL LOW (ref 8.9–10.3)
Chloride: 111 mmol/L (ref 98–111)
Creatinine, Ser: 1.39 mg/dL — ABNORMAL HIGH (ref 0.44–1.00)
GFR, Estimated: 40 mL/min — ABNORMAL LOW (ref >60)
Glucose, Bld: 92 mg/dL (ref 70–99)
Phosphorus: 2.5 mg/dL (ref 2.5–4.6)
Potassium: 3.8 mmol/L (ref 3.5–5.1)
Sodium: 142 mmol/L (ref 135–145)

## 2023-09-01 MED ORDER — FUROSEMIDE 40 MG PO TABS: 40.0000 mg | ORAL_TABLET | Freq: Every day | ORAL | 1 refills | Status: AC | PRN

## 2023-09-01 MED ORDER — METOPROLOL SUCCINATE ER 25 MG PO TB24: ORAL_TABLET | ORAL | 3 refills | Status: AC

## 2023-09-01 MED ORDER — ALBUTEROL SULFATE HFA 108 (90 BASE) MCG/ACT IN AERS: 2.0000 | INHALATION_SPRAY | Freq: Four times a day (QID) | RESPIRATORY_TRACT | 3 refills | Status: AC | PRN

## 2023-09-01 MED ORDER — AMLODIPINE BESYLATE 5 MG PO TABS: 5.0000 mg | ORAL_TABLET | Freq: Every day | ORAL | 5 refills | Status: AC

## 2023-09-01 MED ORDER — ASPIRIN EC 81 MG PO TBEC: 81.0000 mg | DELAYED_RELEASE_TABLET | Freq: Every day | ORAL | 11 refills | Status: AC

## 2023-09-01 MED ORDER — POLYETHYLENE GLYCOL 3350 17 G PO PACK: 17.0000 g | PACK | Freq: Every day | ORAL | 2 refills | Status: AC

## 2023-09-01 MED ORDER — ALBUTEROL SULFATE (2.5 MG/3ML) 0.083% IN NEBU: 2.5000 mg | INHALATION_SOLUTION | RESPIRATORY_TRACT | 11 refills | Status: AC | PRN

## 2023-09-01 MED ORDER — BREZTRI AEROSPHERE 160-9-4.8 MCG/ACT IN AERO: 2.0000 | INHALATION_SPRAY | Freq: Two times a day (BID) | RESPIRATORY_TRACT | 5 refills | Status: AC

## 2023-09-01 NOTE — Discharge Summary (Incomplete)
 Emily Valentine, is a 73 y.o. female  DOB [birth year]  MRN [id number].  Admission date:  08/30/2023  Admitting Physician  Colin Dawley, MD  Discharge Date:  09/01/2023   Primary MD  Minus Amel, MD  Recommendations for primary care physician for things to follow:  1)Very Low-salt diet advised---Less than 2 gm of Sodium per day advised----ok to use Mrs DASH salt substitute instead of Salt 2)Weigh yourself daily,  if you gain more than 3 pounds in 1 day or more than 5 pounds in 1 week then Take Furosemide (Lasix) 40 mg daily for 3 straight days for fluid/swelling ---then Stop 3)Avoid ibuprofen/Advil/Aleve/Motrin/Goody Powders/Naproxen/BC powders/Meloxicam/Diclofenac/Indomethacin and other Nonsteroidal anti-inflammatory medications as these will make you more likely to bleed and can cause stomach ulcers, can also cause Kidney problems.  4)Stop Hydrochlorothiazide   5)Talk to your Rheumatologist about your medication choices for treating your rheumatoid arthritis 6)Avoid constipation--- consider Metamucil Gummies and/or MiraLAX  to keep your bowels regular  Admission Diagnosis  Dehydration [E86.0] Hypokalemia [E87.6] Hypomagnesemia [E83.42] AKI (acute kidney injury) (HCC) [N17.9]  Discharge Diagnosis  Dehydration [E86.0] Hypokalemia [E87.6] Hypomagnesemia [E83.42] AKI (acute kidney injury) (HCC) [N17.9]    Principal Problem:   AKI (acute kidney injury) (HCC)     Past Medical History:  Diagnosis Date   Asthma    Chronic constipation    COVID-19 11/2020   Diverticulitis 08/2016   Endometriosis of small intestine 09/03/2022   GERD (gastroesophageal reflux disease)    Hepatitis    Teens/20s   History of echocardiogram    Echo (8/15): Vigorous LV function, EF 65-70%, normal wall motion, grade 1 diastolic dysfunction, mild to moderate TR, PASP 31 mm Hg   Hx of cardiovascular stress test    ETT-Myoview   (8/15): no ischemia, EF 56%, normal study   Hypertension    Hypothyroid    Pernicious anemia    Pneumonia    2022   PVC's (premature ventricular contractions)    Rheumatoid arthritis (HCC)    Right bundle branch block    S/P colonoscopy 2001, 2004   2001: hyperplastic polyp, 2004: normal, inflammatory polyp   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    Past Surgical History:  Procedure Laterality Date   ABDOMINAL HYSTERECTOMY     BACK SURGERY     BIOPSY  12/08/2021   Procedure: BIOPSY;  Surgeon: Suzette Espy, MD;  [facility];  Service: Endoscopy;;   BREAST BIOPSY Right 10/29/2010   BREAST BIOPSY Right 11/27/2014   BREAST EXCISIONAL BIOPSY Right 02/21/2015   BREAST LUMPECTOMY WITH RADIOACTIVE SEED LOCALIZATION Right 02/21/2015   Procedure: RIGHT BREAST LUMPECTOMY WITH RADIOACTIVE SEED LOCALIZATION;  Surgeon: Enid Harry, MD;  Location: [facility];  Service: General;  Laterality: Right;   COLONOSCOPY  11/10/2010   Procedure: COLONOSCOPY;  Surgeon: Suzette Espy, MD;  [facility]: [facility];  Service: Endoscopy;  Laterality: N/A;   COLONOSCOPY N/A 09/28/2016   Dr. Riley Cheadle; Diverticulosis with evidence of recent diverticulitis.  Next colonoscopy in 2023 given history of  tubular adenomas in the past.   COLONOSCOPY WITH PROPOFOL  N/A 12/08/2021   Procedure: COLONOSCOPY WITH PROPOFOL ;  Surgeon: Suzette Espy, MD;  [facility]: [facility];  Service: Endoscopy;  Laterality: N/A;  11:00am   POLYPECTOMY  12/08/2021   Procedure: POLYPECTOMY;  Surgeon: Suzette Espy, MD;  [facility]: [facility];  Service: Endoscopy;;   PROCTOSCOPY N/A 09/03/2022   Procedure: RIGID PROCTOSCOPY;  Surgeon: Candyce Champagne, MD;  Location: [facility];  Service: General;  Laterality: N/A;   SUBMUCOSAL TATTOO INJECTION  12/08/2021   Procedure: SUBMUCOSAL TATTOO INJECTION;  Surgeon: Suzette Espy, MD;  Location: [facility];  Service: Endoscopy;;   THYROIDECTOMY       HPI  from the history and physical done on the day of admission:   HPI: Emily Valentine is a 73 y.o. female with medical history significant past medical history relevant for asthma, GERD, rheumatoid arthritis hypothyroidism, hypertension and CKD 3B presenting by EMS with concerns about generalized weakness fatigue dizziness for almost a week -Patient was seen at urgent care on 08/27/2023 for similar symptoms and concerns about UTI that time.  Patient had nausea vomiting few days ago but none today -Additional history obtained from patient's daughter at bedside -No diarrhea -No chest pains , no dyspnea, no leg pains or pleuritic symptoms -Urine culture from 08/27/23 visit [facility] -UA today not suggestive of UTI -Creatinine today is 2.62 with a baseline usually between 1.3 and 1.5, potassium is down to 2.9, patient is noted to be on HCTZ at home -Sodium is 135 with a chloride of 89, LFTs are not elevated, anion gap is 16 bicarb is 30 glucose 117 - TSH WNL - CBC WNL - Magnesium  2.5 - Troponins 25, EKG sinus and nonacute   Review of Systems: As mentioned in the history of present illness. All other systems reviewed and are negative.  Hospital Course:     Brief Narrative:  73 y.o. female with medical history significant past medical history relevant for asthma, GERD, rheumatoid arthritis hypothyroidism, hypertension and CKD 3B admitted on 08/30/23--with AKI on CKD with hypokalemia and hypochloremia   -Assessment and Plan:   1)AKI----acute kidney injury on CKD stage -3b    creatinine on admission= 2.62,  -baseline creatinine = 1.3-1.5 -Creatinine is now down to 1.79 with hydration -- Continue to hold Lasix , losartan  and hydrochlorothiazide  -c/n IV fluids -- renally adjust medications, avoid nephrotoxic agents / dehydration  / hypotension   2) hypokalemia/hypophosphatemia and hypochloremia--- in the setting of dehydration, emesis and HCTZ and Lasix use -- Magnesium  2.5 - Troponins 25, EKG  sinus and nonacute -- Stopped HCTZ and stopped Lasix - Repeat potassium and phosphorus IV   3)ASthma --no acute exacerbation, continue bronchodilators   4)Hypothyroidism--- continue Levothyroxine    5)HTN--stopped HCTZ, continue Toprol -XL   Disposition: The patient is from: Home              Anticipated d/c is to: Home  Discharge Condition: ***  Follow UP     Consults obtained - ***  Diet and Activity recommendation:  As advised  Discharge Instructions    **** Discharge Instructions     Call MD for:  difficulty breathing, headache or visual disturbances   Complete by: As directed    Call MD for:  persistant dizziness or light-headedness   Complete by: As directed    Call MD for:  persistant nausea and vomiting   Complete by: As directed    Call MD for:  temperature >  100.4   Complete by: As directed    Diet - low sodium heart healthy   Complete by: As directed    Discharge instructions   Complete by: As directed    1)Very Low-salt diet advised---Less than 2 gm of Sodium per day advised----ok to use Mrs DASH salt substitute instead of Salt 2)Weigh yourself daily,  if you gain more than 3 pounds in 1 day or more than 5 pounds in 1 week then Take Furosemide (Lasix) 40 mg daily for 3 straight days for fluid/swelling ---then Stop 3)Avoid ibuprofen/Advil/Aleve/Motrin/Goody Powders/Naproxen/BC powders/Meloxicam/Diclofenac/Indomethacin and other Nonsteroidal anti-inflammatory medications as these will make you more likely to bleed and can cause stomach ulcers, can also cause Kidney problems.  4)Stop Hydrochlorothiazide   5)Talk to your Rheumatologist about your medication choices for treating your rheumatoid arthritis 6)Avoid constipation--- consider Metamucil Gummies and/or MiraLAX  to keep your bowels regular   Increase activity slowly   Complete by: As directed          Discharge Medications     Allergies as of 09/01/2023       Reactions   Codeine Nausea Only    Oxycontin [oxycodone] Nausea And Vomiting        Medication List     STOP taking these medications    hydrochlorothiazide  25 MG tablet Commonly known as: HYDRODIURIL        TAKE these medications    albuterol  (2.5 MG/3ML) 0.083% nebulizer solution Commonly known as: PROVENTIL  Take 3 mLs (2.5 mg total) by nebulization every 4 (four) hours as needed for wheezing or shortness of breath (cough). What changed: reasons to take this   albuterol  108 (90 Base) MCG/ACT inhaler Commonly known as: VENTOLIN  HFA Inhale 2 puffs into the lungs every 6 (six) hours as needed for wheezing or shortness of breath. What changed: See the new instructions.   amLODipine  5 MG tablet Commonly known as: NORVASC  Take 1 tablet (5 mg total) by mouth daily. What changed:  medication strength when to take this   aspirin  EC 81 MG tablet Take 1 tablet (81 mg total) by mouth daily with breakfast. Swallow whole. What changed: when to take this   Breztri Aerosphere 160-9-4.8 MCG/ACT Aero inhaler Generic drug: budesonide -glycopyrrolate -formoterol  Inhale 2 puffs into the lungs 2 (two) times daily.   folic acid  1 MG tablet Commonly known as: FOLVITE  Take 1 mg by mouth in the morning.   furosemide 40 MG tablet Commonly known as: LASIX Take 1 tablet (40 mg total) by mouth daily as needed for fluid or edema. Take 40 mg daily for 3 straight days ---If you gain > 3 Lb between weigh in or > 5 Lb in a week What changed:  reasons to take this additional instructions   levothyroxine  88 MCG tablet Commonly known as: SYNTHROID  Take 88 mcg by mouth daily before breakfast.   losartan  100 MG tablet Commonly known as: COZAAR  Take 100 mg by mouth daily.   metoprolol  succinate 25 MG 24 hr tablet Commonly known as: TOPROL -XL TAKE (1) TABLET BY MOUTH DAILY.   Multi Vitamin Tabs 1 tablet Orally Once a day   polyethylene glycol 17 g packet Commonly known as: MIRALAX  / GLYCOLAX  Take 17 g by mouth daily.    predniSONE  10 MG tablet Commonly known as: DELTASONE  Take 10 mg by mouth daily.   Trulance  3 MG Tabs Generic drug: Plecanatide  1 tablet Orally Once a day   Tylenol  8 Hour Arthritis Pain 650 MG CR tablet Generic drug: acetaminophen  Take 650 mg by  mouth every 8 (eight) hours as needed for pain.        Major procedures and Radiology Reports - PLEASE review detailed and final reports for all details, in brief -   ***  DG Chest Portable 1 View Result Date: 08/30/2023 CLINICAL DATA:  Weakness EXAM: PORTABLE CHEST 1 VIEW COMPARISON:  Aug 02, 2021 FINDINGS: The heart size and mediastinal contours are within normal limits. Both lungs are clear. The visualized skeletal structures are unremarkable. IMPRESSION: No active disease. Electronically Signed   By: Fredrich Jefferson M.D.   On: 08/30/2023 09:37   Recent Results (from the past 240 hours)  Urine Culture     Status: Abnormal   Collection Time: 08/27/23 10:32 AM   Specimen: Urine, Clean Catch  Result Value Ref Range Status   Specimen Description   Final    URINE, CLEAN CATCH Performed at [facility], [address], [place], [place] [address]    Special Requests   Final    NONE Performed at [facility], [address], [place], [place] [address]    Culture (A)  Final    <10,000 COLONIES/mL INSIGNIFICANT GROWTH Performed at [facility], [address], [place], [place] [address]    Report Status 08/28/2023 FINAL  Final   Today   Subjective    Kollins Brossman today has no ***          Patient has been seen and examined prior to discharge   Objective   Blood pressure 128/71, pulse 85, temperature 98.5 F (36.9 C), temperature source Oral, resp. rate 19, height 5' 3 (1.6 m), weight 63.9 kg, SpO2 100%.   Intake/Output Summary (Last 24 hours) at 09/01/2023 0954 Last data filed at 09/01/2023 0917 Gross per 24 hour  Intake 2510.93 ml  Output --  Net 2510.93 ml    Exam Gen:- Awake Alert, no acute distress  *** HEENT:- [place].AT, No sclera icterus Neck-Supple Neck,No JVD,.  Lungs-  CTAB , good air movement bilaterally CV- S1, S2 normal, regular Abd-  +ve B.Sounds, Abd Soft, No tenderness,    Extremity/Skin:- No  edema,   good pulses Psych-affect is appropriate, oriented x3 Neuro-no new focal deficits, no tremors ***   Data Review   CBC w Diff:  Lab Results  Component Value Date   WBC 9.8 08/30/2023   HGB 13.4 08/30/2023   HCT 40.0 08/30/2023   PLT 346 08/30/2023   LYMPHOPCT 23 08/04/2021   MONOPCT 16 08/04/2021   EOSPCT 3 08/04/2021   BASOPCT 0 08/04/2021   CMP:  Lab Results  Component Value Date   NA 142 09/01/2023   NA 143 02/26/2021   K 3.8 09/01/2023   CL 111 09/01/2023   CO2 23 09/01/2023   BUN 19 09/01/2023   BUN 19 02/26/2021   CREATININE 1.39 (H) 09/01/2023   PROT 8.6 (H) 08/30/2023   PROT 7.1 02/26/2021   ALBUMIN 2.9 (L) 09/01/2023   ALBUMIN 3.8 02/26/2021   BILITOT 1.2 08/30/2023   BILITOT 0.6 02/26/2021   ALKPHOS 76 08/30/2023   AST 26 08/30/2023   ALT 20 08/30/2023   Total Discharge time is about 33 minutes  Colin Dawley M.D on 09/01/2023 at 9:54 AM  Go to [email or web address] -  for contact info  [facility] - Office  [phone]

## 2023-09-01 NOTE — Plan of Care (Signed)

## 2023-09-01 NOTE — Discharge Instructions (Signed)
 1)Very Low-salt diet advised---Less than 2 gm of Sodium per day advised----ok to use Mrs DASH salt substitute instead of Salt 2)Weigh yourself daily,  if you gain more than 3 pounds in 1 day or more than 5 pounds in 1 week then Take Furosemide (Lasix) 40 mg daily for 3 straight days for fluid/swelling ---then Stop 3)Avoid ibuprofen/Advil/Aleve/Motrin/Goody Powders/Naproxen/BC powders/Meloxicam/Diclofenac/Indomethacin and other Nonsteroidal anti-inflammatory medications as these will make you more likely to bleed and can cause stomach ulcers, can also cause Kidney problems.  4)Stop Hydrochlorothiazide   5)Talk to your Rheumatologist about your medication choices for treating your rheumatoid arthritis 6)Avoid constipation--- consider Metamucil Gummies and/or MiraLAX  to keep your bowels regular

## 2023-09-01 NOTE — Plan of Care (Signed)
  Problem: Education: Goal: Knowledge of General Education information will improve Description: Including pain rating scale, medication(s)/side effects and non-pharmacologic comfort measures 09/01/2023 0935 by Goldie Later, RN Outcome: Adequate for Discharge 09/01/2023 0738 by Goldie Later, RN Outcome: Progressing   Problem: Health Behavior/Discharge Planning: Goal: Ability to manage health-related needs will improve 09/01/2023 0935 by Goldie Later, RN Outcome: Adequate for Discharge 09/01/2023 0738 by Goldie Later, RN Outcome: Progressing   Problem: Clinical Measurements: Goal: Ability to maintain clinical measurements within normal limits will improve 09/01/2023 0935 by Goldie Later, RN Outcome: Adequate for Discharge 09/01/2023 0738 by Goldie Later, RN Outcome: Progressing Goal: Will remain free from infection 09/01/2023 0935 by Goldie Later, RN Outcome: Adequate for Discharge 09/01/2023 0738 by Goldie Later, RN Outcome: Progressing Goal: Diagnostic test results will improve 09/01/2023 0935 by Goldie Later, RN Outcome: Adequate for Discharge 09/01/2023 0738 by Goldie Later, RN Outcome: Progressing Goal: Respiratory complications will improve 09/01/2023 0935 by Goldie Later, RN Outcome: Adequate for Discharge 09/01/2023 [phone] by Goldie Later, RN Outcome: Progressing Goal: Cardiovascular complication will be avoided 09/01/2023 0935 by Goldie Later, RN Outcome: Adequate for Discharge 09/01/2023 0738 by Goldie Later, RN Outcome: Progressing   Problem: Activity: Goal: Risk for activity intolerance will decrease 09/01/2023 0935 by Goldie Later, RN Outcome: Adequate for Discharge 09/01/2023 [phone] by Goldie Later, RN Outcome: Progressing   Problem: Nutrition: Goal: Adequate nutrition will be maintained 09/01/2023 0935 by Goldie Later, RN Outcome: Adequate for Discharge 09/01/2023 0738 by Goldie Later, RN Outcome: Progressing   Problem: Coping: Goal: Level of anxiety  will decrease 09/01/2023 0935 by Goldie Later, RN Outcome: Adequate for Discharge 09/01/2023 0738 by Goldie Later, RN Outcome: Progressing   Problem: Elimination: Goal: Will not experience complications related to bowel motility 09/01/2023 0935 by Goldie Later, RN Outcome: Adequate for Discharge 09/01/2023 0738 by Goldie Later, RN Outcome: Progressing Goal: Will not experience complications related to urinary retention 09/01/2023 0935 by Goldie Later, RN Outcome: Adequate for Discharge 09/01/2023 0738 by Goldie Later, RN Outcome: Progressing   Problem: Pain Managment: Goal: General experience of comfort will improve and/or be controlled 09/01/2023 0935 by Goldie Later, RN Outcome: Adequate for Discharge 09/01/2023 0738 by Goldie Later, RN Outcome: Progressing   Problem: Safety: Goal: Ability to remain free from injury will improve 09/01/2023 0935 by Goldie Later, RN Outcome: Adequate for Discharge 09/01/2023 0738 by Goldie Later, RN Outcome: Progressing   Problem: Skin Integrity: Goal: Risk for impaired skin integrity will decrease 09/01/2023 0935 by Goldie Later, RN Outcome: Adequate for Discharge 09/01/2023 0738 by Goldie Later, RN Outcome: Progressing

## 2023-09-02 ENCOUNTER — Telehealth: Payer: Self-pay

## 2023-09-02 NOTE — Transitions of Care (Post Inpatient/ED Visit) (Signed)
 09/02/2023  Name: Emily Valentine MRN: [id number] DOB: [birth year]  Today's TOC FU Call Status: Today's TOC FU Call Status:: Successful TOC FU Call Completed TOC FU Call Complete Date: 09/02/23 Patient's Name and Date of Birth confirmed.  Transition Care Management Follow-up Telephone Call Date of Discharge: 09/01/23 Discharge Facility: [facility] ([facility] Type of Discharge: Inpatient Admission Primary Inpatient Discharge Diagnosis:: AKI How have you been since you were released from the hospital?: Better Any questions or concerns?: No  Items Reviewed: Did you receive and understand the discharge instructions provided?: Yes Medications obtained,verified, and reconciled?: Yes (Medications Reviewed) Any new allergies since your discharge?: No Dietary orders reviewed?: Yes Type of Diet Ordered:: Low sodium heart healthy Do you have support at home?: Yes People in Home [RPT]: child(ren), adult Name of Support/Comfort Primary Source: Emily Valentine  Medications Reviewed Today: Medications Reviewed Today     Reviewed by Claudene Crystal, RN (Case Manager) on 09/02/23 at 1251  Med List Status: <None>   Medication Order Taking? Sig Documenting Provider Last Dose Status Informant  acetaminophen  (TYLENOL  8 HOUR ARTHRITIS PAIN) 650 MG CR tablet [id number] Yes Take 650 mg by mouth every 8 (eight) hours as needed for pain. [provider]  Active   albuterol  (PROVENTIL ) (2.5 MG/3ML) 0.083% nebulizer solution [id number] Yes Take 3 mLs (2.5 mg total) by nebulization every 4 (four) hours as needed for wheezing or shortness of breath (cough). Colin Dawley, MD  Active   albuterol  (VENTOLIN  HFA) 108 (90 Base) MCG/ACT inhaler [id number] Yes Inhale 2 puffs into the lungs every 6 (six) hours as needed for wheezing or shortness of breath. Colin Dawley, MD  Active   amLODipine  (NORVASC ) 5 MG tablet [id number] Yes Take 1 tablet (5 mg total) by mouth daily. Colin Dawley, MD  Active    aspirin  EC 81 MG tablet [id number] Yes Take 1 tablet (81 mg total) by mouth daily with breakfast. Swallow whole. Colin Dawley, MD  Active   budesonide -glycopyrrolate -formoterol  (BREZTRI AEROSPHERE) 160-9-4.8 MCG/ACT AERO inhaler [id number] Yes Inhale 2 puffs into the lungs 2 (two) times daily. Colin Dawley, MD  Active   folic acid  (FOLVITE ) 1 MG tablet [id number] Yes Take 1 mg by mouth in the morning. [provider]  Active Self  furosemide (LASIX) 40 MG tablet [id number] Yes Take 1 tablet (40 mg total) by mouth daily as needed for fluid or edema. Take 40 mg daily for 3 straight days ---If you gain > 3 Lb between weigh in or > 5 Lb in a week Emokpae, Courage, MD  Active   levothyroxine  (SYNTHROID , LEVOTHROID) 88 MCG tablet [id number] Yes Take 88 mcg by mouth daily before breakfast. [provider]  Active Self  losartan  (COZAAR ) 100 MG tablet [id number] Yes Take 100 mg by mouth daily. [provider]  Active   metoprolol  succinate (TOPROL -XL) 25 MG 24 hr tablet [id number] Yes TAKE (1) TABLET BY MOUTH DAILY. Colin Dawley, MD  Active   Multiple Vitamin (MULTI VITAMIN) TABS [id number] Yes 1 tablet Orally Once a day [provider]  Active   Plecanatide  (TRULANCE ) 3 MG TABS [id number] Yes 1 tablet Orally Once a day [provider]  Active   polyethylene glycol (MIRALAX  / GLYCOLAX ) 17 g packet [id number] Yes Take 17 g by mouth daily. Colin Dawley, MD  Active   predniSONE  (DELTASONE ) 10 MG tablet [id number]  Take 10 mg by mouth daily.  Patient not taking: Reported on 08/30/2023   [provider]  Active  Home Care and Equipment/Supplies: Were Home Health Services Ordered?: NA Any new equipment or medical supplies ordered?: NA  Functional Questionnaire: Do you need assistance with bathing/showering or dressing?: No Do you need assistance with meal preparation?: No Do you need assistance with eating?: No Do you have difficulty  maintaining continence: No Do you need assistance with getting out of bed/getting out of a chair/moving?: No Do you have difficulty managing or taking your medications?: Yes (Her daughter assists her)  Follow up appointments reviewed: PCP Follow-up appointment confirmed?: No (The patient's daughter states she will call) MD Provider Line Number:[phone] Given: No Specialist Hospital Follow-up appointment confirmed?: NA Do you need transportation to your follow-up appointment?: No Do you understand care options if your condition(s) worsen?: Yes-patient verbalized understanding  SDOH Interventions Today    Flowsheet Row Most Recent Value  SDOH Interventions   Food Insecurity Interventions Intervention Not Indicated  Housing Interventions Intervention Not Indicated  Transportation Interventions Intervention Not Indicated  Utilities Interventions Intervention Not Indicated    Gareld June, BSN, RN [place]  [facility] - Population Health RN Care Manager [phone]

## 2023-09-08 DIAGNOSIS — E663 Overweight: Secondary | ICD-10-CM | POA: Diagnosis not present

## 2023-09-08 DIAGNOSIS — Z6826 Body mass index (BMI) 26.0-26.9, adult: Secondary | ICD-10-CM | POA: Diagnosis not present

## 2023-09-08 DIAGNOSIS — T461X5A Adverse effect of calcium-channel blockers, initial encounter: Secondary | ICD-10-CM | POA: Diagnosis not present

## 2023-09-08 DIAGNOSIS — E860 Dehydration: Secondary | ICD-10-CM | POA: Diagnosis not present

## 2023-09-13 DIAGNOSIS — E039 Hypothyroidism, unspecified: Secondary | ICD-10-CM | POA: Diagnosis not present

## 2023-09-13 DIAGNOSIS — J449 Chronic obstructive pulmonary disease, unspecified: Secondary | ICD-10-CM | POA: Diagnosis not present

## 2023-10-05 DIAGNOSIS — M059 Rheumatoid arthritis with rheumatoid factor, unspecified: Secondary | ICD-10-CM | POA: Diagnosis not present

## 2023-10-05 DIAGNOSIS — M1990 Unspecified osteoarthritis, unspecified site: Secondary | ICD-10-CM | POA: Diagnosis not present

## 2023-10-05 DIAGNOSIS — M8580 Other specified disorders of bone density and structure, unspecified site: Secondary | ICD-10-CM | POA: Diagnosis not present

## 2023-11-29 DIAGNOSIS — N1832 Chronic kidney disease, stage 3b: Secondary | ICD-10-CM | POA: Diagnosis not present

## 2023-11-29 DIAGNOSIS — N2581 Secondary hyperparathyroidism of renal origin: Secondary | ICD-10-CM | POA: Diagnosis not present

## 2023-11-29 DIAGNOSIS — D510 Vitamin B12 deficiency anemia due to intrinsic factor deficiency: Secondary | ICD-10-CM | POA: Diagnosis not present

## 2023-11-29 DIAGNOSIS — I129 Hypertensive chronic kidney disease with stage 1 through stage 4 chronic kidney disease, or unspecified chronic kidney disease: Secondary | ICD-10-CM | POA: Diagnosis not present

## 2023-11-29 DIAGNOSIS — M069 Rheumatoid arthritis, unspecified: Secondary | ICD-10-CM | POA: Diagnosis not present

## 2023-11-29 DIAGNOSIS — R809 Proteinuria, unspecified: Secondary | ICD-10-CM | POA: Diagnosis not present

## 2023-11-29 DIAGNOSIS — N179 Acute kidney failure, unspecified: Secondary | ICD-10-CM | POA: Diagnosis not present

## 2023-11-29 DIAGNOSIS — R3129 Other microscopic hematuria: Secondary | ICD-10-CM | POA: Diagnosis not present

## 2023-11-30 DIAGNOSIS — N1832 Chronic kidney disease, stage 3b: Secondary | ICD-10-CM | POA: Diagnosis not present

## 2023-12-02 DIAGNOSIS — I129 Hypertensive chronic kidney disease with stage 1 through stage 4 chronic kidney disease, or unspecified chronic kidney disease: Secondary | ICD-10-CM | POA: Diagnosis not present

## 2023-12-02 DIAGNOSIS — Z79899 Other long term (current) drug therapy: Secondary | ICD-10-CM | POA: Diagnosis not present

## 2023-12-02 DIAGNOSIS — J449 Chronic obstructive pulmonary disease, unspecified: Secondary | ICD-10-CM | POA: Diagnosis not present

## 2023-12-02 DIAGNOSIS — N1832 Chronic kidney disease, stage 3b: Secondary | ICD-10-CM | POA: Diagnosis not present

## 2023-12-02 DIAGNOSIS — M059 Rheumatoid arthritis with rheumatoid factor, unspecified: Secondary | ICD-10-CM | POA: Diagnosis not present

## 2023-12-02 DIAGNOSIS — I10 Essential (primary) hypertension: Secondary | ICD-10-CM | POA: Diagnosis not present

## 2023-12-02 DIAGNOSIS — E538 Deficiency of other specified B group vitamins: Secondary | ICD-10-CM | POA: Diagnosis not present

## 2023-12-02 DIAGNOSIS — E039 Hypothyroidism, unspecified: Secondary | ICD-10-CM | POA: Diagnosis not present

## 2023-12-02 DIAGNOSIS — Z79890 Hormone replacement therapy (postmenopausal): Secondary | ICD-10-CM | POA: Diagnosis not present

## 2024-01-31 ENCOUNTER — Encounter: Payer: Self-pay | Admitting: Obstetrics & Gynecology

## 2024-01-31 ENCOUNTER — Ambulatory Visit (INDEPENDENT_AMBULATORY_CARE_PROVIDER_SITE_OTHER): Admitting: Obstetrics & Gynecology

## 2024-01-31 VITALS — BP 119/80 | HR 84 | Ht 62.0 in | Wt 137.5 lb

## 2024-01-31 DIAGNOSIS — I10 Essential (primary) hypertension: Secondary | ICD-10-CM

## 2024-01-31 DIAGNOSIS — Z01419 Encounter for gynecological examination (general) (routine) without abnormal findings: Secondary | ICD-10-CM

## 2024-01-31 NOTE — Progress Notes (Signed)
 Subjective:     Emily Valentine is a 73 y.o. female here for a routine exam.  No LMP recorded. Patient has had a hysterectomy. G3P3 Birth Control Method:  hysterectomy Menstrual Calendar(currently): amenorrhea  Current complaints: none.   Current acute medical issues:  RA, HTN   Recent Gynecologic History No LMP recorded. Patient has had a hysterectomy. Last Pap: na,   Last mammogram: 2/25,  normal  Past Medical History:  Diagnosis Date   Asthma    Chronic constipation    COVID-19 11/2020   Diverticulitis 08/2016   Endometriosis of small intestine 09/03/2022   GERD (gastroesophageal reflux disease)    Hepatitis    Teens/20s   History of echocardiogram    Echo (8/15): Vigorous LV function, EF 65-70%, normal wall motion, grade 1 diastolic dysfunction, mild to moderate TR, PASP 31 mm Hg   Hx of cardiovascular stress test    ETT-Myoview  (8/15): no ischemia, EF 56%, normal study   Hypertension    Hypothyroid    Pernicious anemia    Pneumonia    2022   PVC's (premature ventricular contractions)    Rheumatoid arthritis (HCC)    Right bundle branch block    S/P colonoscopy 2001, 2004   2001: hyperplastic polyp, 2004: normal, inflammatory polyp   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    Past Surgical History:  Procedure Laterality Date   ABDOMINAL HYSTERECTOMY     BACK SURGERY     BIOPSY  12/08/2021   Procedure: BIOPSY;  Surgeon: Shaaron Lamar HERO, MD;  Location: [facility];  Service: Endoscopy;;   BREAST BIOPSY Right 10/29/2010   BREAST BIOPSY Right 11/27/2014   BREAST EXCISIONAL BIOPSY Right 02/21/2015   BREAST LUMPECTOMY WITH RADIOACTIVE SEED LOCALIZATION Right 02/21/2015   Procedure: RIGHT BREAST LUMPECTOMY WITH RADIOACTIVE SEED LOCALIZATION;  Surgeon: Donnice Bury, MD;  Location: [facility];  Service: General;  Laterality: Right;   COLONOSCOPY  11/10/2010   Procedure: COLONOSCOPY;  Surgeon: Lamar HERO Shaaron, MD;  Location: [facility];  Service: Endoscopy;  Laterality: N/A;   COLONOSCOPY N/A 09/28/2016   Dr. Shaaron; Diverticulosis with evidence of recent diverticulitis.  Next colonoscopy in 2023 given history of tubular adenomas in the past.   COLONOSCOPY WITH PROPOFOL  N/A 12/08/2021   Procedure: COLONOSCOPY WITH PROPOFOL ;  Surgeon: Shaaron Lamar HERO, MD;  Location: [facility];  Service: Endoscopy;  Laterality: N/A;  11:00am   POLYPECTOMY  12/08/2021   Procedure: POLYPECTOMY;  Surgeon: Shaaron Lamar HERO, MD;  Location: [facility];  Service: Endoscopy;;   PROCTOSCOPY N/A 09/03/2022   Procedure: RIGID PROCTOSCOPY;  Surgeon: Sheldon Standing, MD;  Location: [facility];  Service: General;  Laterality: N/A;   SUBMUCOSAL TATTOO INJECTION  12/08/2021   Procedure: SUBMUCOSAL TATTOO INJECTION;  Surgeon: Shaaron Lamar HERO, MD;  Location: [facility];  Service: Endoscopy;;   THYROIDECTOMY      OB History     Gravida  3   Para  3   Term      Preterm      AB      Living  3      SAB      IAB      Ectopic      Multiple      Live Births              Social History   Socioeconomic History   Marital status: Widowed    Spouse name: Not on file  Number of children: Not on file   Years of education: Not on file   Highest education level: Not on file  Occupational History   Not on file  Tobacco Use   Smoking status: Former    Current packs/day: 0.50    Average packs/day: 0.5 packs/day for 35.0 years (17.5 ttl pk-yrs)    Types: Cigarettes   Smokeless tobacco: Former   Tobacco comments:    3-4 cigs per day 07/30/20//lmr  Vaping Use   Vaping status: Never Used  Substance and Sexual Activity   Alcohol use: No   Drug use: No   Sexual activity: Not Currently    Birth control/protection: Surgical    Comment: hyst  Other Topics Concern   Not on file  Social History Narrative   Not on file   Social Drivers of Health   Financial Resource Strain: Medium Risk (01/31/2024)   Overall Financial Resource  Strain (CARDIA)    Difficulty of Paying Living Expenses: Somewhat hard  Food Insecurity: Food Insecurity Present (01/31/2024)   Hunger Vital Sign    Worried About Running Out of Food in the Last Year: Never true    Ran Out of Food in the Last Year: Sometimes true  Transportation Needs: No Transportation Needs (01/31/2024)   PRAPARE - Administrator, Civil Service (Medical): No    Lack of Transportation (Non-Medical): No  Physical Activity: Sufficiently Active (01/31/2024)   Exercise Vital Sign    Days of Exercise per Week: 2 days    Minutes of Exercise per Session: 100 min  Stress: No Stress Concern Present (01/31/2024)   [employer] of Occupational Health - Occupational Stress Questionnaire    Feeling of Stress: Not at all  Social Connections: Moderately Integrated (01/31/2024)   Social Connection and Isolation Panel    Frequency of Communication with Friends and Family: Three times a week    Frequency of Social Gatherings with Friends and Family: Twice a week    Attends Religious Services: More than 4 times per year    Active Member of [employer] or Organizations: Yes    Attends Banker Meetings: More than 4 times per year    Marital Status: Widowed    Family History  Problem Relation Age of Onset   Stomach cancer Mother        deceased   Anemia Mother    Cancer Mother    Hypertension Mother    Thyroid  disease Mother    Colon cancer Father        diagnosed age 6, died at age 9   Anemia Father    Cancer Father    Hypertension Father    Anemia Sister    Diabetes Sister    Hypertension Sister    Thyroid  disease Sister    Cancer Brother    Hypertension Brother    Thyroid  disease Brother    Breast cancer Neg Hx      Current Outpatient Medications:    acetaminophen  (TYLENOL  8 HOUR ARTHRITIS PAIN) 650 MG CR tablet, Take 650 mg by mouth every 8 (eight) hours as needed for pain., Disp: , Rfl:    acidophilus (RISAQUAD) CAPS capsule, Take by  mouth daily., Disp: , Rfl:    albuterol  (PROVENTIL ) (2.5 MG/3ML) 0.083% nebulizer solution, Take 3 mLs (2.5 mg total) by nebulization every 4 (four) hours as needed for wheezing or shortness of breath (cough)., Disp: 75 mL, Rfl: 11   albuterol  (VENTOLIN  HFA) 108 (90 Base) MCG/ACT inhaler, Inhale  2 puffs into the lungs every 6 (six) hours as needed for wheezing or shortness of breath., Disp: 51 each, Rfl: 3   aspirin  EC 81 MG tablet, Take 1 tablet (81 mg total) by mouth daily with breakfast. Swallow whole., Disp: 90 tablet, Rfl: 11   budesonide -glycopyrrolate -formoterol  (BREZTRI  AEROSPHERE) 160-9-4.8 MCG/ACT AERO inhaler, Inhale 2 puffs into the lungs 2 (two) times daily., Disp: 10.7 g, Rfl: 5   folic acid  (FOLVITE ) 1 MG tablet, Take 1 mg by mouth in the morning., Disp: , Rfl:    hydroxychloroquine (PLAQUENIL) 200 MG tablet, Take 200 mg by mouth 2 (two) times daily., Disp: , Rfl:    levothyroxine  (SYNTHROID , LEVOTHROID) 88 MCG tablet, Take 88 mcg by mouth daily before breakfast., Disp: , Rfl:    losartan  (COZAAR ) 100 MG tablet, Take 100 mg by mouth daily., Disp: , Rfl:    metoprolol  succinate (TOPROL -XL) 25 MG 24 hr tablet, TAKE (1) TABLET BY MOUTH DAILY., Disp: 90 tablet, Rfl: 3   Multiple Vitamin (MULTI VITAMIN) TABS, 1 tablet Orally Once a day, Disp: , Rfl:    amLODipine  (NORVASC ) 5 MG tablet, Take 1 tablet (5 mg total) by mouth daily., Disp: 30 tablet, Rfl: 5   furosemide  (LASIX ) 40 MG tablet, Take 1 tablet (40 mg total) by mouth daily as needed for fluid or edema. Take 40 mg daily for 3 straight days ---If you gain > 3 Lb between weigh in or > 5 Lb in a week (Patient not taking: Reported on 01/31/2024), Disp: 30 tablet, Rfl: 1   Plecanatide  (TRULANCE ) 3 MG TABS, 1 tablet Orally Once a day (Patient not taking: Reported on 01/31/2024), Disp: , Rfl:    polyethylene glycol (MIRALAX  / GLYCOLAX ) 17 g packet, Take 17 g by mouth daily. (Patient not taking: Reported on 01/31/2024), Disp: 90 each, Rfl: 2    predniSONE  (DELTASONE ) 10 MG tablet, Take 10 mg by mouth daily. (Patient not taking: Reported on 01/31/2024), Disp: , Rfl:   Review of Systems  Review of Systems  Constitutional: Negative for fever, chills, weight loss, malaise/fatigue and diaphoresis.  HENT: Negative for hearing loss, ear pain, nosebleeds, congestion, sore throat, neck pain, tinnitus and ear discharge.   Eyes: Negative for blurred vision, double vision, photophobia, pain, discharge and redness.  Respiratory: Negative for cough, hemoptysis, sputum production, shortness of breath, wheezing and stridor.   Cardiovascular: Negative for chest pain, palpitations, orthopnea, claudication, leg swelling and PND.  Gastrointestinal: negative for abdominal pain. Negative for heartburn, nausea, vomiting, diarrhea, constipation, blood in stool and melena.  Genitourinary: Negative for dysuria, urgency, frequency, hematuria and flank pain.  Musculoskeletal: Negative for myalgias, back pain, joint pain and falls.  Skin: Negative for itching and rash.  Neurological: Negative for dizziness, tingling, tremors, sensory change, speech change, focal weakness, seizures, loss of consciousness, weakness and headaches.  Endo/Heme/Allergies: Negative for environmental allergies and polydipsia. Does not bruise/bleed easily.  Psychiatric/Behavioral: Negative for depression, suicidal ideas, hallucinations, memory loss and substance abuse. The patient is not nervous/anxious and does not have insomnia.        Objective:  Blood pressure 119/80, pulse 84, height 5' 2 (1.575 m), weight 137 lb 8 oz (62.4 kg).   Physical Exam  Vitals reviewed. Constitutional: She is oriented to person, place, and time. She appears well-developed and well-nourished.  HENT:  Head: Normocephalic and atraumatic.        Right Ear: External ear normal.  Left Ear: External ear normal.  Nose: Nose normal.  Mouth/Throat: Oropharynx is clear  and moist.  Eyes: Conjunctivae and EOM  are normal. Pupils are equal, round, and reactive to light. Right eye exhibits no discharge. Left eye exhibits no discharge. No scleral icterus.  Neck: Normal range of motion. Neck supple. No tracheal deviation present. No thyromegaly present.  Cardiovascular: Normal rate, regular rhythm, normal heart sounds and intact distal pulses.  Exam reveals no gallop and no friction rub.   No murmur heard. Respiratory: Effort normal and breath sounds normal. No respiratory distress. She has no wheezes. She has no rales. She exhibits no tenderness.  GI: Soft. Bowel sounds are normal. She exhibits no distension and no mass. There is no tenderness. There is no rebound and no guarding.  Genitourinary:  Breasts no masses skin changes or nipple changes bilaterally      Vulva is normal without lesions Vagina is atrophic no abnormalities Cervix absent Uterus is absent Adnexa is negative   Musculoskeletal: Normal range of motion. She exhibits no edema and no tenderness.  Neurological: She is alert and oriented to person, place, and time. She has normal reflexes. She displays normal reflexes. No cranial nerve deficit. She exhibits normal muscle tone. Coordination normal.  Skin: Skin is warm and dry. No rash noted. No erythema. No pallor.  Psychiatric: She has a normal mood and affect. Her behavior is normal. Judgment and thought content normal.       Medications Ordered at today's visit: No orders of the defined types were placed in this encounter.   Other orders placed at today's visit: No orders of the defined types were placed in this encounter.    ASSESSMENT + PLAN:    ICD-10-CM   1. Well woman exam with routine gynecological exam  Z01.419     2. Essential hypertension: some questions regarding her management, answered  I10           Return in about 3 years (around 01/31/2027) for Follow up, with Dr Jayne.
